# Patient Record
Sex: Male | Born: 1940
Health system: Southern US, Community
[De-identification: ages and names within clinical notes are randomized; demographics above are authoritative.]

## PROBLEM LIST (undated history)

## (undated) DIAGNOSIS — E785 Hyperlipidemia, unspecified: Secondary | ICD-10-CM

## (undated) DIAGNOSIS — D649 Anemia, unspecified: Secondary | ICD-10-CM

## (undated) DIAGNOSIS — R972 Elevated prostate specific antigen [PSA]: Secondary | ICD-10-CM

## (undated) DIAGNOSIS — K5790 Diverticulosis of intestine, part unspecified, without perforation or abscess without bleeding: Secondary | ICD-10-CM

## (undated) HISTORY — DX: Anemia, unspecified: D64.9

## (undated) HISTORY — DX: Gilbert syndrome: E80.4

## (undated) HISTORY — DX: Elevated prostate specific antigen (PSA): R97.20

## (undated) HISTORY — DX: Hyperlipidemia, unspecified: E78.5

## (undated) HISTORY — DX: Diverticulosis of intestine, part unspecified, without perforation or abscess without bleeding: K57.90

---

## 1979-06-25 HISTORY — PX: LUMBAR LAMINECTOMY: SHX95

## 2003-06-25 DIAGNOSIS — K5790 Diverticulosis of intestine, part unspecified, without perforation or abscess without bleeding: Secondary | ICD-10-CM

## 2003-06-25 HISTORY — PX: COLONOSCOPY: SHX174

## 2003-06-25 HISTORY — PX: GANGLION CYST EXCISION: SHX1691

## 2003-06-25 HISTORY — DX: Diverticulosis of intestine, part unspecified, without perforation or abscess without bleeding: K57.90

## 2003-09-27 ENCOUNTER — Encounter (INDEPENDENT_AMBULATORY_CARE_PROVIDER_SITE_OTHER): Payer: Self-pay | Admitting: Specialist

## 2003-09-27 ENCOUNTER — Ambulatory Visit (HOSPITAL_COMMUNITY): Admission: RE | Admit: 2003-09-27 | Discharge: 2003-09-27 | Payer: Self-pay | Admitting: Orthopedic Surgery

## 2003-09-27 ENCOUNTER — Ambulatory Visit (HOSPITAL_BASED_OUTPATIENT_CLINIC_OR_DEPARTMENT_OTHER): Admission: RE | Admit: 2003-09-27 | Discharge: 2003-09-27 | Payer: Self-pay | Admitting: Orthopedic Surgery

## 2004-04-26 ENCOUNTER — Ambulatory Visit: Payer: Self-pay | Admitting: Internal Medicine

## 2005-05-02 ENCOUNTER — Ambulatory Visit: Payer: Self-pay | Admitting: Internal Medicine

## 2005-05-08 ENCOUNTER — Ambulatory Visit: Payer: Self-pay | Admitting: Internal Medicine

## 2005-05-29 ENCOUNTER — Ambulatory Visit: Payer: Self-pay | Admitting: Internal Medicine

## 2005-07-23 ENCOUNTER — Ambulatory Visit: Payer: Self-pay | Admitting: Internal Medicine

## 2005-08-20 ENCOUNTER — Ambulatory Visit: Payer: Self-pay | Admitting: Internal Medicine

## 2006-04-07 ENCOUNTER — Ambulatory Visit: Payer: Self-pay | Admitting: Internal Medicine

## 2006-04-18 ENCOUNTER — Ambulatory Visit: Payer: Self-pay | Admitting: Internal Medicine

## 2006-08-26 ENCOUNTER — Ambulatory Visit: Payer: Self-pay | Admitting: Internal Medicine

## 2006-08-26 LAB — CONVERTED CEMR LAB
Albumin: 3.8 g/dL (ref 3.5–5.2)
Alkaline Phosphatase: 32 units/L — ABNORMAL LOW (ref 39–117)
Total Protein: 6.5 g/dL (ref 6.0–8.3)

## 2007-04-07 ENCOUNTER — Ambulatory Visit: Payer: Self-pay | Admitting: Internal Medicine

## 2007-05-15 ENCOUNTER — Telehealth (INDEPENDENT_AMBULATORY_CARE_PROVIDER_SITE_OTHER): Payer: Self-pay | Admitting: *Deleted

## 2007-06-24 ENCOUNTER — Ambulatory Visit: Payer: Self-pay | Admitting: Internal Medicine

## 2007-06-24 DIAGNOSIS — E1169 Type 2 diabetes mellitus with other specified complication: Secondary | ICD-10-CM | POA: Insufficient documentation

## 2007-06-24 DIAGNOSIS — E782 Mixed hyperlipidemia: Secondary | ICD-10-CM

## 2007-06-24 DIAGNOSIS — R972 Elevated prostate specific antigen [PSA]: Secondary | ICD-10-CM

## 2007-06-24 DIAGNOSIS — K5730 Diverticulosis of large intestine without perforation or abscess without bleeding: Secondary | ICD-10-CM | POA: Insufficient documentation

## 2007-06-24 DIAGNOSIS — R9431 Abnormal electrocardiogram [ECG] [EKG]: Secondary | ICD-10-CM

## 2007-07-15 ENCOUNTER — Encounter (INDEPENDENT_AMBULATORY_CARE_PROVIDER_SITE_OTHER): Payer: Self-pay | Admitting: *Deleted

## 2007-07-15 ENCOUNTER — Ambulatory Visit: Payer: Self-pay | Admitting: Internal Medicine

## 2007-09-22 ENCOUNTER — Ambulatory Visit: Payer: Self-pay | Admitting: Internal Medicine

## 2007-09-22 LAB — CONVERTED CEMR LAB
Bilirubin, Direct: 0.2 mg/dL (ref 0.0–0.3)
Eosinophils Absolute: 0.5 10*3/uL (ref 0.0–0.7)
Eosinophils Relative: 8.1 % — ABNORMAL HIGH (ref 0.0–5.0)
HCT: 39.5 % (ref 39.0–52.0)
HDL: 44.7 mg/dL (ref >39.0)
Hgb A1c MFr Bld: 6 % (ref 4.6–6.0)
MCV: 88.8 fL (ref 78.0–100.0)
Monocytes Absolute: 0.7 10*3/uL (ref 0.1–1.0)
Neutro Abs: 3.3 10*3/uL (ref 1.4–7.7)
Platelets: 208 10*3/uL (ref 150–400)
RDW: 14.9 % — ABNORMAL HIGH (ref 11.5–14.6)
Total Bilirubin: 1 mg/dL (ref 0.3–1.2)
Total Protein: 6.9 g/dL (ref 6.0–8.3)
Triglycerides: 41 mg/dL (ref 0–149)

## 2007-09-29 ENCOUNTER — Ambulatory Visit: Payer: Self-pay | Admitting: Internal Medicine

## 2007-10-02 LAB — CONVERTED CEMR LAB
Folate: >20 ng/mL
Saturation Ratios: 26.7 % (ref 20.0–50.0)
Vitamin B-12: 677 pg/mL (ref 211–911)

## 2007-10-05 ENCOUNTER — Encounter (INDEPENDENT_AMBULATORY_CARE_PROVIDER_SITE_OTHER): Payer: Self-pay | Admitting: *Deleted

## 2007-11-17 ENCOUNTER — Encounter: Payer: Self-pay | Admitting: Internal Medicine

## 2008-03-31 ENCOUNTER — Ambulatory Visit: Payer: Self-pay | Admitting: Internal Medicine

## 2008-04-15 ENCOUNTER — Ambulatory Visit: Payer: Self-pay | Admitting: Internal Medicine

## 2008-04-16 LAB — CONVERTED CEMR LAB
Basophils Absolute: 0 10*3/uL (ref 0.0–0.1)
Eosinophils Absolute: 0.4 10*3/uL (ref 0.0–0.7)
HCT: 40.9 % (ref 39.0–52.0)
MCHC: 35.5 g/dL (ref 30.0–36.0)
Monocytes Absolute: 0.6 10*3/uL (ref 0.1–1.0)
Monocytes Relative: 10.1 % (ref 3.0–12.0)
Neutro Abs: 3.3 10*3/uL (ref 1.4–7.7)
Platelets: 183 10*3/uL (ref 150–400)
RDW: 13 % (ref 11.5–14.6)

## 2008-04-19 ENCOUNTER — Encounter (INDEPENDENT_AMBULATORY_CARE_PROVIDER_SITE_OTHER): Payer: Self-pay | Admitting: *Deleted

## 2008-09-22 ENCOUNTER — Ambulatory Visit: Payer: Self-pay | Admitting: Internal Medicine

## 2008-09-22 DIAGNOSIS — Z862 Personal history of diseases of the blood and blood-forming organs and certain disorders involving the immune mechanism: Secondary | ICD-10-CM | POA: Insufficient documentation

## 2008-09-27 ENCOUNTER — Encounter (INDEPENDENT_AMBULATORY_CARE_PROVIDER_SITE_OTHER): Payer: Self-pay | Admitting: *Deleted

## 2008-09-28 ENCOUNTER — Encounter (INDEPENDENT_AMBULATORY_CARE_PROVIDER_SITE_OTHER): Payer: Self-pay | Admitting: *Deleted

## 2008-10-05 ENCOUNTER — Ambulatory Visit: Payer: Self-pay | Admitting: Internal Medicine

## 2008-10-05 LAB — CONVERTED CEMR LAB
OCCULT 1: NEGATIVE
OCCULT 3: NEGATIVE

## 2008-10-06 ENCOUNTER — Encounter (INDEPENDENT_AMBULATORY_CARE_PROVIDER_SITE_OTHER): Payer: Self-pay | Admitting: *Deleted

## 2008-11-17 ENCOUNTER — Encounter: Payer: Self-pay | Admitting: Internal Medicine

## 2009-03-22 ENCOUNTER — Ambulatory Visit: Payer: Self-pay | Admitting: Internal Medicine

## 2009-10-05 ENCOUNTER — Ambulatory Visit: Payer: Self-pay | Admitting: Internal Medicine

## 2009-10-05 DIAGNOSIS — M720 Palmar fascial fibromatosis [Dupuytren]: Secondary | ICD-10-CM | POA: Insufficient documentation

## 2009-10-24 ENCOUNTER — Ambulatory Visit: Payer: Self-pay | Admitting: Internal Medicine

## 2009-10-24 LAB — CONVERTED CEMR LAB: OCCULT 2: NEGATIVE

## 2009-10-25 ENCOUNTER — Encounter (INDEPENDENT_AMBULATORY_CARE_PROVIDER_SITE_OTHER): Payer: Self-pay | Admitting: *Deleted

## 2009-11-22 ENCOUNTER — Encounter: Payer: Self-pay | Admitting: Internal Medicine

## 2010-01-04 ENCOUNTER — Ambulatory Visit: Payer: Self-pay | Admitting: Internal Medicine

## 2010-01-04 DIAGNOSIS — R42 Dizziness and giddiness: Secondary | ICD-10-CM

## 2010-01-08 LAB — CONVERTED CEMR LAB
Basophils Relative: 0.6 % (ref 0.0–3.0)
Eosinophils Absolute: 0.2 10*3/uL (ref 0.0–0.7)
HCT: 43 % (ref 39.0–52.0)
HDL: 42.6 mg/dL (ref >39.00)
Hemoglobin: 15.3 g/dL (ref 13.0–17.0)
Lymphocytes Relative: 17.3 % (ref 12.0–46.0)
Lymphs Abs: 1.2 10*3/uL (ref 0.7–4.0)
MCHC: 35.5 g/dL (ref 30.0–36.0)
Monocytes Relative: 9.5 % (ref 3.0–12.0)
Neutro Abs: 4.6 10*3/uL (ref 1.4–7.7)
RBC: 4.39 M/uL (ref 4.22–5.81)
Total Bilirubin: 1.4 mg/dL — ABNORMAL HIGH (ref 0.3–1.2)
Total CHOL/HDL Ratio: 3
VLDL: 11 mg/dL (ref 0.0–40.0)

## 2010-01-16 ENCOUNTER — Encounter: Payer: Self-pay | Admitting: Internal Medicine

## 2010-03-27 ENCOUNTER — Ambulatory Visit: Payer: Self-pay | Admitting: Internal Medicine

## 2010-06-13 ENCOUNTER — Telehealth (INDEPENDENT_AMBULATORY_CARE_PROVIDER_SITE_OTHER): Payer: Self-pay | Admitting: *Deleted

## 2010-07-22 LAB — CONVERTED CEMR LAB
ALT: 20 units/L (ref 0–53)
ALT: 22 units/L (ref 0–53)
AST: 32 units/L (ref 0–37)
Albumin: 4 g/dL (ref 3.5–5.2)
Alkaline Phosphatase: 35 units/L — ABNORMAL LOW (ref 39–117)
BUN: 20 mg/dL (ref 6–23)
Basophils Absolute: 0 10*3/uL (ref 0.0–0.1)
Basophils Relative: 0.5 % (ref 0.0–3.0)
Bilirubin, Direct: 0.2 mg/dL (ref 0.0–0.3)
Bilirubin, Direct: 0.3 mg/dL (ref 0.0–0.3)
Blood in Urine, dipstick: NEGATIVE
CO2: 29 meq/L (ref 19–32)
Calcium: 9 mg/dL (ref 8.4–10.5)
Chloride: 106 meq/L (ref 96–112)
Cholesterol: 115 mg/dL (ref 0–200)
Creatinine, Ser: 1.1 mg/dL (ref 0.4–1.5)
Eosinophils Absolute: 0.3 10*3/uL (ref 0.0–0.6)
Eosinophils Absolute: 0.3 10*3/uL (ref 0.0–0.7)
Eosinophils Relative: 3.5 % (ref 0.0–5.0)
Eosinophils Relative: 4.2 % (ref 0.0–5.0)
Eosinophils Relative: 5.9 % — ABNORMAL HIGH (ref 0.0–5.0)
GFR calc Af Amer: 86 mL/min
GFR calc non Af Amer: 63.39 mL/min (ref >60)
GFR calc non Af Amer: 71 mL/min
Glucose, Bld: 102 mg/dL — ABNORMAL HIGH (ref 70–99)
Glucose, Bld: 107 mg/dL — ABNORMAL HIGH (ref 70–99)
Glucose, Urine, Semiquant: NEGATIVE
HCT: 34.7 % — ABNORMAL LOW (ref 39.0–52.0)
HCT: 40.8 % (ref 39.0–52.0)
HCT: 41 % (ref 39.0–52.0)
HDL goal, serum: 40 mg/dL
HDL: 46.2 mg/dL (ref >39.00)
Hemoglobin: 14.1 g/dL (ref 13.0–17.0)
Hgb A1c MFr Bld: 5.7 % (ref 4.6–6.5)
LDL Goal: 160 mg/dL
Lymphocytes Relative: 12.6 % (ref 12.0–46.0)
Lymphocytes Relative: 22.3 % (ref 12.0–46.0)
Lymphs Abs: 1.1 10*3/uL (ref 0.7–4.0)
Lymphs Abs: 1.3 10*3/uL (ref 0.7–4.0)
MCHC: 35.3 g/dL (ref 30.0–36.0)
MCV: 87.6 fL (ref 78.0–100.0)
MCV: 98.1 fL (ref 78.0–100.0)
Monocytes Absolute: 0.7 10*3/uL (ref 0.1–1.0)
Monocytes Relative: 9.2 % (ref 3.0–12.0)
Neutro Abs: 3.2 10*3/uL (ref 1.4–7.7)
Neutro Abs: 6.4 10*3/uL (ref 1.4–7.7)
Neutrophils Relative %: 58.3 % (ref 43.0–77.0)
Neutrophils Relative %: 64.2 % (ref 43.0–77.0)
Nitrite: NEGATIVE
Platelets: 198 10*3/uL (ref 150.0–400.0)
Platelets: 216 10*3/uL (ref 150–400)
Potassium: 4.2 meq/L (ref 3.5–5.1)
Potassium: 4.2 meq/L (ref 3.5–5.1)
Potassium: 4.7 meq/L (ref 3.5–5.1)
Protein, U semiquant: NEGATIVE
RBC: 4.1 M/uL — ABNORMAL LOW (ref 4.22–5.81)
RDW: 13.3 % (ref 11.5–14.6)
Sodium: 143 meq/L (ref 135–145)
Sodium: 143 meq/L (ref 135–145)
TSH: 1.56 microintl units/mL (ref 0.35–5.50)
TSH: 1.79 microintl units/mL (ref 0.35–5.50)
Total Bilirubin: 1.6 mg/dL — ABNORMAL HIGH (ref 0.3–1.2)
Total CHOL/HDL Ratio: 2
Total Protein: 7 g/dL (ref 6.0–8.3)
Triglycerides: 37 mg/dL (ref 0.0–149.0)
Triglycerides: 59 mg/dL (ref 0–149)
VLDL: 11.6 mg/dL (ref 0.0–40.0)
WBC Urine, dipstick: NEGATIVE
WBC: 5.5 10*3/uL (ref 4.5–10.5)
WBC: 8.6 10*3/uL (ref 4.5–10.5)
pH: 5

## 2010-07-26 NOTE — Letter (Signed)
Summary: Results Follow up Letter  [facility]  [address] [place], [place] [address]   Phone: [phone]  Fax: [phone]    10/25/2009 MRN: [id number]  Walter Hess [address] [place], [place]  [address]  Dear Walter Hess,  The following are the results of your recent test(s):  Test         Result    Pap Smear:        Normal _____  Not Normal _____ Comments: ______________________________________________________ Cholesterol: LDL(Bad cholesterol):         Your goal is less than:         HDL (Good cholesterol):       Your goal is more than: Comments:  ______________________________________________________ Mammogram:        Normal _____  Not Normal _____ Comments:  ___________________________________________________________________ Hemoccult:        Normal __X___  Not normal _______ Comments:    _____________________________________________________________________ Other Tests:    We routinely do not discuss normal results over the telephone.  If you desire a copy of the results, or you have any questions about this information we can discuss them at your next office visit.   Sincerely,

## 2010-07-26 NOTE — Progress Notes (Signed)
Summary: need lab orders for 10/11/2010--added   Phone Note Call from Patient   Summary of Call: has Dr Alwyn Ren appt on 10/17/2009,  has lab appt on 10/11/2010---I need lab orders and codes please      thanks  (advised him to check with his ins co to make sure labs are covered before the CPX) Initial call taken by: Jerolyn Shin,  June 13, 2010 2:48 PM  Follow-up for Phone Call        TLB-Lipid Panel (80061-LIPID) TLB-BMP (Basic Metabolic Panel-BMET) (80048-METABOL) TLB-CBC Platelet - w/Differential (85025-CBCD) TLB-Hepatic/Liver Function Pnl (80076-HEPATIC) TLB-TSH (Thyroid Stimulating Hormone) (84443-TSH) UA Dipstick w/o Micro (manual) (16109) Stool Cards  v70.0/272.2/995.20 Follow-up by: Shonna Chock CMA,  June 14, 2010 9:07 AM  Additional Follow-up for Phone Call Additional follow up Details #1::        added info for lab-10/11/2010 Additional Follow-up by: Jerolyn Shin,  June 14, 2010 11:45 AM

## 2010-07-26 NOTE — Assessment & Plan Note (Signed)
Summary: cpx- jr   Vital Signs:  Patient profile:   70 year old male Height:      70.75 inches Weight:      166.6 pounds BMI:     23.49 Temp:     98.4 degrees F oral Pulse rate:   60 / minute Resp:     16 per minute BP sitting:   140 / 78  (left arm) Cuff size:   regular  Vitals Entered By: Shonna Chock (October 05, 2009 9:26 AM)  Comments REVIEWED MED LIST, PATIENT AGREED DOSE AND INSTRUCTION CORRECT    History of Present Illness: Walter Hess is here for a physical ; he is asymptomatic. Preventive healthcare interventions reviewed ; all up to date except Living Will & POA.  Allergies (verified): No Known Drug Allergies  Past History:  Past Medical History: Elevated PSA, Dr Logan Bores, [facility] ; NS ST-T changes aVL & V4-6, improved serially; Gilbert's Syndrome Diverticulosis, colon 2005, Dr Juanda Chance Hyperlipidemia: LDL goal = < 100, ideally < 75 based on NMR Lipoprofile Anemia-NOS,former  blood donor for decades  Past Surgical History: Colonoscopy :Diverticulosis  2005, Dr Juanda Chance, due 2015 Lumbar laminectomy; Ganglion cystectomy 5th L DIP, 2005  Family History: Father: MI @ 20, former smoker Mother: Alsheimer's Siblings: sister: open heart surgery; P aunt: DM; P uncle: MI in 82s ; PGF :CAD; MGF :prostate cancer; M uncle: prostate cancer  Social History: No diet Retired Chief of Staff Married Former Smoker: quit in  1960s Alcohol use-yes: occasionally Regular exercise-yes: daily as walking 20 min  Review of Systems  The patient denies anorexia, fever, weight loss, weight gain, vision loss, decreased hearing, hoarseness, chest pain, syncope, dyspnea on exertion, peripheral edema, prolonged cough, headaches, hemoptysis, abdominal pain, melena, hematochezia, severe indigestion/heartburn, hematuria, incontinence, suspicious skin lesions, depression, unusual weight change, abnormal bleeding, enlarged lymph nodes, and angioedema.         PSA 0.72 in 10/2008  Physical  Exam  General:  well-nourished; alert,appropriate and cooperative throughout examination Head:  Normocephalic and atraumatic without obvious abnormalities. No apparent alopecia  Eyes:  No corneal or conjunctival inflammation noted. Perrla. Funduscopic exam benign, without hemorrhages, exudates or papilledema.  Ears:  External ear exam shows no significant lesions or deformities.  Otoscopic examination reveals clear canals, tympanic membranes are intact bilaterally without bulging, retraction, inflammation or discharge. Hearing is grossly normal bilaterally. Nose:  External nasal examination shows no deformity or inflammation. Nasal mucosa are pink and moist without lesions or exudates. Septum to L Mouth:  Oral mucosa and oropharynx without lesions or exudates.  Teeth in good repair. Neck:  No deformities, masses, or tenderness noted. Lungs:  Normal respiratory effort, chest expands symmetrically. Lungs are clear to auscultation, no crackles or wheezes. Heart:  Normal rate and regular rhythm. S1 and S2 normal without gallop, murmur, click, rub .S4 with slurring Abdomen:  Bowel sounds positive,abdomen soft and non-tender without masses, organomegaly or hernias noted. Rectal:  Dr Logan Bores Genitalia:  Dr Logan Bores Prostate:  Dr Logan Bores Msk:  No deformity or scoliosis noted of thoracic or lumbar spine.   Pulses:  R and L carotid,radial,dorsalis pedis and posterior tibial pulses are full and equal bilaterally Extremities:  No clubbing, cyanosis, edema.Normal full range of motion of all joints.  Crepitus of knees . Dupuytren's contracture R palm Neurologic:  alert & oriented X3 and DTRs symmetrical and normal.   Skin:  Intact without suspicious lesions or rashes. Mild bruising Cervical Nodes:  No lymphadenopathy noted Axillary Nodes:  No palpable lymphadenopathy  Psych:  memory intact for recent and remote, normally interactive, and good eye contact.     Impression & Recommendations:  Problem # 1:   PREVENTIVE HEALTH CARE (ICD-V70.0)  Orders: EKG w/ Interpretation (93000) Venipuncture (04540) TLB-Lipid Panel (80061-LIPID) TLB-BMP (Basic Metabolic Panel-BMET) (80048-METABOL) TLB-CBC Platelet - w/Differential (85025-CBCD) TLB-Hepatic/Liver Function Pnl (80076-HEPATIC) TLB-TSH (Thyroid Stimulating Hormone) (84443-TSH) UA Dipstick w/o Micro (manual) (98119)  Problem # 2:  HYPERLIPIDEMIA (ICD-272.2)  His updated medication list for this problem includes:    Pravastatin Sodium 40 Mg Tabs (Pravastatin sodium) .Marland Kitchen... 1 qhs  Orders: EKG w/ Interpretation (93000) Venipuncture (14782) TLB-Lipid Panel (80061-LIPID) Prescription Created Electronically [phone])  Problem # 3:  NONSPECIFIC ABNORMAL ELECTROCARDIOGRAM (ICD-794.31)  Improved serially  Orders: EKG w/ Interpretation (93000)  Problem # 4:  PROSTATE SPECIFIC ANTIGEN( PSA), ELEVATED (ICD-790.93) as per Dr Logan Bores  Problem # 5:  DIVERTICULOSIS, COLON (ICD-562.10)  Problem # 6:  DUPUYTREN'S CONTRACTURE, RIGHT (ICD-728.6)  Complete Medication List: 1)  Pravastatin Sodium 40 Mg Tabs (Pravastatin sodium) .Marland Kitchen.. 1 qhs 2)  Centrum Silver Tabs (Multiple vitamins-minerals) .Marland Kitchen.. 1 by mouth once daily 3)  Fish Oil 1200 Mg Caps (Omega-3 fatty acids) .Marland Kitchen.. 1 by mouth once daily 4)  L-lysine Hcl 500 Mg Caps (Lysine hcl) .Marland Kitchen.. 1 by mouth once daily 5)  Saw Palmetto 450 Mg Caps (Saw palmetto (serenoa repens)) .Marland Kitchen.. 1 by mouth once daily 6)  Sam-e 200 Mg Tbec (S-adenosylmethionine) .Marland Kitchen.. 1 by mouth once daily 7)  Flax Seed Oil 1000 Mg Caps (Flaxseed (linseed)) .Marland Kitchen.. 1 by mouth once daily 8)  Adult Aspirin Low Strength 81 Mg Tbdp (Aspirin) .Marland Kitchen.. 1 by mouth once daily 9)  D 1000 Plus Tabs (Fa-cyanocobalamin-b6-d-ca) .Marland Kitchen.. 1 by mouth once daily 10)  Turmeric 500mg   .... 1 by mouth once daily 11)  Resveratrol 200mg   .... 1 by mouth once daily  Other Orders: Pneumococcal Vaccine (30865) Admin 1st Vaccine (78469)  Patient Instructions: 1)  Schedule  a colonoscopy as per Dr Juanda Chance. Complete stool cards. 2)  Choose your Health care Power of Attorney and/or prepare a Living Will as discussed. Prescriptions: PRAVASTATIN SODIUM 40 MG  TABS (PRAVASTATIN SODIUM) 1 qhs  #90 Tablet x 3   Entered and Authorized by:   Marga Melnick MD   Signed by:   Marga Melnick MD on 10/05/2009   Method used:   Faxed to ...       [facility]  [phone]* (retail)       [address] [place], [place]  [address]       Ph: [phone] or [phone]       Fax: [phone]   RxID:   [id number]    Immunizations Administered:  Pneumonia Vaccine:    Vaccine Type: Pneumovax (Medicare)    Site: right deltoid    Mfr: Merck    Dose: 0.5 ml    Route: IM    Given by: Shonna Chock    Exp. Date: 06/02/2010    Lot #: [id number]   Laboratory Results   Urine Tests   Date/Time Reported: October 05, 2009 11:23 AM   Routine Urinalysis   Color: yellow Appearance: Clear Glucose: negative   (Normal Range: Negative) Bilirubin: negative   (Normal Range: Negative) Ketone: trace (5)   (Normal Range: Negative) Spec. Gravity: 1.025   (Normal Range: 1.003-1.035) Blood: negative   (Normal Range: Negative) pH: 5.0   (Normal Range: 5.0-8.0) Protein: negative   (Normal Range: Negative) Urobilinogen: negative   (  Normal Range: 0-1) Nitrite: negative   (Normal Range: Negative) Leukocyte Esterace: negative   (Normal Range: Negative)    Comments: Floydene Flock  October 05, 2009 11:23 AM

## 2010-07-26 NOTE — Medication Information (Signed)
Summary: Nonadherence with Pravastatin/[facility]  Nonadherence with Pravastatin/[facility]   Imported By: Lanelle Bal 01/29/2010 12:32:31  _____________________________________________________________________  External Attachment:    Type:   Image     Comment:   External Document

## 2010-07-26 NOTE — Assessment & Plan Note (Signed)
Summary: vertigo/ send to lab lipid:272.4/cbs   Vital Signs:  Patient profile:   70 year old male Weight:      164.6 pounds BMI:     23.20 O2 Sat:      98 % Temp:     98.2 degrees F oral Pulse rate:   71 / minute Resp:     14 per minute BP supine:   130 / 80  (left arm) BP sitting:   132 / 80  (left arm) BP standing:   138 / 80  (left arm) Cuff size:   regular  Vitals Entered By: Shonna Chock CMA (January 04, 2010 8:55 AM) CC: 1.) 2 Episodes of dizziness: A) May 2011  B) Last week in the barbershop: dizzy/nausea  2.) Labs-lipids and any other labs related to today's concern, Syncope Comments REVIEWED MED LIST, PATIENT AGREED DOSE AND INSTRUCTION CORRECT  Pulse supine: 64, Pulse standing: 68   CC:  1.) 2 Episodes of dizziness: A) May 2011  B) Last week in the barbershop: dizzy/nausea  2.) Labs-lipids and any other labs related to today's concern and Syncope.  History of Present Illness:        This is a 70 year old man who presents with  2 episodes of positional lightheadedness followed by n&v.  The patient reports lightheadedness w/o  denies near or frank  loss of consciousness, premonitory symptoms,  palpitations, chest pain, shortness of breath, seizures, or  incontinence.  Associated symptoms include nausea, vomiting, feeling warm, and diaphoresis.  The patient denies the following symptoms: headache, abdominal discomfort, pallor, focal weakness, blurred vision, perioral numbness, bite injury of tongue, witnessed limb jerking, and witnessed confusion .  These  symptoms occurred only  in the setting of supine position.  #1 episode was  11/02/2009 with mild symptoms  while supine & severe dizziness w/o vertigo after standing  @ [employer] for crown under local anesthesia. #2 was 07/06 with shampooing hair hair in supine position @ [employer]; symptoms worse post standing. Second episode less severe.No BPV symptoms.  Allergies (verified): No Known Drug Allergies  Review of Systems Eyes:   Denies blurring, double vision, and vision loss-both eyes. ENT:  Denies decreased hearing and ringing in ears. Neuro:  Denies difficulty with concentration, numbness, sensation of room spinning, tingling, and tremors.  Physical Exam  General:  well-nourished,in no acute distress; alert,appropriate and cooperative throughout examination Eyes:  No corneal or conjunctival inflammation noted. EOMI. Perrla. Field of  Vision grossly normal. Ears:  External ear exam shows no significant lesions or deformities.  Otoscopic examination reveals clear canals, tympanic membranes are intact bilaterally without bulging, retraction, inflammation or discharge. Hearing is grossly normal bilaterally. Mouth:  Oral mucosa and oropharynx without lesions or exudates.  Teeth in good repair. Heart:  Normal rate and regular rhythm.  S2 normal without gallop, murmur, click, rub .S4 vs split S1 Pulses:  R and L carotid,radial  pulses are full and equal bilaterally. No carotid bruits Extremities:  Minor OA changes  Neurologic:  alert & oriented X3, cranial nerves II-XII intact, strength normal in all extremities, sensation intact to light touch, gait normal, DTRs symmetrical and normal, finger-to-nose normal, heel-to-shin normal, and Romberg negative.   Skin:  Intact without suspicious lesions or rashes Cervical Nodes:  No lymphadenopathy noted Axillary Nodes:  No palpable lymphadenopathy Psych:  memory intact for recent and remote, normally interactive, and good eye contact.     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 1 by mouth m/w/f  Orders: Venipuncture (96295) TLB-Lipid Panel  (80061-LIPID) TLB-Hepatic/Liver Function Pnl (80076-HEPATIC)  Complete Medication List: 1)  Pravastatin Sodium 40 Mg Tabs (Pravastatin sodium) .Marland Kitchen.. 1 by mouth m/w/f 2)  Centrum Silver Tabs (Multiple vitamins-minerals) .Marland Kitchen.. 1 by mouth once daily 3)  Fish Oil 1200 Mg Caps (Omega-3 fatty acids) .Marland Kitchen.. 1 by mouth once daily 4)  L-lysine Hcl 500 Mg Caps (Lysine hcl) .Marland Kitchen.. 1 by mouth once daily 5)  Saw Palmetto 450 Mg Caps (Saw palmetto (serenoa repens)) .Marland Kitchen.. 1 by mouth once daily 6)  Sam-e 200 Mg Tbec (S-adenosylmethionine) .Marland Kitchen.. 1 by mouth once daily 7)  Flax Seed Oil 1000 Mg Caps (Flaxseed (linseed)) .Marland Kitchen.. 1 by mouth once daily 8)  Adult Aspirin Low Strength 81 Mg Tbdp (Aspirin) .Marland Kitchen.. 1 by mouth once daily 9)  D 1000 Plus Tabs (Fa-cyanocobalamin-b6-d-ca) .Marland Kitchen.. 1 by mouth once daily 10)  Turmeric 500mg   .... 1 by mouth once daily 11)  Resveratrol 200mg   .... 1 by mouth once daily  Patient Instructions: 1)  Avoid Triggering Position for Vertebrobasilar  Insufficiency; semi recumbancy may be safe.

## 2010-07-26 NOTE — Letter (Signed)
Summary: [facility]  [facility]   Imported By: Lanelle Bal 11/30/2009 13:24:23  _____________________________________________________________________  External Attachment:    Type:   Image     Comment:   External Document

## 2010-07-26 NOTE — Assessment & Plan Note (Signed)
Summary: FLU SHOT///[facility]  Nurse Visit  CC: Flu shot./kb   Allergies: No Known Drug Allergies  Orders Added: 1)  Flu Vaccine 6yrs + MEDICARE PATIENTS [Q2039] 2)  Administration Flu vaccine - MCR [G0008]          Flu Vaccine Consent Questions     Do you have a history of severe allergic reactions to this vaccine? no    Any prior history of allergic reactions to egg and/or gelatin? no    Do you have a sensitivity to the preservative Thimersol? no    Do you have a past history of Guillan-Barre Syndrome? no    Do you currently have an acute febrile illness? no    Have you ever had a severe reaction to latex? no    Vaccine information given and explained to patient? yes    Are you currently pregnant? no    Lot Number:[id number]   Exp Date:12/22/2010   Site Given  Left Deltoid IM1

## 2010-07-26 NOTE — Letter (Signed)
Summary: [facility]  [facility]   Imported By: Lanelle Bal 12/09/2009 11:42:49  _____________________________________________________________________  External Attachment:    Type:   Image     Comment:   External Document

## 2010-08-29 ENCOUNTER — Encounter: Payer: Self-pay | Admitting: Internal Medicine

## 2010-10-04 ENCOUNTER — Other Ambulatory Visit (INDEPENDENT_AMBULATORY_CARE_PROVIDER_SITE_OTHER): Payer: Medicare Other

## 2010-10-04 DIAGNOSIS — E782 Mixed hyperlipidemia: Secondary | ICD-10-CM

## 2010-10-04 DIAGNOSIS — T887XXA Unspecified adverse effect of drug or medicament, initial encounter: Secondary | ICD-10-CM

## 2010-10-04 DIAGNOSIS — Z0000 Encounter for general adult medical examination without abnormal findings: Secondary | ICD-10-CM

## 2010-10-04 LAB — CBC WITH DIFFERENTIAL/PLATELET
Basophils Absolute: 0 10*3/uL (ref 0.0–0.1)
Eosinophils Absolute: 0.3 10*3/uL (ref 0.0–0.7)
HCT: 38.5 % — ABNORMAL LOW (ref 39.0–52.0)
Lymphocytes Relative: 15.1 % (ref 12.0–46.0)
Lymphs Abs: 1.3 10*3/uL (ref 0.7–4.0)
MCHC: 34.8 g/dL (ref 30.0–36.0)
Monocytes Relative: 7.8 % (ref 3.0–12.0)
Platelets: 293 10*3/uL (ref 150.0–400.0)
RDW: 12.9 % (ref 11.5–14.6)

## 2010-10-04 LAB — POCT URINALYSIS DIPSTICK
Bilirubin, UA: NEGATIVE
Blood, UA: NEGATIVE
Glucose, UA: NEGATIVE
Leukocytes, UA: NEGATIVE
Nitrite, UA: NEGATIVE
Urobilinogen, UA: NEGATIVE

## 2010-10-04 LAB — LIPID PANEL: HDL: 36.6 mg/dL — ABNORMAL LOW (ref >39.00)

## 2010-10-04 LAB — HEPATIC FUNCTION PANEL
AST: 22 U/L (ref 0–37)
Total Bilirubin: 0.9 mg/dL (ref 0.3–1.2)

## 2010-10-04 LAB — BASIC METABOLIC PANEL
BUN: 17 mg/dL (ref 6–23)
CO2: 29 mEq/L (ref 19–32)
Calcium: 8.9 mg/dL (ref 8.4–10.5)
GFR: 79.85 mL/min (ref >60.00)
Glucose, Bld: 101 mg/dL — ABNORMAL HIGH (ref 70–99)

## 2010-10-04 LAB — TSH: TSH: 2.38 u[IU]/mL (ref 0.35–5.50)

## 2010-10-08 LAB — HEMOGLOBIN A1C: Hgb A1c MFr Bld: 6 % (ref 4.6–6.5)

## 2010-10-11 ENCOUNTER — Other Ambulatory Visit: Payer: Self-pay

## 2010-10-18 ENCOUNTER — Encounter: Payer: Self-pay | Admitting: Internal Medicine

## 2010-10-30 ENCOUNTER — Encounter: Payer: Self-pay | Admitting: Internal Medicine

## 2010-11-01 ENCOUNTER — Ambulatory Visit (INDEPENDENT_AMBULATORY_CARE_PROVIDER_SITE_OTHER): Payer: Medicare Other | Admitting: Internal Medicine

## 2010-11-01 ENCOUNTER — Encounter: Payer: Self-pay | Admitting: Internal Medicine

## 2010-11-01 VITALS — BP 148/86 | HR 60 | Temp 98.3°F | Resp 12 | Ht 71.0 in | Wt 166.2 lb

## 2010-11-01 DIAGNOSIS — E782 Mixed hyperlipidemia: Secondary | ICD-10-CM

## 2010-11-01 DIAGNOSIS — Z0000 Encounter for general adult medical examination without abnormal findings: Secondary | ICD-10-CM

## 2010-11-01 DIAGNOSIS — D649 Anemia, unspecified: Secondary | ICD-10-CM

## 2010-11-01 DIAGNOSIS — R42 Dizziness and giddiness: Secondary | ICD-10-CM

## 2010-11-01 DIAGNOSIS — Z0100 Encounter for examination of eyes and vision without abnormal findings: Secondary | ICD-10-CM

## 2010-11-01 DIAGNOSIS — K5730 Diverticulosis of large intestine without perforation or abscess without bleeding: Secondary | ICD-10-CM

## 2010-11-01 NOTE — Patient Instructions (Addendum)
Preventive Health Care: Exercise at least 30-45 minutes a day,  3-4 days a week.  Eat a low-fat diet with lots of fruits and vegetables, up to 7-9 servings per day. Avoid obesity; your goal is waist measurement < 40 inches.Consume less than 40 grams of sugar per day from foods & drinks with High Fructose Corn Sugar as #2,3 or # 4 on label. Health Care Power of Attorney & Living Will. Complete if not in place ; these place you in charge of your health care decisions. Please complete stool cards; recheck the CBC and differential in 8-[redacted] weeks along with an A1c(285.9, 790.29) . Please do not donate blood before that recheck.

## 2010-11-01 NOTE — Assessment & Plan Note (Signed)
With neck hyperextension

## 2010-11-01 NOTE — Progress Notes (Signed)
Subjective:    Patient ID: Walter Hess, male    DOB: 1941/01/05, 70 y.o.   MRN: [id number]  HPI Medicare Wellness Visit:  The following psychosocial & medical history were reviewed as required by Medicare.   Social history: caffeine: 1 cup , alcohol:  <1 / day ,  tobacco use : quit 1965  & exercise : walking & calisthentics almost daily.   Home & personal  safety / fall risk: no issues, activities of daily living:  No limitations , seatbelt use : yes , and smoke alarm employment : yes .  Power of Attorney/Living Will status :  Not in place  Vision ( as recorded per Nurse) & Hearing  evaluation :  Decreased to whisper on L. Orientation :oriented x 3 , memory & recall : good,  math testing:  good,and mood & affect : normal . Depression / anxiety: denied Travel history : never , immunization status :up to date , transfusion history:  no, and preventive health surveillance ( colonoscopies, BMD , etc as per protocol/ SOC): yes, Dental care:  yes . Chart reviewed &  Updated. Active issues reviewed & addressed.       Review of Systems he has been treated by Dr. Magnus Ivan  with steroid injections for bilateral shoulder impingement syndrome. High dose nonsteroidals were not effective. Patient reports no  vision/ hearing changes,anorexia, weight change, fever ,adenopathy, persistant / recurrent hoarseness, swallowing issues, chest pain,palpitations, edema,persistant / recurrent cough, hemoptysis, dyspnea(rest, exertional, paroxysmal nocturnal), gastrointestinal  bleeding (melena, rectal bleeding), abdominal pain, excessive heart burn, GU symptoms( dysuria, hematuria, pyuria, voiding/incontinence  issues) syncope, focal weakness, memory loss,numbness & tingling, skin/hair/nail changes, or  abnormal bruising/bleeding.    Objective:   Physical Exam Gen.: Thin but healthy and well-nourished in appearance. Alert, appropriate and cooperative throughout exam. Head: Normocephalic without obvious  abnormalities. Eyes: No corneal or conjunctival inflammation noted. Pupils equal round reactive to light and accommodation. Fundal exam is benign without hemorrhages, exudate, papilledema. Extraocular motion intact. Ears: External  ear exam reveals no significant lesions or deformities. Canals clear .TMs normal.  Nose: External nasal exam reveals no deformity or inflammation. Nasal mucosa are pink and moist. No lesions or exudates noted. Septum   normal  Mouth: Oral mucosa and oropharynx reveal no lesions or exudates. Teeth in good repair. Neck: No deformities, masses, or tenderness noted. Range of motion  normal. Thyroid  normal. Lungs: Normal respiratory effort; chest expands symmetrically. Lungs are clear to auscultation without rales, wheezes, or increased work of breathing.Prominent xiphoid. Heart: Slowl rate and rhythm. Normal S1 and S2. No gallop, click, or rub. S4 with slurring w/o murmur. Abdomen: Bowel sounds normal; abdomen soft and nontender. No masses, organomegaly or hernias noted. Genitalia: Dr Logan Bores . Musculoskeletal/extremities: No deformity or scoliosis noted of  the thoracic or lumbar spine but mild asymmetry of thoracic muscles, R> L. No clubbing, cyanosis, edema, or deformity noted. Range of motion  normal .Tone & strength  Normal.Minor joint changes L 5th finger. Nail health  good. Vascular: Carotid, radial artery, dorsalis pedis and dorsalis posterior tibial pulses are full and equal. No bruits present. Neurologic: Alert and oriented x3. Deep tendon reflexes symmetrical and normal.         Skin: Intact without suspicious lesions or rashes. Lymph: No cervical, axillary, or inguinal lymphadenopathy present. Psych: Mood and affect are normal. Normally interactive  Assessment & Plan:  #1 Medicare wellness visit; criteria met and data entered.  #2 dyslipidemia; HDL is mildly reduced at  37.  #3 borderline anemia; normal iron, B12, and folate levels. He has taken nonsteroidals recently (see above). He has been a blood donor on a quarterly basis in past.  Plan: Stool cards will be completed to assess the borderline anemia.  An A1c would be recommended annually because of the borderline glucose.

## 2010-11-04 ENCOUNTER — Encounter: Payer: Self-pay | Admitting: Internal Medicine

## 2010-11-09 NOTE — Op Note (Signed)
NAME:  Walter Hess, Walter Hess                        ACCOUNT NO.:  [id number]   MEDICAL RECORD NO.:  [id number]                   PATIENT TYPE:  AMB   LOCATION:  DSC                                  FACILITY:  [facility]   PHYSICIAN:  Katy Fitch. Naaman Plummer., M.D.          DATE OF BIRTH:  09-16-1940   DATE OF PROCEDURE:  09/27/2003  DATE OF DISCHARGE:                                 OPERATIVE REPORT   PREOPERATIVE DIAGNOSES:  Mass left small finger nail plate and nail fold  consistent with possible subungual tumor versus mucous cyst deep to the nail  fold.   POSTOPERATIVE DIAGNOSES:  Probable mucous cyst leading to a defect in the  ventral nail fold left small finger and significant nail plate deformity.   OPERATION:  1. Wedge biopsy of the left small finger nail fold ventral.  2. Irrigation and debridement of left small finger DIP joint with removal of     marginal osteophytes on dorsal ulnar aspect of distal and middle     phalanges as well as sterile saline irrigation of the DIP joint followed     by reconstruction of the ventral nail fold with delayed primary closure.   SURGEON:  Katy Fitch. Sypher, M.D.   ASSISTANT:  Jonni Sanger, P.A.   ANESTHESIA:  0.25% Marcaine and 2% lidocaine metacarpal head level block  left small finger.  Supervision anesthesiologist, Quita Skye. Krista Blue, M.D.   INDICATIONS FOR PROCEDURE:  Walter Hess is a 70 year old man referred  by Dr. Karlyn Agee of [facility] for evaluation and  management of a swelling of the left small finger, nail plate and fold.   Clinical examination suggested a bulbous protuberance of the nail plate and  a discoloration beneath the nail fold that was consistent with a possible  tumor or other inflammatory lesions. We recommended prompt nail plate  excision followed by ventral nail fold biopsy and appropriate management.   After informed consent, he was brought to the operating room at this time.   Preoperatively, he was advised that this could represent an inflammatory or  neoplastic process. We could not provide a prognosis until we had a clear  diagnosis.  After informed consent, he is brought to the operating room.   DESCRIPTION OF PROCEDURE:  [name] is brought to the operating  room and placed in supine position on the operating table.   Mr. Griesinger declined sedation.  A 0.25% Marcaine and 2% lidocaine metacarpal  head level block was placed followed by routine Betadine scrub and paint of  the left arm.   A pneumatic tourniquet was applied to the proximal brachium.  When  anesthesia was satisfactory, the left small finger was exsanguinated with a  gauze wrap and a 0.25% inch Penrose drain was placed through the digital  tourniquet over P1.   The procedure commended with the removal of the nail plate. We immediately  recovered fluid consistent  with a ruptured mucous cyst. This was cultured  for aerobic growth.   There was an area of inflamed heaped up ventral nail fold. This was resected  with a wedge biopsy that was sent off in formalin for pathologic evaluation.  It appeared that a mucous cyst was tracking deep to the entire ventral nail  fold and presenting through the middle of the ventral nail fold causing the  expansion of the nail plate.   A curvilinear incision was fashioned over the DIP joint allowing exposure of  the extensor tendon.  An arthrotomy was created on the dorsal ulnar and  dorsal radial aspect with resection of the capsule between the distal  extensor tendon and the radial and ulnar collateral ligaments. A small  curette was used to remove osteophytes at the base of the distal phalanx  followed by irrigation of the joint with sterile saline utilizing an 18  gauge needle and a 20 mL syringe.  The hilar articular cartilage showed  grade 3 and grade 3 chondromalacia.   The nail fold was then repaired by mobilization of the radial and ulnar   aspects of the ventral nail fold followed by closure anatomically repairing  the ventral nail fold with interrupted sutures of 6-0 chromic.   The nail plate was thinned and replaced.  The incision over the DIP joint  was then closed with interrupted sutures of 5-0 nylon. The finger was  dressed with Xeroform, sterile gauze and Coban.   There were no apparent complications.  For postoperative care, Mr. Mattern  will return for followup in one week. We will check his culture and biopsy  results within approximately 72 hours.                                               Katy Fitch Naaman Plummer., M.D.    RVS/MEDQ  D:  09/27/2003  T:  09/27/2003  Job:  [id number]   cc:   [place]. Yetta Barre, M.D.  [address] [place]  [place] [address]  Fax: [phone]

## 2010-11-13 ENCOUNTER — Other Ambulatory Visit (INDEPENDENT_AMBULATORY_CARE_PROVIDER_SITE_OTHER): Payer: Medicare Other

## 2010-11-13 DIAGNOSIS — Z1211 Encounter for screening for malignant neoplasm of colon: Secondary | ICD-10-CM

## 2010-11-13 LAB — HEMOCCULT GUIAC POC 1CARD (OFFICE): Fecal Occult Blood, POC: NEGATIVE

## 2011-01-10 ENCOUNTER — Other Ambulatory Visit (INDEPENDENT_AMBULATORY_CARE_PROVIDER_SITE_OTHER): Payer: Medicare Other

## 2011-01-10 ENCOUNTER — Other Ambulatory Visit: Payer: Self-pay | Admitting: Internal Medicine

## 2011-01-10 DIAGNOSIS — R7309 Other abnormal glucose: Secondary | ICD-10-CM

## 2011-01-10 DIAGNOSIS — D649 Anemia, unspecified: Secondary | ICD-10-CM

## 2011-01-10 LAB — CBC WITH DIFFERENTIAL/PLATELET
Basophils Relative: 0.4 % (ref 0.0–3.0)
Eosinophils Absolute: 0.4 10*3/uL (ref 0.0–0.7)
Eosinophils Relative: 4.7 % (ref 0.0–5.0)
HCT: 37.9 % — ABNORMAL LOW (ref 39.0–52.0)
Lymphs Abs: 1.4 10*3/uL (ref 0.7–4.0)
MCHC: 34 g/dL (ref 30.0–36.0)
MCV: 97.5 fl (ref 78.0–100.0)
Monocytes Absolute: 0.8 10*3/uL (ref 0.1–1.0)
Neutrophils Relative %: 68.3 % (ref 43.0–77.0)
Platelets: 230 10*3/uL (ref 150.0–400.0)
WBC: 8.3 10*3/uL (ref 4.5–10.5)

## 2011-01-10 LAB — HEMOGLOBIN A1C: Hgb A1c MFr Bld: 6.1 % (ref 4.6–6.5)

## 2011-01-17 ENCOUNTER — Other Ambulatory Visit: Payer: Self-pay | Admitting: Internal Medicine

## 2011-01-28 ENCOUNTER — Encounter: Payer: Self-pay | Admitting: Internal Medicine

## 2011-01-28 ENCOUNTER — Ambulatory Visit (INDEPENDENT_AMBULATORY_CARE_PROVIDER_SITE_OTHER): Payer: Medicare Other | Admitting: Internal Medicine

## 2011-01-28 DIAGNOSIS — D649 Anemia, unspecified: Secondary | ICD-10-CM

## 2011-01-28 DIAGNOSIS — M25512 Pain in left shoulder: Secondary | ICD-10-CM

## 2011-01-28 DIAGNOSIS — D539 Nutritional anemia, unspecified: Secondary | ICD-10-CM

## 2011-01-28 DIAGNOSIS — K5730 Diverticulosis of large intestine without perforation or abscess without bleeding: Secondary | ICD-10-CM

## 2011-01-28 DIAGNOSIS — M25519 Pain in unspecified shoulder: Secondary | ICD-10-CM

## 2011-01-28 LAB — CBC WITH DIFFERENTIAL/PLATELET
Basophils Absolute: 0 10*3/uL (ref 0.0–0.1)
HCT: 42.4 % (ref 39.0–52.0)
Lymphs Abs: 1.5 10*3/uL (ref 0.7–4.0)
MCV: 96.8 fl (ref 78.0–100.0)
Monocytes Absolute: 0.9 10*3/uL (ref 0.1–1.0)
Neutrophils Relative %: 71.4 % (ref 43.0–77.0)
Platelets: 236 10*3/uL (ref 150.0–400.0)
RDW: 13.5 % (ref 11.5–14.6)

## 2011-01-28 LAB — FOLATE: Folate: >24.8 ng/mL (ref >5.9)

## 2011-01-28 LAB — VITAMIN B12: Vitamin B-12: 817 pg/mL (ref 211–911)

## 2011-01-28 MED ORDER — TRAMADOL HCL 50 MG PO TABS: 50.0000 mg | ORAL_TABLET | Freq: Four times a day (QID) | ORAL | Status: AC | PRN

## 2011-01-28 NOTE — Progress Notes (Signed)
  Subjective:    Patient ID: Walter Hess, male    DOB: Jun 15, 1941, 70 y.o.   MRN: [id number]  HPI  Mr. Rouch is here to assess anemia. A year ago his hematocrit was 43. In April/2012 it was 38.5. On 7/19 his hematocrit was 37.9. Remotely in March/2009 hematocrit was 39.5. There is no thrombocytopenia or abnormality of the white count differential. He previously was a blood donor but  not  for several years  He has no active constitutional symptoms. He denies epistaxis, hemoptysis, hematemesis, melena, rectal bleeding, hematuria, abnormal bruising or bleeding. His last colonoscopy was in 2005 and revealed diverticulosis. FOB studies were negative.    Review of Systems his orthopedist has been treating him since March of this year with injections and nonsteroidals. He is switched from Motrin 800 mg 3 times a day 2 Aleve 3 a day. He denies gastritis or dysphagia.     Objective:   Physical Exam  He is thin but  well nourished and healthy appearing.  He has no lymphadenopathy about the head, neck, or axilla  Cardiac rhythm is regular with no significant murmurs  He has no organomegaly or masses. He has no abdominal tenderness to palpation.  He has no jaundice, bruising or scleral icterus.        Assessment & Plan:  #1 anemia, mild. Review of systems is negative for any bleeding diathesis.  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 Rule out NSAID-induced gastritis.  Plan: B12, folate, iron studies, repeat CBC

## 2011-01-28 NOTE — Patient Instructions (Addendum)
If the anemia persists or progresses; a GI referral is appropriate to rule out NSAID-induced gastric issues.The triggers for  Gastritis   include stress; the "aspirin family" ; alcohol; peppermint; and caffeine (coffee, tea, cola, and chocolate). The aspirin family would include aspirin and the nonsteroidal agents such as ibuprofen, Aleve &  Naproxen. Tylenol would not cause reflux. If having any GI symptoms  ; food & drink should be avoided for @ least 2 hours before going to bed.  Stop the low-dose coated aspirin as well;it  would have no prophylactic benefit if you're taking nonsteroidal agents.

## 2011-02-17 ENCOUNTER — Encounter: Payer: Self-pay | Admitting: Internal Medicine

## 2011-04-03 ENCOUNTER — Ambulatory Visit (INDEPENDENT_AMBULATORY_CARE_PROVIDER_SITE_OTHER): Payer: Medicare Other

## 2011-04-03 DIAGNOSIS — Z23 Encounter for immunization: Secondary | ICD-10-CM

## 2011-08-07 DIAGNOSIS — L821 Other seborrheic keratosis: Secondary | ICD-10-CM | POA: Diagnosis not present

## 2011-08-07 DIAGNOSIS — L408 Other psoriasis: Secondary | ICD-10-CM | POA: Diagnosis not present

## 2011-08-07 DIAGNOSIS — L570 Actinic keratosis: Secondary | ICD-10-CM | POA: Diagnosis not present

## 2011-11-04 ENCOUNTER — Encounter: Payer: Medicare Other | Admitting: Internal Medicine

## 2011-11-27 DIAGNOSIS — N400 Enlarged prostate without lower urinary tract symptoms: Secondary | ICD-10-CM | POA: Diagnosis not present

## 2011-11-27 DIAGNOSIS — R972 Elevated prostate specific antigen [PSA]: Secondary | ICD-10-CM | POA: Diagnosis not present

## 2011-12-27 ENCOUNTER — Encounter: Payer: Medicare Other | Admitting: Internal Medicine

## 2012-01-13 ENCOUNTER — Ambulatory Visit (INDEPENDENT_AMBULATORY_CARE_PROVIDER_SITE_OTHER): Payer: Medicare Other | Admitting: Internal Medicine

## 2012-01-13 ENCOUNTER — Encounter: Payer: Self-pay | Admitting: Internal Medicine

## 2012-01-13 VITALS — BP 148/80 | HR 75 | Temp 98.2°F | Resp 12 | Ht 70.08 in | Wt 170.2 lb

## 2012-01-13 DIAGNOSIS — R972 Elevated prostate specific antigen [PSA]: Secondary | ICD-10-CM | POA: Diagnosis not present

## 2012-01-13 DIAGNOSIS — K5730 Diverticulosis of large intestine without perforation or abscess without bleeding: Secondary | ICD-10-CM | POA: Diagnosis not present

## 2012-01-13 DIAGNOSIS — D649 Anemia, unspecified: Secondary | ICD-10-CM

## 2012-01-13 DIAGNOSIS — R9431 Abnormal electrocardiogram [ECG] [EKG]: Secondary | ICD-10-CM | POA: Diagnosis not present

## 2012-01-13 DIAGNOSIS — E782 Mixed hyperlipidemia: Secondary | ICD-10-CM | POA: Diagnosis not present

## 2012-01-13 DIAGNOSIS — Z0000 Encounter for general adult medical examination without abnormal findings: Secondary | ICD-10-CM

## 2012-01-13 MED ORDER — PRAVASTATIN SODIUM 40 MG PO TABS: ORAL_TABLET | ORAL | Status: DC

## 2012-01-13 NOTE — Progress Notes (Signed)
Subjective:    Patient ID: Walter Hess, male    DOB: 1941-05-14, 71 y.o.   MRN: [id number]  HPI Medicare Wellness Visit:  The following psychosocial & medical history were reviewed as required by Medicare.   Social history: caffeine: 1 cup/day , alcohol: occasional beer ,  tobacco use : quit 1960  & exercise : see below.   Home & personal  safety / fall risk: no significant issues, activities of daily living: no limitations , seatbelt use : yes , and smoke alarm employment : yes .  Power of Attorney/Living Will status : needs to be signed  Vision ( as recorded per Nurse) & Hearing  evaluation :  Ophth 9/12, seen annually.No hearing evaluation Orientation :oriented X 3 , memory & recall :good,  math testing: good,and mood & affect : normal . Depression / anxiety: denied Travel history : [place] 1957 , immunization status :up to date , transfusion history:  no, and preventive health surveillance ( colonoscopies, BMD , etc as per protocol/ [facility]): colonoscopy 2015, Dental care:  Seen every 6 mos . Chart reviewed &  Updated. Active issues reviewed & addressed.       Review of Systems He exercises on a treadmill for 25 minutes daily at a speed of 4 mph. He also does calisthenics and weights. He specifically denies chest pain, palpitations, dyspnea, or claudication. EKG has demonstrated minor nonspecific changes in the T of aVL.     Objective:   Physical Exam Gen.: Healthy and well-nourished in appearance. Alert, appropriate and cooperative throughout exam. Head: Normocephalic without obvious abnormalities  Eyes: No corneal or conjunctival inflammation noted. Extraocular motion intact. Vision grossly normal with lenses. Ears: External  ear exam reveals no significant lesions or deformities. Canals clear .TMs normal. Hearing is grossly decreased on L. Nose: External nasal exam reveals no deformity or inflammation. Nasal mucosa are pink and moist. No lesions or exudates noted. Septum deviated to L   Mouth: Oral mucosa and oropharynx reveal no lesions or exudates. Teeth in good repair. Neck: No deformities, masses, or tenderness noted. Range of motion &  Thyroid normal Lungs: Normal respiratory effort; chest expands symmetrically. Lungs are clear to auscultation without rales, wheezes, or increased work of breathing. Heart: Normal rate and rhythm. Normal S1 and S2. No gallop, click, or rub. S4 w/o murmur. Abdomen: Bowel sounds normal; abdomen soft and nontender. No masses, organomegaly or hernias noted. Genitalia: Dr Logan Bores                                                                              Musculoskeletal/extremities: No deformity or scoliosis noted of  the thoracic or lumbar spine. No clubbing, cyanosis, edema, or deformity noted. Range of motion  normal .Tone & strength  normal.Joints normal. Nail health  good. Minor Dupuytren's contractures, right palm > L Vascular: Carotid, radial artery, dorsalis pedis and  posterior tibial pulses are full and equal. No bruits present. Neurologic: Alert and oriented x3. Deep tendon reflexes symmetrical and normal.         Skin: Intact without suspicious lesions or rashes. Lymph: No cervical, axillary lymphadenopathy present. Psych: Mood and affect are normal. Normally interactive  Assessment & Plan:  #1 Medicare Wellness Exam; criteria met ; data entered #2 Problem List reviewed ; Assessment/ Recommendations made Plan: see Orders

## 2012-01-13 NOTE — Patient Instructions (Addendum)
Preventive Health Care: Exercise at least 30-45 minutes a day,  3-4 days a week.  Eat a low-fat diet with lots of fruits and vegetables, up to 7-9 servings per day.  Consume less than 40 grams of sugar per day from foods & drinks with High Fructose Corn Sugar as #2,3 or # 4 on label. Health Care Power of Attorney & Living Will. Complete if not in place ; these place you in charge of your health care decisions. Take the EKG to any emergency room or preop visits. There are nonspecific T changes in leads 1 & aVL; as long as you are asymptomatic & there is no new change, these are not clinically significant Please  schedule fasting Labs : BMET,Lipids, hepatic panel, CBC & dif, TSH. PLEASE BRING THESE INSTRUCTIONS TO FOLLOW UP  LAB APPOINTMENT.This will guarantee correct labs are drawn, eliminating need for repeat blood sampling ( needle sticks ! ). Diagnoses /Codes: 272.4,285.9,995.20 Please try to go on My Chart within the next 24 hours to allow me to release the results directly to you.

## 2012-01-14 ENCOUNTER — Other Ambulatory Visit (INDEPENDENT_AMBULATORY_CARE_PROVIDER_SITE_OTHER): Payer: Medicare Other

## 2012-01-14 DIAGNOSIS — T887XXA Unspecified adverse effect of drug or medicament, initial encounter: Secondary | ICD-10-CM | POA: Diagnosis not present

## 2012-01-14 DIAGNOSIS — D649 Anemia, unspecified: Secondary | ICD-10-CM

## 2012-01-14 DIAGNOSIS — E785 Hyperlipidemia, unspecified: Secondary | ICD-10-CM | POA: Diagnosis not present

## 2012-01-14 LAB — LIPID PANEL
Cholesterol: 113 mg/dL (ref 0–200)
HDL: 46.7 mg/dL (ref >39.00)

## 2012-01-14 LAB — TSH: TSH: 3.68 u[IU]/mL (ref 0.35–5.50)

## 2012-01-14 LAB — CBC WITH DIFFERENTIAL/PLATELET
Basophils Absolute: 0.1 10*3/uL (ref 0.0–0.1)
Eosinophils Relative: 5.9 % — ABNORMAL HIGH (ref 0.0–5.0)
HCT: 43.4 % (ref 39.0–52.0)
Hemoglobin: 14.7 g/dL (ref 13.0–17.0)
Lymphocytes Relative: 21.6 % (ref 12.0–46.0)
Lymphs Abs: 1.5 10*3/uL (ref 0.7–4.0)
Monocytes Relative: 8.7 % (ref 3.0–12.0)
Platelets: 205 10*3/uL (ref 150.0–400.0)
RDW: 13.1 % (ref 11.5–14.6)
WBC: 7.1 10*3/uL (ref 4.5–10.5)

## 2012-01-14 LAB — BASIC METABOLIC PANEL
Calcium: 9.2 mg/dL (ref 8.4–10.5)
GFR: 66.82 mL/min (ref >60.00)
Glucose, Bld: 118 mg/dL — ABNORMAL HIGH (ref 70–99)
Sodium: 141 mEq/L (ref 135–145)

## 2012-01-14 LAB — HEPATIC FUNCTION PANEL
AST: 25 U/L (ref 0–37)
Albumin: 4.1 g/dL (ref 3.5–5.2)
Total Protein: 7.2 g/dL (ref 6.0–8.3)

## 2012-01-14 NOTE — Progress Notes (Signed)
Labs only

## 2012-02-12 DIAGNOSIS — L821 Other seborrheic keratosis: Secondary | ICD-10-CM | POA: Diagnosis not present

## 2012-02-12 DIAGNOSIS — L408 Other psoriasis: Secondary | ICD-10-CM | POA: Diagnosis not present

## 2012-04-08 ENCOUNTER — Ambulatory Visit (INDEPENDENT_AMBULATORY_CARE_PROVIDER_SITE_OTHER): Payer: Medicare Other | Admitting: *Deleted

## 2012-04-08 DIAGNOSIS — Z23 Encounter for immunization: Secondary | ICD-10-CM | POA: Diagnosis not present

## 2012-04-29 DIAGNOSIS — H47239 Glaucomatous optic atrophy, unspecified eye: Secondary | ICD-10-CM | POA: Diagnosis not present

## 2012-04-29 DIAGNOSIS — H5210 Myopia, unspecified eye: Secondary | ICD-10-CM | POA: Diagnosis not present

## 2012-04-29 DIAGNOSIS — H2589 Other age-related cataract: Secondary | ICD-10-CM | POA: Diagnosis not present

## 2012-04-29 DIAGNOSIS — H52229 Regular astigmatism, unspecified eye: Secondary | ICD-10-CM | POA: Diagnosis not present

## 2012-08-12 DIAGNOSIS — L570 Actinic keratosis: Secondary | ICD-10-CM | POA: Diagnosis not present

## 2012-10-21 ENCOUNTER — Other Ambulatory Visit: Payer: Self-pay | Admitting: General Practice

## 2012-10-21 DIAGNOSIS — E782 Mixed hyperlipidemia: Secondary | ICD-10-CM

## 2012-10-21 MED ORDER — PRAVASTATIN SODIUM 40 MG PO TABS: ORAL_TABLET | ORAL | Status: DC

## 2012-12-27 DIAGNOSIS — R972 Elevated prostate specific antigen [PSA]: Secondary | ICD-10-CM | POA: Diagnosis not present

## 2013-01-06 DIAGNOSIS — N400 Enlarged prostate without lower urinary tract symptoms: Secondary | ICD-10-CM | POA: Diagnosis not present

## 2013-01-06 DIAGNOSIS — R972 Elevated prostate specific antigen [PSA]: Secondary | ICD-10-CM | POA: Diagnosis not present

## 2013-01-13 ENCOUNTER — Encounter: Payer: Self-pay | Admitting: Internal Medicine

## 2013-01-13 ENCOUNTER — Ambulatory Visit (INDEPENDENT_AMBULATORY_CARE_PROVIDER_SITE_OTHER): Payer: Medicare Other | Admitting: Internal Medicine

## 2013-01-13 VITALS — BP 144/82 | HR 56 | Temp 98.0°F | Resp 12 | Ht 70.5 in | Wt 173.0 lb

## 2013-01-13 DIAGNOSIS — D649 Anemia, unspecified: Secondary | ICD-10-CM | POA: Diagnosis not present

## 2013-01-13 DIAGNOSIS — E785 Hyperlipidemia, unspecified: Secondary | ICD-10-CM

## 2013-01-13 DIAGNOSIS — Z0000 Encounter for general adult medical examination without abnormal findings: Secondary | ICD-10-CM

## 2013-01-13 DIAGNOSIS — E782 Mixed hyperlipidemia: Secondary | ICD-10-CM | POA: Diagnosis not present

## 2013-01-13 DIAGNOSIS — J310 Chronic rhinitis: Secondary | ICD-10-CM | POA: Diagnosis not present

## 2013-01-13 LAB — HEPATIC FUNCTION PANEL
ALT: 19 U/L (ref 0–53)
AST: 28 U/L (ref 0–37)
Albumin: 4 g/dL (ref 3.5–5.2)
Total Bilirubin: 1.2 mg/dL (ref 0.3–1.2)
Total Protein: 7.1 g/dL (ref 6.0–8.3)

## 2013-01-13 LAB — CBC WITH DIFFERENTIAL/PLATELET
Basophils Relative: 0.4 % (ref 0.0–3.0)
Eosinophils Relative: 4.1 % (ref 0.0–5.0)
HCT: 42.3 % (ref 39.0–52.0)
Hemoglobin: 14.6 g/dL (ref 13.0–17.0)
Lymphs Abs: 1.3 10*3/uL (ref 0.7–4.0)
MCV: 98.8 fl (ref 78.0–100.0)
Monocytes Absolute: 0.7 10*3/uL (ref 0.1–1.0)
Monocytes Relative: 9.5 % (ref 3.0–12.0)
Neutro Abs: 5 10*3/uL (ref 1.4–7.7)
RBC: 4.28 Mil/uL (ref 4.22–5.81)
WBC: 7.3 10*3/uL (ref 4.5–10.5)

## 2013-01-13 LAB — LIPID PANEL
Cholesterol: 137 mg/dL (ref 0–200)
LDL Cholesterol: 78 mg/dL (ref 0–99)
Triglycerides: 84 mg/dL (ref 0.0–149.0)

## 2013-01-13 LAB — BASIC METABOLIC PANEL
BUN: 17 mg/dL (ref 6–23)
Chloride: 105 mEq/L (ref 96–112)
GFR: 71.7 mL/min (ref >60.00)
Potassium: 4 mEq/L (ref 3.5–5.1)
Sodium: 139 mEq/L (ref 135–145)

## 2013-01-13 MED ORDER — PRAVASTATIN SODIUM 40 MG PO TABS: ORAL_TABLET | ORAL | Status: DC

## 2013-01-13 NOTE — Patient Instructions (Addendum)
Take the EKG to any emergency room or preop visits. There are nonspecific changes; as long as there is no new change these are not clinically significant . If the old EKG is not available for comparison; it may result in unnecessary hospitalization for observation with significant unnecessary expense. To prevent  premature beats, avoid stimulants such as decongestants, diet pills, nicotine, or caffeine (coffee, tea, cola, or chocolate) to excess. Plain Mucinex (NOT D) for thick secretions ;force NON dairy fluids .   Nasal cleansing in the shower as discussed with lather of mild shampoo.After 10 seconds wash off lather while  exhaling through nostrils. Make sure that all residual soap is removed to prevent irritation.  Nasonex 1 spray in each nostril twice a day as needed. Use the "crossover" technique into opposite nostril spraying toward opposite ear @ 45 degree angle, not straight up into nostril.  Use a Neti pot daily only  as needed for significant sinus congestion; going from open side to congested side . Plain Allegra (NOT D )  160 daily , Loratidine 10 mg , OR Zyrtec 10 mg @ bedtime  as needed for itchy eyes & sneezing.

## 2013-01-13 NOTE — Progress Notes (Signed)
Subjective:    Patient ID: Walter Hess, male    DOB: [birth year], 72 y.o.   MRN: [id number]  HPI Medicare Wellness Visit:  Psychosocial & medical history were reviewed as required by Medicare (abuse,antisocial behavioral risks,firearm risk).  Social history: caffeine:  1 cup /day , alcohol: occasional beer  ,  tobacco use:   Quit 1960 Exercise :  See below Home & personal  safety / fall risk: no Limitations of activities of daily living:no Seatbelt  and smoke alarm use:yes Power of Attorney/Living Will status : in place Ophthalmology exam status :current Hearing evaluation status:not current Orientation :oriented X 3 Memory & recall :good Spelling or math testing:good Active depression / anxiety:denied Foreign travel history : 20 [place] Immunization status for Shingles /Flu/ PNA/ tetanus : current Transfusion history:  no Preventive health surveillance status of colonoscopy as per protocol/ SOC: current Dental care:  Every 6 mos Chart reviewed &  Updated. Active issues reviewed & addressed.      Review of Systems He is on a heart healthy diet; he exercises 25 minutes on treadmill @ 5% & 7 times per week without symptoms. Specifically he denies chest pain, palpitations, dyspnea, or claudication. Family history is ? positive for premature coronary disease in his sister. Advanced cholesterol testing reveals his LDL goal is less than 100.  He has had some chronic nasal congestion w/o fever or purulence.     Objective:   Physical Exam Gen.: Thin but healthy and well-nourished in appearance. Alert, appropriate and cooperative throughout exam.Appears younger than stated age  Head: Normocephalic without obvious abnormalities;  no alopecia  Eyes: No corneal or conjunctival inflammation noted. Pupils equal round reactive to light and accommodation. Fundal exam is benign without hemorrhages, exudate, papilledema. Extraocular motion intact. Vision grossly normal with lenses Ears:  External  ear exam reveals no significant lesions or deformities. Canals clear .TMs normal. Hearing is grossly decreased on L. Nose: External nasal exam reveals no deformity or inflammation. Nasal mucosa are pink and moist. No lesions or exudates noted.   Mouth: Oral mucosa and oropharynx reveal no lesions or exudates. Teeth in good repair. Neck: No deformities, masses, or tenderness noted. Range of motion &Thyroid normal. Lungs: Normal respiratory effort; chest expands symmetrically. Lungs are clear to auscultation without rales, wheezes, or increased work of breathing. Heart: Normal rate and rhythm. Normal S1 and S2. No gallop, click, or rub. S4 w/o murmur. Abdomen: Bowel sounds normal; abdomen soft and nontender. No masses, organomegaly or hernias noted. Genitalia: As per Dr Logan Bores                                Musculoskeletal/extremities: No deformity or scoliosis noted of  the thoracic or lumbar spine.   No clubbing, cyanosis, edema, or significant extremity  deformity noted. Range of motion normal .Tone & strength  Normal. Joints  reveal minor isolated  DJD DIP changes. Minor knee crepitus.Nail health good. Able to lie down & sit up w/o help. Negative SLR bilaterally Vascular: Carotid, radial artery, dorsalis pedis and  posterior tibial pulses are full and equal. No bruits present. Neurologic: Alert and oriented x3. Deep tendon reflexes symmetrical and normal.       Skin: Intact without suspicious lesions or rashes. Lymph: No cervical, axillary  lymphadenopathy present. Psych: Mood and affect are normal. Normally interactive  Assessment & Plan:  #1 Medicare Wellness Exam; criteria met ; data entered #2 Problem List/Diagnoses reviewed #3 PACs , asymptomatic Plan:  Assessments made/ Orders entered

## 2013-01-15 ENCOUNTER — Ambulatory Visit: Payer: Medicare Other

## 2013-01-15 DIAGNOSIS — R7309 Other abnormal glucose: Secondary | ICD-10-CM

## 2013-01-15 LAB — HEMOGLOBIN A1C: Hgb A1c MFr Bld: 5.9 % (ref 4.6–6.5)

## 2013-01-18 ENCOUNTER — Telehealth: Payer: Self-pay | Admitting: Internal Medicine

## 2013-01-18 NOTE — Telephone Encounter (Signed)
A1c was added to blood already drawn, report was sent to patient via mychart. Spoke with patient, patient verbalized understanding of this process

## 2013-01-18 NOTE — Telephone Encounter (Signed)
Patient states that because of the results of his lab work he needs to come back in for an A1c. Did not see any notes or future labs pertaining to this request. Please advise.

## 2013-02-10 DIAGNOSIS — L821 Other seborrheic keratosis: Secondary | ICD-10-CM | POA: Diagnosis not present

## 2013-02-10 DIAGNOSIS — L408 Other psoriasis: Secondary | ICD-10-CM | POA: Diagnosis not present

## 2013-02-10 DIAGNOSIS — L820 Inflamed seborrheic keratosis: Secondary | ICD-10-CM | POA: Diagnosis not present

## 2013-02-10 DIAGNOSIS — L570 Actinic keratosis: Secondary | ICD-10-CM | POA: Diagnosis not present

## 2013-02-10 DIAGNOSIS — D485 Neoplasm of uncertain behavior of skin: Secondary | ICD-10-CM | POA: Diagnosis not present

## 2013-03-23 DIAGNOSIS — Z23 Encounter for immunization: Secondary | ICD-10-CM | POA: Diagnosis not present

## 2013-04-29 ENCOUNTER — Other Ambulatory Visit: Payer: Self-pay

## 2013-05-05 DIAGNOSIS — H43399 Other vitreous opacities, unspecified eye: Secondary | ICD-10-CM | POA: Diagnosis not present

## 2013-05-05 DIAGNOSIS — H2510 Age-related nuclear cataract, unspecified eye: Secondary | ICD-10-CM | POA: Diagnosis not present

## 2013-07-11 ENCOUNTER — Encounter: Payer: Self-pay | Admitting: Internal Medicine

## 2013-07-15 DIAGNOSIS — M2749 Other cysts of jaw: Secondary | ICD-10-CM | POA: Diagnosis not present

## 2013-08-16 DIAGNOSIS — L570 Actinic keratosis: Secondary | ICD-10-CM | POA: Diagnosis not present

## 2013-08-16 DIAGNOSIS — L219 Seborrheic dermatitis, unspecified: Secondary | ICD-10-CM | POA: Diagnosis not present

## 2013-08-16 DIAGNOSIS — D1801 Hemangioma of skin and subcutaneous tissue: Secondary | ICD-10-CM | POA: Diagnosis not present

## 2013-08-16 DIAGNOSIS — L821 Other seborrheic keratosis: Secondary | ICD-10-CM | POA: Diagnosis not present

## 2013-08-16 DIAGNOSIS — L408 Other psoriasis: Secondary | ICD-10-CM | POA: Diagnosis not present

## 2014-01-12 DIAGNOSIS — R972 Elevated prostate specific antigen [PSA]: Secondary | ICD-10-CM | POA: Diagnosis not present

## 2014-01-12 DIAGNOSIS — N400 Enlarged prostate without lower urinary tract symptoms: Secondary | ICD-10-CM | POA: Diagnosis not present

## 2014-01-19 ENCOUNTER — Encounter: Payer: Self-pay | Admitting: Internal Medicine

## 2014-01-19 ENCOUNTER — Ambulatory Visit (INDEPENDENT_AMBULATORY_CARE_PROVIDER_SITE_OTHER): Payer: Medicare Other | Admitting: Internal Medicine

## 2014-01-19 ENCOUNTER — Other Ambulatory Visit (INDEPENDENT_AMBULATORY_CARE_PROVIDER_SITE_OTHER): Payer: Medicare Other

## 2014-01-19 VITALS — BP 130/86 | HR 61 | Temp 98.0°F | Ht 71.0 in | Wt 171.2 lb

## 2014-01-19 DIAGNOSIS — K5730 Diverticulosis of large intestine without perforation or abscess without bleeding: Secondary | ICD-10-CM

## 2014-01-19 DIAGNOSIS — E782 Mixed hyperlipidemia: Secondary | ICD-10-CM

## 2014-01-19 DIAGNOSIS — D649 Anemia, unspecified: Secondary | ICD-10-CM

## 2014-01-19 LAB — HEPATIC FUNCTION PANEL
ALBUMIN: 4.1 g/dL (ref 3.5–5.2)
ALK PHOS: 35 U/L — AB (ref 39–117)
ALT: 22 U/L (ref 0–53)
AST: 27 U/L (ref 0–37)
Bilirubin, Direct: 0.2 mg/dL (ref 0.0–0.3)
Total Bilirubin: 1.5 mg/dL — ABNORMAL HIGH (ref 0.2–1.2)
Total Protein: 7 g/dL (ref 6.0–8.3)

## 2014-01-19 LAB — CBC WITH DIFFERENTIAL/PLATELET
BASOS PCT: 0.4 % (ref 0.0–3.0)
Basophils Absolute: 0 10*3/uL (ref 0.0–0.1)
Eosinophils Absolute: 0.3 10*3/uL (ref 0.0–0.7)
Eosinophils Relative: 4.2 % (ref 0.0–5.0)
HEMATOCRIT: 43.5 % (ref 39.0–52.0)
HEMOGLOBIN: 14.9 g/dL (ref 13.0–17.0)
LYMPHS PCT: 19.1 % (ref 12.0–46.0)
Lymphs Abs: 1.5 10*3/uL (ref 0.7–4.0)
MCHC: 34.1 g/dL (ref 30.0–36.0)
MCV: 99.7 fl (ref 78.0–100.0)
MONOS PCT: 9 % (ref 3.0–12.0)
Monocytes Absolute: 0.7 10*3/uL (ref 0.1–1.0)
NEUTROS ABS: 5.2 10*3/uL (ref 1.4–7.7)
Neutrophils Relative %: 67.3 % (ref 43.0–77.0)
Platelets: 209 10*3/uL (ref 150.0–400.0)
RBC: 4.37 Mil/uL (ref 4.22–5.81)
RDW: 13.1 % (ref 11.5–15.5)
WBC: 7.8 10*3/uL (ref 4.0–10.5)

## 2014-01-19 LAB — LIPID PANEL
CHOLESTEROL: 127 mg/dL (ref 0–200)
HDL: 45.1 mg/dL (ref >39.00)
LDL Cholesterol: 68 mg/dL (ref 0–99)
NonHDL: 81.9
TRIGLYCERIDES: 70 mg/dL (ref 0.0–149.0)
Total CHOL/HDL Ratio: 3
VLDL: 14 mg/dL (ref 0.0–40.0)

## 2014-01-19 LAB — BASIC METABOLIC PANEL
BUN: 18 mg/dL (ref 6–23)
CHLORIDE: 106 meq/L (ref 96–112)
CO2: 26 meq/L (ref 19–32)
CREATININE: 1.1 mg/dL (ref 0.4–1.5)
Calcium: 9.2 mg/dL (ref 8.4–10.5)
GFR: 66.46 mL/min (ref >60.00)
Glucose, Bld: 112 mg/dL — ABNORMAL HIGH (ref 70–99)
Potassium: 4.1 mEq/L (ref 3.5–5.1)
Sodium: 139 mEq/L (ref 135–145)

## 2014-01-19 LAB — TSH: TSH: 3.24 u[IU]/mL (ref 0.35–4.50)

## 2014-01-19 LAB — CK: CK TOTAL: 172 U/L (ref 7–232)

## 2014-01-19 NOTE — Progress Notes (Signed)
Pre visit review using our clinic review tool, if applicable. No additional management support is needed unless otherwise documented below in the visit note. 

## 2014-01-19 NOTE — Assessment & Plan Note (Signed)
CBC & dif 

## 2014-01-19 NOTE — Patient Instructions (Signed)
Your next office appointment will be determined based upon review of your pending labs . Those instructions will be transmitted to you through My Chart  OR  by mail;whichever process is your choice to receive results & recommendations . 

## 2014-01-19 NOTE — Progress Notes (Signed)
   Subjective:    Patient ID: Walter Hess, male    DOB: 01-05-1941, 73 y.o.   MRN: [id number]  HPI He is here to assess active health issues & conditions. PMH, FH, & Social history verified & updated    A heart healthy diet is followed; exercise encompasses calisthentics , weights & treadmill;  40-45 minutes every day. He has no symptoms with this high level of exercise  His father had myocardial infarction @59  as did the paternal uncle. There is no family history of stroke.  Advanced cholesterol testing reveals his LDL goal is less than 100, ideally less than 70. There is medication compliance with the statin.  Low dose ASA taken  Review of Systems  Specifically denied are  chest pain, palpitations, dyspnea, or claudication.  Significant abdominal symptoms, memory deficit, or myalgias not present.      Objective:   Physical Exam  Significant or distinguishing  findings on physical exam include:  He is thin but appears healthy and well-nourished. He has decreased hearing bilaterally Heart rate is slow but regular with no murmurs or gallops. Dupuytren's contractures present in the right palm.   Alert, appropriate and cooperative throughout exam. Appears younger than stated age  Head: Normocephalic without obvious abnormalities;  some crown alopecia  Eyes: No corneal or conjunctival inflammation noted. Pupils equal round reactive to light and accommodation. Extraocular motion intact.  Ears: External  ear exam reveals no significant lesions or deformities. Canals clear .TMs normal.  Nose: External nasal exam reveals no deformity or inflammation. Nasal mucosa are pink and moist. No lesions or exudates noted.   Mouth: Oral mucosa and oropharynx reveal no lesions or exudates. Teeth in good repair. Neck: No deformities, masses, or tenderness noted. Range of motion & Thyroid normal Lungs: Normal respiratory effort; chest expands symmetrically. Lungs are clear to auscultation  without rales, wheezes, or increased work of breathing. Abdomen: Bowel sounds normal; abdomen soft and nontender. No masses, organomegaly or hernias noted. Genitalia: as per Urology                               Musculoskeletal/extremities: No deformity or scoliosis noted of  the thoracic or lumbar spine.  No clubbing, cyanosis, edema, or significant extremity  deformity noted. Range of motion normal .Tone & strength normal. Hand joints normal Fingernail  health good. Able to lie down & sit up w/o help. Negative SLR bilaterally Vascular: Carotid, radial artery, dorsalis pedis and  posterior tibial pulses are full and equal. No bruits present. Neurologic: Alert and oriented x3. Deep tendon reflexes symmetrical and normal.  Gait normal.       Skin: Intact without suspicious lesions or rashes.[place] over drunk. Lymph: No cervical, axillary lymphadenopathy present. Psych: Mood and affect are normal. Normally interactive                                                                                        Assessment & Plan:  See Current Assessment & Plan in Problem List under specific Diagnosis

## 2014-01-19 NOTE — Assessment & Plan Note (Signed)
BMET,Lipids, LFTs, TSH ,CK

## 2014-01-24 ENCOUNTER — Ambulatory Visit: Payer: Medicare Other

## 2014-01-24 ENCOUNTER — Telehealth: Payer: Self-pay

## 2014-01-24 DIAGNOSIS — R7309 Other abnormal glucose: Secondary | ICD-10-CM

## 2014-01-24 LAB — HEMOGLOBIN A1C: Hgb A1c MFr Bld: 5.9 % (ref 4.6–6.5)

## 2014-01-24 NOTE — Telephone Encounter (Signed)
Message copied by Shelly Coss on Mon Jan 24, 2014  9:47 AM ------      Message from: Hendricks Limes      Created: Sun Jan 23, 2014  8:23 AM       Please add A1c (790.29)       ------

## 2014-01-24 NOTE — Telephone Encounter (Signed)
Add on request has been faxed to lab 

## 2014-02-04 ENCOUNTER — Encounter: Payer: Self-pay | Admitting: *Deleted

## 2014-02-15 ENCOUNTER — Encounter: Payer: Self-pay | Admitting: Internal Medicine

## 2014-02-15 DIAGNOSIS — L570 Actinic keratosis: Secondary | ICD-10-CM | POA: Diagnosis not present

## 2014-02-15 DIAGNOSIS — D1801 Hemangioma of skin and subcutaneous tissue: Secondary | ICD-10-CM | POA: Diagnosis not present

## 2014-02-15 DIAGNOSIS — L408 Other psoriasis: Secondary | ICD-10-CM | POA: Diagnosis not present

## 2014-02-15 DIAGNOSIS — L821 Other seborrheic keratosis: Secondary | ICD-10-CM | POA: Diagnosis not present

## 2014-02-16 ENCOUNTER — Encounter: Payer: Self-pay | Admitting: Internal Medicine

## 2014-03-23 DIAGNOSIS — Z23 Encounter for immunization: Secondary | ICD-10-CM | POA: Diagnosis not present

## 2014-03-29 ENCOUNTER — Other Ambulatory Visit: Payer: Self-pay

## 2014-03-29 DIAGNOSIS — E782 Mixed hyperlipidemia: Secondary | ICD-10-CM

## 2014-03-29 MED ORDER — PRAVASTATIN SODIUM 40 MG PO TABS: ORAL_TABLET | ORAL | Status: DC

## 2014-04-14 ENCOUNTER — Ambulatory Visit (AMBULATORY_SURGERY_CENTER): Payer: Self-pay | Admitting: *Deleted

## 2014-04-14 VITALS — Ht 71.0 in | Wt 174.2 lb

## 2014-04-14 DIAGNOSIS — Z1211 Encounter for screening for malignant neoplasm of colon: Secondary | ICD-10-CM

## 2014-04-14 MED ORDER — MOVIPREP 100 G PO SOLR: ORAL | Status: DC

## 2014-04-14 NOTE — Progress Notes (Signed)
No allergies to eggs or soy. No problems with anesthesia.  Pt given Emmi instructions for colonoscopy  No oxygen use  No diet drug use  

## 2014-04-26 ENCOUNTER — Encounter: Payer: Self-pay | Admitting: Internal Medicine

## 2014-04-26 ENCOUNTER — Ambulatory Visit (AMBULATORY_SURGERY_CENTER): Payer: Medicare Other | Admitting: Internal Medicine

## 2014-04-26 VITALS — BP 126/58 | HR 53 | Temp 98.4°F | Resp 52 | Ht 71.0 in | Wt 174.0 lb

## 2014-04-26 DIAGNOSIS — Z1211 Encounter for screening for malignant neoplasm of colon: Secondary | ICD-10-CM

## 2014-04-26 DIAGNOSIS — D649 Anemia, unspecified: Secondary | ICD-10-CM | POA: Diagnosis not present

## 2014-04-26 DIAGNOSIS — R9431 Abnormal electrocardiogram [ECG] [EKG]: Secondary | ICD-10-CM | POA: Diagnosis not present

## 2014-04-26 DIAGNOSIS — F959 Tic disorder, unspecified: Secondary | ICD-10-CM | POA: Diagnosis not present

## 2014-04-26 DIAGNOSIS — K5730 Diverticulosis of large intestine without perforation or abscess without bleeding: Secondary | ICD-10-CM | POA: Diagnosis not present

## 2014-04-26 MED ORDER — SODIUM CHLORIDE 0.9 % IV SOLN: 500.0000 mL | INTRAVENOUS | Status: DC

## 2014-04-26 NOTE — Op Note (Signed)
[facility]  [address]. [place], [id number]   COLONOSCOPY PROCEDURE REPORT  PATIENT: Bart, Walter Hess  MR#: [id number] BIRTHDATE: Apr 27, 1941 , 81  yrs. old GENDER: male ENDOSCOPIST: Lafayette Dragon, MD REFERRED KG:URKYHCW Linna Darner, M.D. PROCEDURE DATE:  04/26/2014 PROCEDURE:   Colonoscopy, screening First Screening Colonoscopy - Avg.  risk and is 50 yrs.  old or older - No.  Prior Negative Screening - Now for repeat screening. 10 or more years since last screening  History of Adenoma - Now for follow-up colonoscopy & has been > or = to 3 yrs.  N/A  Polyps Removed Today? No. ASA CLASS:   Class II INDICATIONS:average risk for colon cancer and last colonoscopy in October 2005 was normal. MEDICATIONS: Monitored anesthesia care and Propofol 150 mg IV  DESCRIPTION OF PROCEDURE:   After the risks benefits and alternatives of the procedure were thoroughly explained, informed consent was obtained.  The digital rectal exam revealed no abnormalities of the rectum.   The LB [id number]  endoscope was introduced through the anus and advanced to the cecum, which was identified by both the appendix and ileocecal valve. No adverse events experienced.   The quality of the prep was good, using MoviPrep  The instrument was then slowly withdrawn as the colon was fully examined.      COLON FINDINGS: There was mild diverticulosis noted in the sigmoid colon.  Retroflexed views revealed no abnormalities. The time to cecum=4 minutes 41 seconds.  Withdrawal time=6 minutes 50 seconds. The scope was withdrawn and the procedure completed. COMPLICATIONS: There were no immediate complications.  ENDOSCOPIC IMPRESSION: Mild diverticulosis was noted in the sigmoid colon  RECOMMENDATIONS: High fiber diet no recall due to age  eSigned:  Lafayette Dragon, MD 04/26/2014 10:08 AM   cc:

## 2014-04-26 NOTE — Progress Notes (Signed)
Patient is aware of his heart rate irregularity.  Dr. Olevia Perches is aware as well.  Patient denies any shortness of breath, chest pain or headache.  Reviewed symptoms of a heart attack with patient.  He does have EKG strips to bring to his Primary Doc and Cardiology.

## 2014-04-26 NOTE — Patient Instructions (Signed)
YOU HAD AN ENDOSCOPIC PROCEDURE TODAY AT [facility]: Refer to the procedure report that was given to you for any specific questions about what was found during the examination.  If the procedure report does not answer your questions, please call your gastroenterologist to clarify.  If you requested that your care partner not be given the details of your procedure findings, then the procedure report has been included in a sealed envelope for you to review at your convenience later.  YOU SHOULD EXPECT: Some feelings of bloating in the abdomen. Passage of more gas than usual.  Walking can help get rid of the air that was put into your GI tract during the procedure and reduce the bloating. If you had a lower endoscopy (such as a colonoscopy or flexible sigmoidoscopy) you may notice spotting of blood in your stool or on the toilet paper. If you underwent a bowel prep for your procedure, then you may not have a normal bowel movement for a few days.  DIET: Your first meal following the procedure should be a light meal and then it is ok to progress to your normal diet.  A half-sandwich or bowl of soup is an example of a good first meal.  Heavy or fried foods are harder to digest and may make you feel nauseous or bloated.  Likewise meals heavy in dairy and vegetables can cause extra gas to form and this can also increase the bloating.  Drink plenty of fluids but you should avoid alcoholic beverages for 24 hours.Try to eat a high fiber diet.  ACTIVITY: Your care partner should take you home directly after the procedure.  You should plan to take it easy, moving slowly for the rest of the day.  You can resume normal activity the day after the procedure however you should NOT DRIVE or use heavy machinery for 24 hours (because of the sedation medicines used during the test).      SYMPTOMS TO REPORT IMMEDIATELY: A gastroenterologist can be reached at any hour.  During normal business hours, 8:30 AM to  5:00 PM Monday through Friday, call [phone].  After hours and on weekends, please call the GI answering service at [phone] who will take a message and have the physician on call contact you.   Following lower endoscopy (colonoscopy or flexible sigmoidoscopy):  Excessive amounts of blood in the stool  Significant tenderness or worsening of abdominal pains  Swelling of the abdomen that is new, acute  Fever of 100F or higher  FOLLOW UP: If any biopsies were taken you will be contacted by phone or by letter within the next 1-3 weeks.  Call your gastroenterologist if you have not heard about the biopsies in 3 weeks.  Our staff will call the home number listed on your records the next business day following your procedure to check on you and address any questions or concerns that you may have at that time regarding the information given to you following your procedure. This is a courtesy call and so if there is no answer at the home number and we have not heard from you through the emergency physician on call, we will assume that you have returned to your regular daily activities without incident.  SIGNATURES/CONFIDENTIALITY: You and/or your care partner have signed paperwork which will be entered into your electronic medical record.  These signatures attest to the fact that that the information above on your After Visit Summary has been reviewed and is understood.  Full responsibility of the confidentiality of this discharge information lies with you and/or your care-partner.  Please, see a Cardiologist due to your irregular heart beats.  I have enclosed a copy of your heart monitor strip during your recovery period. Read all of the handouts given to you by your recovery room nurse.

## 2014-04-26 NOTE — Progress Notes (Signed)
Stable to RR 

## 2014-04-27 ENCOUNTER — Telehealth: Payer: Self-pay

## 2014-04-27 NOTE — Telephone Encounter (Signed)
Left message on answering machine. 

## 2014-04-28 ENCOUNTER — Encounter: Payer: Self-pay | Admitting: Internal Medicine

## 2014-04-28 ENCOUNTER — Ambulatory Visit (INDEPENDENT_AMBULATORY_CARE_PROVIDER_SITE_OTHER): Payer: Medicare Other | Admitting: Internal Medicine

## 2014-04-28 ENCOUNTER — Other Ambulatory Visit (INDEPENDENT_AMBULATORY_CARE_PROVIDER_SITE_OTHER): Payer: Medicare Other

## 2014-04-28 VITALS — BP 136/80 | HR 72 | Temp 98.2°F | Resp 12 | Wt 171.4 lb

## 2014-04-28 DIAGNOSIS — E782 Mixed hyperlipidemia: Secondary | ICD-10-CM | POA: Diagnosis not present

## 2014-04-28 DIAGNOSIS — I491 Atrial premature depolarization: Secondary | ICD-10-CM

## 2014-04-28 LAB — BASIC METABOLIC PANEL
BUN: 20 mg/dL (ref 6–23)
CO2: 26 mEq/L (ref 19–32)
CREATININE: 1.2 mg/dL (ref 0.4–1.5)
Calcium: 9.3 mg/dL (ref 8.4–10.5)
Chloride: 106 mEq/L (ref 96–112)
GFR: 64.45 mL/min (ref >60.00)
Glucose, Bld: 117 mg/dL — ABNORMAL HIGH (ref 70–99)
Potassium: 3.9 mEq/L (ref 3.5–5.1)
Sodium: 141 mEq/L (ref 135–145)

## 2014-04-28 LAB — MAGNESIUM: MAGNESIUM: 1.9 mg/dL (ref 1.5–2.5)

## 2014-04-28 LAB — TSH: TSH: 4.58 u[IU]/mL — AB (ref 0.35–4.50)

## 2014-04-28 NOTE — Progress Notes (Signed)
   Subjective:    Patient ID: Walter Hess, male    DOB: 29-Nov-1940, 73 y.o.   MRN: [id number]  HPI   He underwent colonoscopy 04/26/14; the procedure was uncomplicated. It did show mild diverticulosis.  Rhythm strips initially showed a single PVC. Subsequent rhythm strips revealed PACs every third beat with a pause following the PAC.  Review of EKGs 11/01/10 and 01/13/12 revealed no PACs. The 01/13/13 EKG did show the phenomenon of a PAC every third beat. The underlying rate was slow.  He exercises on the treadmill 20 minutes 5 days a week  and does 90 repetitions with weights 3 days a week. He has no cardiovascular symptoms with this high level of exercise.  He denies intake of excessive stimulants.  01/19/14 BMET  & TSH normal.    Review of Systems   Chest pain, palpitations, tachycardia, exertional dyspnea, paroxysmal nocturnal dyspnea, claudication or edema are absent.       Objective:   Physical Exam  Appears healthy and well-nourished & in no acute distress  No carotid bruits are present.No neck vein distention present at 10 - 15 degrees. Thyroid normal to palpation  Heart rhythm and rate are normal with no gallop or murmur. No premature beats.  Chest is clear with no increased work of breathing  There is no evidence of aortic aneurysm or renal artery bruits  Abdomen soft with no organomegaly or masses. No HJR  No clubbing, cyanosis or edema present.  Pedal pulses are intact   No ischemic skin changes are present . Fingernails healthy   Alert and oriented. Strength, tone, DTRs reflexes normal         Assessment & Plan:  #1 PACs See orders

## 2014-04-28 NOTE — Patient Instructions (Addendum)
The  Stress Test will be scheduled and you'll be notified of the time.Please call the Referral Co-Ordinator @ [phone] if you have not been notified of appointment time within 7-10 days.  To prevent palpitations or premature beats, avoid stimulants such as decongestants, diet pills, nicotine, or caffeine (coffee, tea, cola, or chocolate) to excess.   Your next office appointment will be determined based upon review of your pending labs & stress test. Those instructions will be transmitted to you through My Chart  Followup as needed for your acute issue. Please report any significant change in your symptoms.

## 2014-04-28 NOTE — Progress Notes (Signed)
Pre visit review using our clinic review tool, if applicable. No additional management support is needed unless otherwise documented below in the visit note. 

## 2014-05-02 ENCOUNTER — Other Ambulatory Visit: Payer: Self-pay | Admitting: Internal Medicine

## 2014-05-02 DIAGNOSIS — R946 Abnormal results of thyroid function studies: Secondary | ICD-10-CM

## 2014-05-05 DIAGNOSIS — H25813 Combined forms of age-related cataract, bilateral: Secondary | ICD-10-CM | POA: Diagnosis not present

## 2014-06-03 ENCOUNTER — Telehealth (HOSPITAL_COMMUNITY): Payer: Self-pay

## 2014-06-03 NOTE — Telephone Encounter (Signed)
Encounter complete. 

## 2014-06-08 ENCOUNTER — Ambulatory Visit (HOSPITAL_COMMUNITY)
Admission: RE | Admit: 2014-06-08 | Discharge: 2014-06-08 | Disposition: A | Discharge status: Home / Self Care | Payer: Medicare Other | Source: Ambulatory Visit | Attending: Internal Medicine | Admitting: Internal Medicine

## 2014-06-08 DIAGNOSIS — I491 Atrial premature depolarization: Secondary | ICD-10-CM

## 2014-06-08 NOTE — Procedures (Signed)
Exercise Treadmill Test    Test  Exercise Tolerance Test Ordering MD: Unice Cobble, MD  Interpreting MD:   Unique Test No: 1  Treadmill:  1  Indication for ETT: PACs, PVCs  Contraindication to ETT: No   Stress Modality: exercise - treadmill  Cardiac Imaging Performed: non   Protocol: standard Bruce - maximal  Max BP:  201/83  Max MPHR (bpm): 145 85% MPR (bpm):  123  MPHR obtained (bpm):  173 % MPHR obtained:  119  Reached 85% MPHR (min:sec): 5:50 Total Exercise Time (min-sec):  10:29  Workload in METS:  12.50 Borg Scale:   Reason ETT Terminated:  fatigue    ST Segment Analysis At Rest: normal ST segments - no evidence of significant ST depression With Exercise: borderline ST changes  Other Information Arrhythmia:  No Angina during ETT:  absent (0) Quality of ETT:  diagnostic  ETT Interpretation:  borderline (indeterminate) with non-specific ST changes  Comments: <1 mm horizontal ST depression inferiorly at peak exercise - persisted into recovery Hypertensive response to exercise with SBP >200 Despite this, excellent exercise tolerance (12.5 METS) No chest pain was reported  Pixie Casino, MD, [facility] Attending Cardiologist [place]

## 2014-06-19 ENCOUNTER — Encounter: Payer: Self-pay | Admitting: Internal Medicine

## 2014-06-20 ENCOUNTER — Other Ambulatory Visit: Payer: Self-pay | Admitting: Internal Medicine

## 2014-06-20 MED ORDER — METOPROLOL TARTRATE 25 MG PO TABS: ORAL_TABLET | ORAL | Status: DC

## 2014-06-23 ENCOUNTER — Encounter: Payer: Self-pay | Admitting: Internal Medicine

## 2014-07-01 ENCOUNTER — Ambulatory Visit (INDEPENDENT_AMBULATORY_CARE_PROVIDER_SITE_OTHER): Payer: Medicare Other | Admitting: *Deleted

## 2014-07-01 DIAGNOSIS — Z23 Encounter for immunization: Secondary | ICD-10-CM

## 2014-07-19 ENCOUNTER — Encounter: Payer: Self-pay | Admitting: Internal Medicine

## 2014-07-20 ENCOUNTER — Other Ambulatory Visit (INDEPENDENT_AMBULATORY_CARE_PROVIDER_SITE_OTHER): Payer: Medicare Other

## 2014-07-20 DIAGNOSIS — R946 Abnormal results of thyroid function studies: Secondary | ICD-10-CM | POA: Diagnosis not present

## 2014-07-20 LAB — TSH: TSH: 3.4 u[IU]/mL (ref 0.35–4.50)

## 2014-07-20 LAB — T3, FREE: T3, Free: 3.7 pg/mL (ref 2.3–4.2)

## 2014-07-20 LAB — T4, FREE: Free T4: 0.66 ng/dL (ref 0.60–1.60)

## 2014-08-18 DIAGNOSIS — L400 Psoriasis vulgaris: Secondary | ICD-10-CM | POA: Diagnosis not present

## 2014-08-18 DIAGNOSIS — L821 Other seborrheic keratosis: Secondary | ICD-10-CM | POA: Diagnosis not present

## 2014-11-10 DIAGNOSIS — H2589 Other age-related cataract: Secondary | ICD-10-CM | POA: Diagnosis not present

## 2014-11-10 DIAGNOSIS — H25813 Combined forms of age-related cataract, bilateral: Secondary | ICD-10-CM | POA: Diagnosis not present

## 2014-11-10 DIAGNOSIS — H25819 Combined forms of age-related cataract, unspecified eye: Secondary | ICD-10-CM | POA: Diagnosis not present

## 2014-12-19 ENCOUNTER — Other Ambulatory Visit: Payer: Self-pay

## 2014-12-29 ENCOUNTER — Other Ambulatory Visit: Payer: Self-pay | Admitting: Emergency Medicine

## 2014-12-29 DIAGNOSIS — E782 Mixed hyperlipidemia: Secondary | ICD-10-CM

## 2014-12-29 MED ORDER — PRAVASTATIN SODIUM 40 MG PO TABS: ORAL_TABLET | ORAL | Status: DC

## 2015-01-12 DIAGNOSIS — N401 Enlarged prostate with lower urinary tract symptoms: Secondary | ICD-10-CM | POA: Diagnosis not present

## 2015-01-12 DIAGNOSIS — R972 Elevated prostate specific antigen [PSA]: Secondary | ICD-10-CM | POA: Diagnosis not present

## 2015-01-23 ENCOUNTER — Encounter: Payer: Self-pay | Admitting: Internal Medicine

## 2015-01-23 ENCOUNTER — Ambulatory Visit (INDEPENDENT_AMBULATORY_CARE_PROVIDER_SITE_OTHER)
Admission: RE | Admit: 2015-01-23 | Discharge: 2015-01-23 | Disposition: A | Discharge status: Home / Self Care | Payer: Medicare Other | Source: Ambulatory Visit | Attending: Internal Medicine | Admitting: Internal Medicine

## 2015-01-23 ENCOUNTER — Other Ambulatory Visit: Payer: Self-pay | Admitting: Internal Medicine

## 2015-01-23 ENCOUNTER — Other Ambulatory Visit (INDEPENDENT_AMBULATORY_CARE_PROVIDER_SITE_OTHER): Payer: Medicare Other

## 2015-01-23 ENCOUNTER — Ambulatory Visit (INDEPENDENT_AMBULATORY_CARE_PROVIDER_SITE_OTHER): Payer: Medicare Other | Admitting: Internal Medicine

## 2015-01-23 VITALS — BP 120/72 | HR 67 | Temp 97.9°F | Resp 16 | Ht 71.0 in | Wt 170.0 lb

## 2015-01-23 DIAGNOSIS — E782 Mixed hyperlipidemia: Secondary | ICD-10-CM

## 2015-01-23 DIAGNOSIS — Z862 Personal history of diseases of the blood and blood-forming organs and certain disorders involving the immune mechanism: Secondary | ICD-10-CM

## 2015-01-23 DIAGNOSIS — R911 Solitary pulmonary nodule: Secondary | ICD-10-CM

## 2015-01-23 DIAGNOSIS — R0989 Other specified symptoms and signs involving the circulatory and respiratory systems: Secondary | ICD-10-CM

## 2015-01-23 DIAGNOSIS — R9431 Abnormal electrocardiogram [ECG] [EKG]: Secondary | ICD-10-CM

## 2015-01-23 DIAGNOSIS — R918 Other nonspecific abnormal finding of lung field: Secondary | ICD-10-CM | POA: Diagnosis not present

## 2015-01-23 DIAGNOSIS — Z23 Encounter for immunization: Secondary | ICD-10-CM

## 2015-01-23 DIAGNOSIS — R0689 Other abnormalities of breathing: Secondary | ICD-10-CM

## 2015-01-23 LAB — CBC WITH DIFFERENTIAL/PLATELET
Basophils Absolute: 0 10*3/uL (ref 0.0–0.1)
Basophils Relative: 0.5 % (ref 0.0–3.0)
Eosinophils Absolute: 0.5 10*3/uL (ref 0.0–0.7)
Eosinophils Relative: 5.4 % — ABNORMAL HIGH (ref 0.0–5.0)
HEMATOCRIT: 43.8 % (ref 39.0–52.0)
Hemoglobin: 15.3 g/dL (ref 13.0–17.0)
LYMPHS ABS: 1.5 10*3/uL (ref 0.7–4.0)
LYMPHS PCT: 16.9 % (ref 12.0–46.0)
MCHC: 35 g/dL (ref 30.0–36.0)
MCV: 97.2 fl (ref 78.0–100.0)
MONOS PCT: 9 % (ref 3.0–12.0)
Monocytes Absolute: 0.8 10*3/uL (ref 0.1–1.0)
NEUTROS ABS: 5.9 10*3/uL (ref 1.4–7.7)
Neutrophils Relative %: 68.2 % (ref 43.0–77.0)
Platelets: 214 10*3/uL (ref 150.0–400.0)
RBC: 4.51 Mil/uL (ref 4.22–5.81)
RDW: 13.4 % (ref 11.5–15.5)
WBC: 8.7 10*3/uL (ref 4.0–10.5)

## 2015-01-23 LAB — LIPID PANEL
Cholesterol: 131 mg/dL (ref 0–200)
HDL: 47.3 mg/dL (ref >39.00)
LDL Cholesterol: 67 mg/dL (ref 0–99)
NONHDL: 83.89
Total CHOL/HDL Ratio: 3
Triglycerides: 84 mg/dL (ref 0.0–149.0)
VLDL: 16.8 mg/dL (ref 0.0–40.0)

## 2015-01-23 LAB — BASIC METABOLIC PANEL
BUN: 20 mg/dL (ref 6–23)
CO2: 27 mEq/L (ref 19–32)
CREATININE: 1.21 mg/dL (ref 0.40–1.50)
Calcium: 9.5 mg/dL (ref 8.4–10.5)
Chloride: 104 mEq/L (ref 96–112)
GFR: 61.87 mL/min (ref >60.00)
Glucose, Bld: 116 mg/dL — ABNORMAL HIGH (ref 70–99)
Potassium: 4.3 mEq/L (ref 3.5–5.1)
Sodium: 142 mEq/L (ref 135–145)

## 2015-01-23 LAB — TSH: TSH: 3.96 u[IU]/mL (ref 0.35–4.50)

## 2015-01-23 LAB — HEPATIC FUNCTION PANEL
ALT: 17 U/L (ref 0–53)
AST: 21 U/L (ref 0–37)
Albumin: 4.2 g/dL (ref 3.5–5.2)
Alkaline Phosphatase: 36 U/L — ABNORMAL LOW (ref 39–117)
Bilirubin, Direct: 0.3 mg/dL (ref 0.0–0.3)
Total Bilirubin: 1.1 mg/dL (ref 0.2–1.2)
Total Protein: 7.3 g/dL (ref 6.0–8.3)

## 2015-01-23 NOTE — Progress Notes (Signed)
   Subjective:    Patient ID: Walter Hess, male    DOB: August 12, 1940, 74 y.o.   MRN: [id number]  HPI The patient is here to assess status of active health conditions.  PMH, FH, & Social History reviewed & updated.No change in [place] as recorded.  He is on a heart healthy diet; he does eat some fried foods. He exercises for 25 minutes on a treadmill 7 days a week without associated symptoms.  He has been compliant with his medicines without adverse effects.  He's been followed by Dr. Amalia Hailey, urologist for elevated PSA. It was recently checked and the value was 1.37. He has no active genitourinary symptoms  He has aged out of colonoscopies. He has no active GI symptoms.  He smoked 1957-1964, up to 1 pack per day. He rarely drinks alcohol.    Review of Systems  Chest pain, palpitations, tachycardia, exertional dyspnea, paroxysmal nocturnal dyspnea, claudication or edema are absent. No unexplained weight loss, abdominal pain, significant dyspepsia, dysphagia, melena, rectal bleeding, or persistently small caliber stools. Dysuria, pyuria, hematuria, frequency, nocturia or polyuria are denied. Change in hair, skin, nails denied. No bowel changes of constipation or diarrhea. No intolerance to heat or cold.    Objective:   Physical Exam  Pertinent or positive findings include: He has pattern alopecia. Decreased hearing is noted on the left. First heart sound is accentuated. He has occasional premature beats. Persistent low grade rales @ bases. There are isolated DIP changes in the hands. Dupuytran's contractures are present bilaterally, greater in the right palm than in the left. Genitourinary exam deferred to Dr. Amalia Hailey.  General appearance :adequately nourished; in no distress.  Eyes: No conjunctival inflammation or scleral icterus is present.  Oral exam:  Lips and gums are healthy appearing.There is no oropharyngeal erythema or exudate noted. Dental hygiene is good.  Heart:  Normal rate  and regular rhythm.  S2 normal without gallop, murmur, click, or rub.    Lungs:no wheezes, rhonchi,or rubs present.No increased work of breathing.   Abdomen: bowel sounds normal, soft and non-tender without masses, organomegaly or hernias noted.  No guarding or rebound.   Vascular : all pulses equal ; no bruits present.  Skin:Warm & dry.  Intact without suspicious lesions or rashes ; no tenting or jaundice   Lymphatic: No lymphadenopathy is noted about the head, neck, axilla   Neuro: Strength, tone & DTRs normal.        Assessment & Plan:  See Current Assessment & Plan in Problem List under specific Diagnosis

## 2015-01-23 NOTE — Assessment & Plan Note (Addendum)
Lipids, LFTs, TSH  

## 2015-01-23 NOTE — Patient Instructions (Addendum)
  Your next office appointment will be determined based upon review of your pending labs  and  xrays  Those written interpretation of the lab results and instructions will be transmitted to you by My Chart   Critical results will be called.   Followup as needed for any active or acute issue. Please report any significant change in your symptoms.  To prevent palpitations or premature beats, avoid stimulants such as decongestants, diet pills, nicotine, or caffeine (coffee, tea, cola, or chocolate) to excess.

## 2015-01-23 NOTE — Assessment & Plan Note (Signed)
CBC and differential 

## 2015-01-23 NOTE — Progress Notes (Signed)
Pre visit review using our clinic review tool, if applicable. No additional management support is needed unless otherwise documented below in the visit note. 

## 2015-01-23 NOTE — Assessment & Plan Note (Signed)
BMET, TSH 

## 2015-01-25 ENCOUNTER — Other Ambulatory Visit (INDEPENDENT_AMBULATORY_CARE_PROVIDER_SITE_OTHER): Payer: Medicare Other

## 2015-01-25 DIAGNOSIS — R739 Hyperglycemia, unspecified: Secondary | ICD-10-CM

## 2015-01-25 LAB — HEMOGLOBIN A1C: HEMOGLOBIN A1C: 6 % (ref 4.6–6.5)

## 2015-01-26 ENCOUNTER — Other Ambulatory Visit: Payer: Self-pay | Admitting: Emergency Medicine

## 2015-01-26 DIAGNOSIS — E782 Mixed hyperlipidemia: Secondary | ICD-10-CM

## 2015-01-26 MED ORDER — PRAVASTATIN SODIUM 40 MG PO TABS: ORAL_TABLET | ORAL | Status: DC

## 2015-02-21 DIAGNOSIS — L820 Inflamed seborrheic keratosis: Secondary | ICD-10-CM | POA: Diagnosis not present

## 2015-02-21 DIAGNOSIS — D485 Neoplasm of uncertain behavior of skin: Secondary | ICD-10-CM | POA: Diagnosis not present

## 2015-02-21 DIAGNOSIS — D1801 Hemangioma of skin and subcutaneous tissue: Secondary | ICD-10-CM | POA: Diagnosis not present

## 2015-02-21 DIAGNOSIS — L400 Psoriasis vulgaris: Secondary | ICD-10-CM | POA: Diagnosis not present

## 2015-03-14 DIAGNOSIS — Z23 Encounter for immunization: Secondary | ICD-10-CM | POA: Diagnosis not present

## 2015-05-11 DIAGNOSIS — H40013 Open angle with borderline findings, low risk, bilateral: Secondary | ICD-10-CM | POA: Diagnosis not present

## 2015-05-11 DIAGNOSIS — H52223 Regular astigmatism, bilateral: Secondary | ICD-10-CM | POA: Diagnosis not present

## 2015-05-11 DIAGNOSIS — H5213 Myopia, bilateral: Secondary | ICD-10-CM | POA: Diagnosis not present

## 2015-05-11 DIAGNOSIS — H25813 Combined forms of age-related cataract, bilateral: Secondary | ICD-10-CM | POA: Diagnosis not present

## 2015-05-11 DIAGNOSIS — H40053 Ocular hypertension, bilateral: Secondary | ICD-10-CM | POA: Diagnosis not present

## 2015-06-22 ENCOUNTER — Encounter: Payer: Self-pay | Admitting: *Deleted

## 2015-06-25 HISTORY — PX: CATARACT EXTRACTION, BILATERAL: SHX1313

## 2015-07-07 ENCOUNTER — Other Ambulatory Visit (INDEPENDENT_AMBULATORY_CARE_PROVIDER_SITE_OTHER): Payer: Medicare Other

## 2015-07-07 ENCOUNTER — Telehealth: Payer: Self-pay | Admitting: Internal Medicine

## 2015-07-07 ENCOUNTER — Other Ambulatory Visit: Payer: Self-pay | Admitting: Internal Medicine

## 2015-07-07 DIAGNOSIS — E785 Hyperlipidemia, unspecified: Secondary | ICD-10-CM

## 2015-07-07 DIAGNOSIS — H18412 Arcus senilis, left eye: Secondary | ICD-10-CM | POA: Diagnosis not present

## 2015-07-07 DIAGNOSIS — H02839 Dermatochalasis of unspecified eye, unspecified eyelid: Secondary | ICD-10-CM | POA: Diagnosis not present

## 2015-07-07 DIAGNOSIS — H18411 Arcus senilis, right eye: Secondary | ICD-10-CM | POA: Diagnosis not present

## 2015-07-07 DIAGNOSIS — H2511 Age-related nuclear cataract, right eye: Secondary | ICD-10-CM | POA: Diagnosis not present

## 2015-07-07 DIAGNOSIS — H2512 Age-related nuclear cataract, left eye: Secondary | ICD-10-CM | POA: Diagnosis not present

## 2015-07-07 LAB — COMPREHENSIVE METABOLIC PANEL
ALT: 17 U/L (ref 0–53)
AST: 21 U/L (ref 0–37)
Albumin: 4 g/dL (ref 3.5–5.2)
Alkaline Phosphatase: 37 U/L — ABNORMAL LOW (ref 39–117)
BILIRUBIN TOTAL: 0.6 mg/dL (ref 0.2–1.2)
BUN: 14 mg/dL (ref 6–23)
CALCIUM: 9.4 mg/dL (ref 8.4–10.5)
CO2: 30 meq/L (ref 19–32)
CREATININE: 1.12 mg/dL (ref 0.40–1.50)
Chloride: 104 mEq/L (ref 96–112)
GFR: 67.57 mL/min (ref >60.00)
GLUCOSE: 124 mg/dL — AB (ref 70–99)
Potassium: 4.3 mEq/L (ref 3.5–5.1)
Sodium: 139 mEq/L (ref 135–145)
Total Protein: 7 g/dL (ref 6.0–8.3)

## 2015-07-07 NOTE — Telephone Encounter (Signed)
Blood work back and reviewed - normal.  Please fax

## 2015-07-07 NOTE — Telephone Encounter (Signed)
1.13.17 Pt requests that the results of the blood work taken today be faxed to Dr. Talbert Forest at [facility] and [address]. Please advise MS

## 2015-07-07 NOTE — Telephone Encounter (Signed)
Blood work faxed to [facility]. Pt has been notified.

## 2015-07-17 ENCOUNTER — Other Ambulatory Visit: Payer: Self-pay | Admitting: Ophthalmology

## 2015-07-17 DIAGNOSIS — H518 Other specified disorders of binocular movement: Secondary | ICD-10-CM

## 2015-07-21 ENCOUNTER — Ambulatory Visit
Admission: RE | Admit: 2015-07-21 | Discharge: 2015-07-21 | Disposition: A | Discharge status: Home / Self Care | Payer: Medicare Other | Source: Ambulatory Visit | Attending: Ophthalmology | Admitting: Ophthalmology

## 2015-07-21 DIAGNOSIS — H532 Diplopia: Secondary | ICD-10-CM | POA: Diagnosis not present

## 2015-07-21 DIAGNOSIS — H518 Other specified disorders of binocular movement: Secondary | ICD-10-CM

## 2015-07-21 MED ORDER — GADOBENATE DIMEGLUMINE 529 MG/ML IV SOLN
16.0000 mL | Freq: Once | INTRAVENOUS | Status: AC | PRN
  Administered 2015-07-21: 16 mL via INTRAVENOUS

## 2015-08-07 DIAGNOSIS — H25012 Cortical age-related cataract, left eye: Secondary | ICD-10-CM | POA: Diagnosis not present

## 2015-08-07 DIAGNOSIS — H2512 Age-related nuclear cataract, left eye: Secondary | ICD-10-CM | POA: Diagnosis not present

## 2015-08-07 DIAGNOSIS — H25812 Combined forms of age-related cataract, left eye: Secondary | ICD-10-CM | POA: Diagnosis not present

## 2015-08-08 DIAGNOSIS — H25011 Cortical age-related cataract, right eye: Secondary | ICD-10-CM | POA: Diagnosis not present

## 2015-08-25 DIAGNOSIS — H25811 Combined forms of age-related cataract, right eye: Secondary | ICD-10-CM | POA: Diagnosis not present

## 2015-08-25 DIAGNOSIS — H2511 Age-related nuclear cataract, right eye: Secondary | ICD-10-CM | POA: Diagnosis not present

## 2015-08-30 DIAGNOSIS — D1801 Hemangioma of skin and subcutaneous tissue: Secondary | ICD-10-CM | POA: Diagnosis not present

## 2015-08-30 DIAGNOSIS — L400 Psoriasis vulgaris: Secondary | ICD-10-CM | POA: Diagnosis not present

## 2015-08-30 DIAGNOSIS — L820 Inflamed seborrheic keratosis: Secondary | ICD-10-CM | POA: Diagnosis not present

## 2015-08-30 DIAGNOSIS — L821 Other seborrheic keratosis: Secondary | ICD-10-CM | POA: Diagnosis not present

## 2015-08-30 DIAGNOSIS — L570 Actinic keratosis: Secondary | ICD-10-CM | POA: Diagnosis not present

## 2016-01-18 DIAGNOSIS — R972 Elevated prostate specific antigen [PSA]: Secondary | ICD-10-CM | POA: Diagnosis not present

## 2016-01-18 DIAGNOSIS — R351 Nocturia: Secondary | ICD-10-CM | POA: Diagnosis not present

## 2016-01-18 DIAGNOSIS — N401 Enlarged prostate with lower urinary tract symptoms: Secondary | ICD-10-CM | POA: Diagnosis not present

## 2016-01-25 ENCOUNTER — Encounter: Payer: Self-pay | Admitting: Internal Medicine

## 2016-01-25 ENCOUNTER — Ambulatory Visit (INDEPENDENT_AMBULATORY_CARE_PROVIDER_SITE_OTHER): Payer: Medicare Other | Admitting: Internal Medicine

## 2016-01-25 ENCOUNTER — Other Ambulatory Visit (INDEPENDENT_AMBULATORY_CARE_PROVIDER_SITE_OTHER): Payer: Medicare Other

## 2016-01-25 ENCOUNTER — Ambulatory Visit (INDEPENDENT_AMBULATORY_CARE_PROVIDER_SITE_OTHER)
Admission: RE | Admit: 2016-01-25 | Discharge: 2016-01-25 | Disposition: A | Discharge status: Home / Self Care | Payer: Medicare Other | Source: Ambulatory Visit | Attending: Internal Medicine | Admitting: Internal Medicine

## 2016-01-25 VITALS — BP 132/62 | HR 62 | Temp 97.8°F | Ht 71.0 in | Wt 160.5 lb

## 2016-01-25 DIAGNOSIS — E782 Mixed hyperlipidemia: Secondary | ICD-10-CM

## 2016-01-25 DIAGNOSIS — R9389 Abnormal findings on diagnostic imaging of other specified body structures: Secondary | ICD-10-CM

## 2016-01-25 DIAGNOSIS — Z862 Personal history of diseases of the blood and blood-forming organs and certain disorders involving the immune mechanism: Secondary | ICD-10-CM

## 2016-01-25 DIAGNOSIS — R938 Abnormal findings on diagnostic imaging of other specified body structures: Secondary | ICD-10-CM

## 2016-01-25 DIAGNOSIS — R9431 Abnormal electrocardiogram [ECG] [EKG]: Secondary | ICD-10-CM | POA: Diagnosis not present

## 2016-01-25 DIAGNOSIS — R7303 Prediabetes: Secondary | ICD-10-CM

## 2016-01-25 DIAGNOSIS — E119 Type 2 diabetes mellitus without complications: Secondary | ICD-10-CM | POA: Insufficient documentation

## 2016-01-25 DIAGNOSIS — R972 Elevated prostate specific antigen [PSA]: Secondary | ICD-10-CM

## 2016-01-25 DIAGNOSIS — G450 Vertebro-basilar artery syndrome: Secondary | ICD-10-CM | POA: Diagnosis not present

## 2016-01-25 DIAGNOSIS — Z0000 Encounter for general adult medical examination without abnormal findings: Secondary | ICD-10-CM | POA: Diagnosis not present

## 2016-01-25 DIAGNOSIS — J449 Chronic obstructive pulmonary disease, unspecified: Secondary | ICD-10-CM | POA: Diagnosis not present

## 2016-01-25 DIAGNOSIS — N1831 Chronic kidney disease, stage 3a: Secondary | ICD-10-CM | POA: Insufficient documentation

## 2016-01-25 LAB — CBC WITH DIFFERENTIAL/PLATELET
BASOS PCT: 0.3 % (ref 0.0–3.0)
Basophils Absolute: 0 10*3/uL (ref 0.0–0.1)
EOS ABS: 0.4 10*3/uL (ref 0.0–0.7)
EOS PCT: 4.1 % (ref 0.0–5.0)
HEMATOCRIT: 42.3 % (ref 39.0–52.0)
HEMOGLOBIN: 14.8 g/dL (ref 13.0–17.0)
LYMPHS PCT: 14.4 % (ref 12.0–46.0)
Lymphs Abs: 1.3 10*3/uL (ref 0.7–4.0)
MCHC: 34.9 g/dL (ref 30.0–36.0)
MCV: 95.4 fl (ref 78.0–100.0)
Monocytes Absolute: 0.7 10*3/uL (ref 0.1–1.0)
Monocytes Relative: 7.4 % (ref 3.0–12.0)
Neutro Abs: 6.7 10*3/uL (ref 1.4–7.7)
Neutrophils Relative %: 73.8 % (ref 43.0–77.0)
Platelets: 231 10*3/uL (ref 150.0–400.0)
RBC: 4.43 Mil/uL (ref 4.22–5.81)
RDW: 13.3 % (ref 11.5–15.5)
WBC: 9 10*3/uL (ref 4.0–10.5)

## 2016-01-25 LAB — COMPREHENSIVE METABOLIC PANEL
ALBUMIN: 4.1 g/dL (ref 3.5–5.2)
ALK PHOS: 37 U/L — AB (ref 39–117)
ALT: 15 U/L (ref 0–53)
AST: 20 U/L (ref 0–37)
BILIRUBIN TOTAL: 1 mg/dL (ref 0.2–1.2)
BUN: 18 mg/dL (ref 6–23)
CALCIUM: 9.5 mg/dL (ref 8.4–10.5)
CHLORIDE: 107 meq/L (ref 96–112)
CO2: 28 mEq/L (ref 19–32)
CREATININE: 1.25 mg/dL (ref 0.40–1.50)
GFR: 59.44 mL/min — ABNORMAL LOW (ref >60.00)
Glucose, Bld: 127 mg/dL — ABNORMAL HIGH (ref 70–99)
Potassium: 5.2 mEq/L — ABNORMAL HIGH (ref 3.5–5.1)
Sodium: 141 mEq/L (ref 135–145)
Total Protein: 7.3 g/dL (ref 6.0–8.3)

## 2016-01-25 LAB — LIPID PANEL
CHOLESTEROL: 105 mg/dL (ref 0–200)
HDL: 42.8 mg/dL (ref >39.00)
LDL Cholesterol: 49 mg/dL (ref 0–99)
NonHDL: 62.29
Total CHOL/HDL Ratio: 2
Triglycerides: 65 mg/dL (ref 0.0–149.0)
VLDL: 13 mg/dL (ref 0.0–40.0)

## 2016-01-25 LAB — HEMOGLOBIN A1C: HEMOGLOBIN A1C: 6 % (ref 4.6–6.5)

## 2016-01-25 LAB — TSH: TSH: 2.7 u[IU]/mL (ref 0.35–4.50)

## 2016-01-25 MED ORDER — PRAVASTATIN SODIUM 40 MG PO TABS: ORAL_TABLET | ORAL | 1 refills | Status: DC

## 2016-01-25 NOTE — Assessment & Plan Note (Signed)
Will recheck EKG today

## 2016-01-25 NOTE — Assessment & Plan Note (Addendum)
Check a1c Continue regular exercise 

## 2016-01-25 NOTE — Assessment & Plan Note (Signed)
Check cbc 

## 2016-01-25 NOTE — Progress Notes (Signed)
Pre visit review using our clinic review tool, if applicable. No additional management support is needed unless otherwise documented below in the visit note. 

## 2016-01-25 NOTE — Patient Instructions (Addendum)
  [name] , Thank you for taking time to come for your Medicare Wellness Visit. I appreciate your ongoing commitment to your health goals. Please review the following plan we discussed and let me know if I can assist you in the future.   These are the goals we discussed: Goals    None      This is a list of the screening recommended for you and due dates:  Health Maintenance  Topic Date Due  . Flu Shot  01/23/2016  . Tetanus Vaccine  07/01/2024  . Shingles Vaccine  Completed  . Pneumonia vaccines  Completed     Test(s) ordered today. Your results will be released to [place] (or called to you) after review, usually within 72hours after test completion. If any changes need to be made, you will be notified at that same time.  A chest xray was ordered today  An EKG was done today.   All other Health Maintenance issues reviewed.   All recommended immunizations and age-appropriate screenings are up-to-date or discussed.  No immunizations administered today.   Medications reviewed and updated.  No changes recommended at this time.  Your prescription(s) have been submitted to your pharmacy. Please take as directed and contact our office if you believe you are having problem(s) with the medication(s).  Please followup in one year for a wellness visit

## 2016-01-25 NOTE — Progress Notes (Signed)
Subjective:    Patient ID: Walter Hess, male    DOB: 01-21-1941, 75 y.o.   MRN: [id number]  HPI Here for medicare wellness exam.   There have been no changes in his health since he was here last.  He is concerned about his possible abnormal cxr from last year and wonders if that should be repeated.  He also was concerned about his EKG from a few years ago.  He denies palpitations.    He takes his cholesterol medications every other day.  He is exercising regularly.   I have personally reviewed and have noted 1.The patient's medical and social history 2.Their use of alcohol, tobacco or illicit drugs 3.Their current medications and supplements 4.The patient's functional ability including ADL's, fall risks, home safety risks and                 hearing or visual impairment. 5.Diet and physical activities 6.Evidence for depression or mood disorders 7.Care team reviewed and updated - urology - Dr Amalia Hailey, Derm  - Dr Ronnald Ramp   Are there smokers in your home (other than you)? No  Risk Factors Exercise: regular, walks on treadmill 25 minutes 6 days a week, sit ups Dietary issues discussed:   Cardiac risk factors: advanced age,  hyperlipidemia  Depression Screen  Have you felt down, depressed or hopeless? No  Have you felt little interest or pleasure in doing things?  No  Activities of Daily Living In your present state of health, do you have any difficulty performing the following activities?:  Driving? No Managing money?  No Feeding yourself? No Getting from bed to chair? No Climbing a flight of stairs? No Preparing food and eating?: No Bathing or showering? No Getting dressed: No Getting to/using the toilet? No Moving around from place to place: No In the past year have you fallen or had a near fall?: No   Are you sexually active?  Yes   Do you have more than one partner?  N/A  Hearing Difficulties: yes  in left ear Do you often ask people to speak up or repeat themselves? yes Do you experience ringing or noises in your ears? No Do you have difficulty understanding soft or whispered voices? yes Vision:              Any change in vision:  no             Up to date with eye exam:  Up to date   Memory:  Do you feel that you have a problem with memory? No, just names  Do you often misplace items? No  Do you feel safe at home?  Yes  Cognitive Testing  Alert, Orientated? Yes  Normal Appearance? Yes  Recall of three objects?  Yes  Can perform simple calculations? Yes  Displays appropriate judgment? Yes  Can read the correct time from a watch face? Yes   Advanced Directives have been discussed with the patient? Yes - in place   Medications and allergies reviewed with patient and updated if appropriate.  Patient Active Problem List   Diagnosis Date Noted  . Prediabetes 01/25/2016  . DUPUYTREN'S CONTRACTURE, RIGHT 10/05/2009  . History of anemia 09/22/2008  . HYPERLIPIDEMIA 06/24/2007  . DIVERTICULOSIS, COLON 06/24/2007  . PROSTATE SPECIFIC ANTIGEN( PSA), ELEVATED 06/24/2007  . NONSPECIFIC ABNORMAL ELECTROCARDIOGRAM 06/24/2007    Current Outpatient Prescriptions on File Prior to Visit  Medication Sig Dispense Refill  . Arginine 500 MG CAPS Take by mouth  daily.    . aspirin 81 MG tablet Take 81 mg by mouth daily.      . Cholecalciferol (VITAMIN D HIGH POTENCY PO) Take 1,000 mg by mouth daily.      . Coenzyme Q10 (COQ10) 100 MG CAPS Take by mouth daily.    . fish oil-omega-3 fatty acids 1000 MG capsule Take 1,200 mg by mouth daily.      . Multiple Vitamin (MULTIVITAMIN) tablet Take 1 tablet by mouth daily.      . pravastatin (PRAVACHOL) 40 MG tablet At bedtime daily as directed 90 tablet 1  . S-Adenosylmethionine (SAM-E) 200 MG TABS Take by mouth daily.      . Saw Palmetto 450 MG CAPS Take by mouth daily.      . Turmeric 500 MG CAPS Take by mouth daily.       No current  facility-administered medications on file prior to visit.     Past Medical History:  Diagnosis Date  . Anemia    former blood donor for decades; stopped 2006  . Diverticulosis 2005   Dr. Olevia Perches  . Elevated prostate specific antigen (PSA)    Dr. Amalia Hailey [facility]  . Gilbert's syndrome   . Hyperlipidemia     Past Surgical History:  Procedure Laterality Date  . COLONOSCOPY  2005   Diverticulosis, Dr.Brodie.  Marland Kitchen GANGLION CYST EXCISION  2005   5th L digit  . LUMBAR LAMINECTOMY  1981    Social History   Social History  . Marital status: Married    Spouse name: N/A  . Number of children: N/A  . Years of education: N/A   Social History Main Topics  . Smoking status: Former Smoker    Quit date: 06/24/1958  . Smokeless tobacco: Never Used     Comment: smoked 1957-64 ,up to 1 ppd  . Alcohol use 0.0 oz/week     Comment: beer occasionally  . Drug use: No  . Sexual activity: Not Asked   Other Topics Concern  . None   Social History Narrative  . None    Family History  Problem Relation Age of Onset  . Heart attack Father 51    former smoker  . Alzheimer's disease Mother   . Coronary artery disease Sister     open heart surgery  . Diabetes Paternal Aunt   . Heart attack Paternal Uncle 58  . Coronary artery disease Paternal Grandfather   . Prostate cancer Paternal Grandfather   . Prostate cancer Maternal Uncle   . Stroke Neg Hx   . Colon cancer Neg Hx     Review of Systems  Constitutional: Negative for appetite change, chills, fatigue, fever and unexpected weight change.  HENT: Positive for hearing loss. Negative for tinnitus.   Eyes: Negative for visual disturbance.  Respiratory: Negative for cough, shortness of breath and wheezing.   Cardiovascular: Negative for chest pain, palpitations and leg swelling.  Gastrointestinal: Negative for abdominal pain, blood in stool, constipation, diarrhea and nausea.       No gerd  Genitourinary: Negative for difficulty urinating and  hematuria.  Musculoskeletal: Negative for arthralgias and back pain.  Neurological: Negative for dizziness, light-headedness and headaches.  Psychiatric/Behavioral: Negative for dysphoric mood. The patient is not nervous/anxious.        Objective:   Vitals:   01/25/16 0852  BP: 132/62  Pulse: 62  Temp: 97.8 F (36.6 C)   Filed Weights   01/25/16 0852  Weight: 160 lb 8 oz (72.8 kg)  Body mass index is 22.39 kg/m.   Physical Exam Constitutional: He appears well-developed and well-nourished. No distress.  HENT:  Head: Normocephalic and atraumatic.  Right Ear: External ear normal.  Left Ear: External ear normal.  Mouth/Throat: Oropharynx is clear and moist.  Normal ear canals and TM b/l  Eyes: Conjunctivae and EOM are normal.  Neck: Neck supple. No tracheal deviation present. No thyromegaly present.  No carotid bruit  Cardiovascular: Normal rate, regular rhythm, normal heart sounds and intact distal pulses.   No murmur heard. Pulmonary/Chest: Effort normal and breath sounds normal. No respiratory distress. He has no wheezes. He has no rales.  Abdominal: Soft. Bowel sounds are normal. He exhibits no distension. There is no tenderness.  Genitourinary: deferred to urology Musculoskeletal: He exhibits no edema.  Lymphadenopathy:   He has no cervical adenopathy.  Skin: Skin is warm and dry. He is not diaphoretic.  Psychiatric: He has a normal mood and affect. His behavior is normal.       Assessment & Plan:   Wellness Exam: Immunizations  Up to date  Colonoscopy  No longer needed at his age Eye exam  Up to date  Hearing loss  - mild, will consider hearing aids in the next year or so Memory concerns/difficulties - none, beyond just recall of names, words - normal for age 35 of ADLs - fully Stressed the importance of regular exercise, which he is doing   Patient received copy of preventative screening tests/immunizations recommended for the next 5-10  years.   See Problem List for Assessment and Plan of chronic medical problems.   Binnie Rail, MD

## 2016-01-25 NOTE — Assessment & Plan Note (Signed)
No recurrences, avoid that position

## 2016-01-25 NOTE — Assessment & Plan Note (Signed)
Sees urology annually °

## 2016-01-25 NOTE — Assessment & Plan Note (Addendum)
Abnormal cxr one year ago - ? Nodule vs nipple shadow asymptomatic Will recheck today

## 2016-01-25 NOTE — Assessment & Plan Note (Signed)
Check lipid panel, tsh Continue pravastatin every other day

## 2016-01-28 ENCOUNTER — Other Ambulatory Visit: Payer: Self-pay | Admitting: Internal Medicine

## 2016-01-28 DIAGNOSIS — R911 Solitary pulmonary nodule: Secondary | ICD-10-CM

## 2016-01-29 ENCOUNTER — Encounter: Payer: Self-pay | Admitting: Internal Medicine

## 2016-01-30 ENCOUNTER — Encounter: Payer: Self-pay | Admitting: Internal Medicine

## 2016-01-30 DIAGNOSIS — N189 Chronic kidney disease, unspecified: Secondary | ICD-10-CM | POA: Insufficient documentation

## 2016-01-30 DIAGNOSIS — N1831 Chronic kidney disease, stage 3a: Secondary | ICD-10-CM | POA: Insufficient documentation

## 2016-02-05 ENCOUNTER — Ambulatory Visit (INDEPENDENT_AMBULATORY_CARE_PROVIDER_SITE_OTHER)
Admission: RE | Admit: 2016-02-05 | Discharge: 2016-02-05 | Disposition: A | Discharge status: Home / Self Care | Payer: Medicare Other | Source: Ambulatory Visit | Attending: Internal Medicine | Admitting: Internal Medicine

## 2016-02-05 DIAGNOSIS — R911 Solitary pulmonary nodule: Secondary | ICD-10-CM | POA: Diagnosis not present

## 2016-02-08 ENCOUNTER — Encounter: Payer: Self-pay | Admitting: Internal Medicine

## 2016-02-08 DIAGNOSIS — R9389 Abnormal findings on diagnostic imaging of other specified body structures: Secondary | ICD-10-CM

## 2016-02-08 DIAGNOSIS — Z23 Encounter for immunization: Secondary | ICD-10-CM | POA: Diagnosis not present

## 2016-02-15 ENCOUNTER — Encounter: Payer: Self-pay | Admitting: Internal Medicine

## 2016-02-29 ENCOUNTER — Encounter: Payer: Self-pay | Admitting: Internal Medicine

## 2016-02-29 ENCOUNTER — Ambulatory Visit (INDEPENDENT_AMBULATORY_CARE_PROVIDER_SITE_OTHER): Payer: Medicare Other | Admitting: Internal Medicine

## 2016-02-29 VITALS — BP 150/88 | HR 90 | Ht 71.0 in | Wt 169.8 lb

## 2016-02-29 DIAGNOSIS — J328 Other chronic sinusitis: Secondary | ICD-10-CM | POA: Diagnosis not present

## 2016-02-29 DIAGNOSIS — J8410 Pulmonary fibrosis, unspecified: Secondary | ICD-10-CM | POA: Insufficient documentation

## 2016-02-29 DIAGNOSIS — R911 Solitary pulmonary nodule: Secondary | ICD-10-CM

## 2016-02-29 DIAGNOSIS — J329 Chronic sinusitis, unspecified: Secondary | ICD-10-CM | POA: Insufficient documentation

## 2016-02-29 NOTE — Assessment & Plan Note (Addendum)
MRI [facility] 07/21/2015 . Obstructive pattern left maxillary and ethmoid sinusitis, may be chronic.  Clearly abnormal but minimal symptoms > ent f/u prn

## 2016-02-29 NOTE — Progress Notes (Signed)
Subjective:     Patient ID: Walter Hess, male   DOB: 01/15/1941,     MRN: [id number]  HPI   75 yowm never regular smoker stopped completely  in his 20's and no lower resp problems just sinus symptoms starting in 40s with recurrent infections  but no need for  specialty eval or need for rx then routine cxr suggested lung nodule in 01/2015 repeat in 01/2016 suggested PF so referred to pulmonary clinic 02/29/2016 by Dr  Walter Hess    02/29/2016 1st [facility] office visit/ Walter Hess   Chief Complaint  Patient presents with  . Pulmonary Consult    Referred by Dr. Billey Hess for abnormal cxr. Pt denies any respiratory co's today.    6 days a week does 5 degrees of elevation at 3-4 mph x 25 min s sob x 40 year pattern s change  No obvious day to day or daytime variability or assoc chronic cough or cp or chest tightness, subjective wheeze or overt sinus or hb symptoms. No h/o arthritis/ chemo/ amiodarone/ unusual exp hx or h/o childhood pna/ asthma or knowledge of premature birth.  Sleeping ok without nocturnal  or early am exacerbation  of respiratory  c/o's or need for noct saba. Also denies any obvious fluctuation of symptoms with weather or environmental changes or other aggravating or alleviating factors except as outlined above   Current Medications, Allergies, Complete Past Medical History, Past Surgical History, Family History, and Social History were reviewed in [employer] record.  ROS  The following are not active complaints unless bolded sore throat, dysphagia, dental problems, itching, sneezing,  nasal congestion or excess/ purulent secretions, ear ache,   fever, chills, sweats, unintended wt loss, classically pleuritic or exertional cp, hemoptysis,  orthopnea pnd or leg swelling, presyncope, palpitations, abdominal pain, anorexia, nausea, vomiting, diarrhea  or change in bowel or bladder habits, change in stools or urine, dysuria,hematuria,  rash, arthralgias, visual  complaints, headache, numbness, weakness or ataxia or problems with walking or coordination,  change in mood/affect or memory.            Review of Systems     Objective:   Physical Exam    amb wm nad   Wt Readings from Last 3 Encounters:  02/29/16 169 lb 12.8 oz (77 kg)  01/25/16 160 lb 8 oz (72.8 kg)  01/23/15 170 lb (77.1 kg)    Vital signs reviewed   HEENT: nl dentition, turbinates, and oropharynx. Nl external ear canals without cough reflex   NECK :  without JVD/Nodes/TM/ nl carotid upstrokes bilaterally   LUNGS: no acc muscle use,  Nl contour chest with minimal insp crackles both bases   CV:  RRR  no s3 or murmur or increase in P2, no edema   ABD:  soft and nontender with nl inspiratory excursion in the supine position. No bruits or organomegaly, bowel sounds nl  MS:  Nl gait/ ext warm without deformities, calf tenderness, cyanosis or clubbing No obvious joint restrictions   SKIN: warm and dry without lesions    NEURO:  alert, approp, nl sensorium with  no motor deficits     I personally reviewed images and agree with radiology impression as follows:  CT 02/05/16  Chest      No suspicious pulmonary nodules. Changes of pulmonary fibrosis, most pronounced in the mid and lower lung zones.  Borderline sized mediastinal lymph nodes, likely reactive.    Assessment:

## 2016-02-29 NOTE — Assessment & Plan Note (Signed)
Ct scan 01/2016:  No suspicious pulmonary nodules. Changes of pulmonary fibrosis, most pronounced in the mid and lower lung zones.  Borderline sized mediastinal lymph nodes, likely reactive.

## 2016-02-29 NOTE — Patient Instructions (Addendum)
Stay as active as you can and let me know if you are  losing ground with exercise or your oxygen saturation is dropping below your baseline   - above 90% is normal - pulse oximeter is the name of the instrument   Please schedule a follow up visit in 3 months but call sooner if needed with pfts on return

## 2016-02-29 NOTE — Assessment & Plan Note (Signed)
Ct scan 01/2016:  No suspicious pulmonary nodules. Changes of pulmonary fibrosis, most pronounced in the mid and lower lung zones.  Borderline sized mediastinal lymph nodes, likely reactive.   DDx for pulmonary fibrosis  includes idiopathic pulmonary fibrosis, pulmonary fibrosis associated with rheumatologic diseases (which have a relatively benign course in most cases) , adverse effect from  drugs such as chemotherapy or amiodarone exposure, nonspecific interstitial pneumonia which is typically steroid responsive, and chronic hypersensitivity pneumonitis.     In active  smokers Langerhan's Cell  Histiocyctosis (eosinophilic granuomatosis),  DIP,  and Respiratory Bronchiolitis ILD also need to be considered,  But no longer apply here s/p very remote smoking cessation  He most likely has very early UIP but still enjoys excellent ex tol and no cough or sob at all.  An argument could be made to go ahead with OFEV now but I would like to wait 3 months of observation and encouraged him to start monitoring 02 sats with observation to see if any trend can be established and return with full pfts at that point  Discussed in detail all the  indications, usual  risks and alternatives  relative to the benefits with patient who agrees to proceed with conservative f/u as outlined   Total time devoted to counseling  = 35/25m review case with pt/wife discussion of options/alternatives/ personally creating written instructions  in presence of pt  then going over those specific  Instructions directly with the pt including how to use all of the meds but in particular covering each new medication in detail and the difference between the maintenance/automatic meds and the prns using an action plan format for the latter.  t

## 2016-03-14 DIAGNOSIS — L821 Other seborrheic keratosis: Secondary | ICD-10-CM | POA: Diagnosis not present

## 2016-03-14 DIAGNOSIS — D1801 Hemangioma of skin and subcutaneous tissue: Secondary | ICD-10-CM | POA: Diagnosis not present

## 2016-03-14 DIAGNOSIS — L308 Other specified dermatitis: Secondary | ICD-10-CM | POA: Diagnosis not present

## 2016-05-30 ENCOUNTER — Ambulatory Visit (INDEPENDENT_AMBULATORY_CARE_PROVIDER_SITE_OTHER): Payer: Medicare Other | Admitting: Internal Medicine

## 2016-05-30 ENCOUNTER — Encounter: Payer: Self-pay | Admitting: Internal Medicine

## 2016-05-30 VITALS — BP 126/78 | HR 95 | Ht 70.5 in | Wt 168.0 lb

## 2016-05-30 DIAGNOSIS — J8410 Pulmonary fibrosis, unspecified: Secondary | ICD-10-CM | POA: Diagnosis not present

## 2016-05-30 LAB — PULMONARY FUNCTION TEST
DL/VA % PRED: 76 %
DL/VA: 3.56 ml/min/mmHg/L
DLCO COR: 19.72 ml/min/mmHg
DLCO UNC % PRED: 54 %
DLCO cor % pred: 59 %
DLCO unc: 17.96 ml/min/mmHg
FEF 25-75 PRE: 2.51 L/s
FEF 25-75 Post: 2.86 L/sec
FEF2575-%CHANGE-POST: 13 %
FEF2575-%PRED-PRE: 117 %
FEF2575-%Pred-Post: 134 %
FEV1-%Change-Post: 2 %
FEV1-%PRED-PRE: 99 %
FEV1-%Pred-Post: 101 %
FEV1-POST: 3.07 L
FEV1-PRE: 3 L
FEV1FVC-%Change-Post: 2 %
FEV1FVC-%Pred-Pre: 108 %
FEV6-%CHANGE-POST: 0 %
FEV6-%PRED-POST: 96 %
FEV6-%PRED-PRE: 96 %
FEV6-POST: 3.8 L
FEV6-PRE: 3.79 L
FEV6FVC-%Change-Post: 0 %
FEV6FVC-%PRED-POST: 106 %
FEV6FVC-%PRED-PRE: 105 %
FVC-%Change-Post: 0 %
FVC-%PRED-POST: 90 %
FVC-%Pred-Pre: 91 %
FVC-POST: 3.8 L
FVC-Pre: 3.82 L
POST FEV6/FVC RATIO: 100 %
PRE FEV1/FVC RATIO: 78 %
Post FEV1/FVC ratio: 81 %
Pre FEV6/FVC Ratio: 99 %
RV % PRED: 82 %
RV: 2.18 L
TLC % PRED: 85 %
TLC: 6.1 L

## 2016-05-30 MED ORDER — FAMOTIDINE 20 MG PO TABS: ORAL_TABLET | ORAL | 2 refills | Status: DC

## 2016-05-30 MED ORDER — PANTOPRAZOLE SODIUM 40 MG PO TBEC: 40.0000 mg | DELAYED_RELEASE_TABLET | Freq: Every day | ORAL | 2 refills | Status: DC

## 2016-05-30 NOTE — Patient Instructions (Addendum)
Pantoprazole (protonix) 40 mg   Take  30-60 min before first meal of the day and Pepcid (famotidine)  20 mg one @  bedtime until return to office - this is the best way to tell whether stomach acid is contributing to your problem.   Stop fish oil and eat more fish   GERD (REFLUX)  is an extremely common cause of respiratory symptoms just like yours , many times with no obvious heartburn at all.    It can be treated with medication, but also with lifestyle changes including elevation of the head of your bed (ideally with 6 inch  bed blocks),  Smoking cessation, avoidance of late meals, excessive alcohol, and avoid fatty foods, chocolate, peppermint, colas, red wine, and acidic juices such as orange juice.  NO MINT OR MENTHOL PRODUCTS SO NO COUGH DROPS   USE SUGARLESS CANDY INSTEAD (Jolley ranchers or Stover's or Life Savers) or even ice chips will also do - the key is to swallow to prevent all throat clearing. NO OIL BASED VITAMINS - use powdered substitutes.    Please schedule a follow up office visit in 6 months , call sooner if needed

## 2016-05-30 NOTE — Assessment & Plan Note (Signed)
Ct scan 01/2016:  No suspicious pulmonary nodules. Changes of pulmonary fibrosis, most pronounced in the mid and lower lung zones. Borderline sized mediastinal lymph nodes, likely reactive. - PFT's  05/30/2016  FEV1 3.07 (101 % ) ratio 81  p 2 % improvement from saba p nothing  prior to study with DLCO  54/59 % corrects to 76 % for alv volume    I had an extended discussion with the patient reviewing all relevant studies completed to date and  lasting 15 to 20 minutes of a 25 minute visit on the following ongoing concerns:    We really don't have  An idea at this point whether he has active ILD or prev ALI/ post inflammatory changes as his original cxr in our records 01/23/15  shows ILD and he remains to this day asymptomatic x for urge to clear throat which may be related to gerd  Use of PPI is associated with improved survival time and with decreased radiologic fibrosis per King's study published in [facility] vol 184 p1390.  Dec 2011 and also may have other beneficial effects as per the latest review in [place] vol 193 [id number] Jun 20016.  This may not always be cause and effect, but given how universally unimpressive and expensive  all the other  Drugs developed to day  have been for pf,   rec start  rx ppi / diet/ lifestyle modification and f/u with serial walking sats and lung volumes for now to put more points on the curve / establish firm baseline before considering additional measures.   F/u at 55 m for pfts/ consider hrct if ex tol or sats decline in meantime   Each maintenance medication was reviewed in detail including most importantly the difference between maintenance and as needed and under what circumstances the prns are to be used.  Please see AVS for  customized  Instructions which were reviewed in detail in writing with the patient and a copy provided.

## 2016-05-30 NOTE — Progress Notes (Signed)
Subjective:     Patient ID: Walter Hess, male   DOB: 10-03-1940     MRN: [id number]     Brief patient profile: 9 yowm never regular smoker stopped completely  in his 20's and no lower resp problems just sinus symptoms starting in 40s with recurrent infections  but no need for  specialty eval or need for rx then routine cxr suggested lung nodule in 01/2015 repeat in 01/2016 suggested PF so referred to pulmonary clinic 02/29/2016 by Dr  Quay Burow     History of Present Illness  02/29/2016 1st [facility] office visit/ Walter Hess   Chief Complaint  Patient presents with  . Pulmonary Consult    Referred by Dr. Billey Gosling for abnormal cxr. Pt denies any respiratory co's today.    6 days a week does 5 degrees of elevation at 3-4 mph x 25 min s sob x 40 year pattern s change rec Stay as active as you can and let me know if you are  losing ground with exercise or your oxygen saturation is dropping below your baseline   - above 90% is normal - pulse oximeter is the name of the instrument    05/30/2016  f/u ov/Aleira Deiter re: pf/ no change ex tol  Chief Complaint  Patient presents with  . Follow-up    Breathing is unchanged. No new co's today. PFT's done.   with peak exercise on his home treamill no limiting sob and  never less than 94%   No obvious day to day or daytime variability or assoc chronic cough or cp or chest tightness, subjective wheeze or overt sinus or hb symptoms. No h/o arthritis/ chemo/ amiodarone/ unusual exp hx or h/o childhood pna/ asthma or knowledge of premature birth.  Sleeping ok without nocturnal  or early am exacerbation  of respiratory  c/o's or need for noct saba. Also denies any obvious fluctuation of symptoms with weather or environmental changes or other aggravating or alleviating factors except as outlined above   Current Medications, Allergies, Complete Past Medical History, Past Surgical History, Family History, and Social History were reviewed in [employer] record.  ROS  The following are not active complaints unless bolded sore throat, dysphagia, dental problems, itching, sneezing,  nasal congestion or excess/ purulent secretions, ear ache,   fever, chills, sweats, unintended wt loss, classically pleuritic or exertional cp, hemoptysis,  orthopnea pnd or leg swelling, presyncope, palpitations, abdominal pain, anorexia, nausea, vomiting, diarrhea  or change in bowel or bladder habits, change in stools or urine, dysuria,hematuria,  rash, arthralgias, visual complaints, headache, numbness, weakness or ataxia or problems with walking or coordination,  change in mood/affect or memory.                Objective:   Physical Exam    amb wm nad occ throat clearing   05/30/2016        168   02/29/16 169 lb 12.8 oz (77 kg)  01/25/16 160 lb 8 oz (72.8 kg)  01/23/15 170 lb (77.1 kg)    Vital signs reviewed  - Note on arrival 02 sats  98% on RA     HEENT: nl dentition, turbinates, and oropharynx. Nl external ear canals without cough reflex   NECK :  without JVD/Nodes/TM/ nl carotid upstrokes bilaterally   LUNGS: no acc muscle use,  Nl contour chest with minimal insp crackles post 1/3 up no cough on insp   CV:  RRR  no s3 or murmur  or increase in P2, no edema   ABD:  soft and nontender with nl inspiratory excursion in the supine position. No bruits or organomegaly, bowel sounds nl  MS:  Nl gait/ ext warm without deformities, calf tenderness, cyanosis or clubbing No obvious joint restrictions   SKIN: warm and dry without lesions    NEURO:  alert, approp, nl sensorium with  no motor deficits     I personally reviewed images and agree with radiology impression as follows:  CT 02/05/16  Chest      No suspicious pulmonary nodules. Changes of pulmonary fibrosis, most pronounced in the mid and lower lung zones.  Borderline sized mediastinal lymph nodes, likely reactive.    Assessment:

## 2016-05-31 ENCOUNTER — Other Ambulatory Visit: Payer: Self-pay

## 2016-06-27 ENCOUNTER — Encounter: Payer: Self-pay | Admitting: Internal Medicine

## 2016-06-27 ENCOUNTER — Ambulatory Visit (INDEPENDENT_AMBULATORY_CARE_PROVIDER_SITE_OTHER): Payer: Medicare Other | Admitting: Internal Medicine

## 2016-06-27 ENCOUNTER — Ambulatory Visit (INDEPENDENT_AMBULATORY_CARE_PROVIDER_SITE_OTHER)
Admission: RE | Admit: 2016-06-27 | Discharge: 2016-06-27 | Disposition: A | Discharge status: Home / Self Care | Payer: Medicare Other | Source: Ambulatory Visit | Attending: Internal Medicine | Admitting: Internal Medicine

## 2016-06-27 VITALS — BP 120/68 | HR 68 | Ht 70.5 in | Wt 163.8 lb

## 2016-06-27 DIAGNOSIS — J328 Other chronic sinusitis: Secondary | ICD-10-CM

## 2016-06-27 DIAGNOSIS — J8410 Pulmonary fibrosis, unspecified: Secondary | ICD-10-CM | POA: Diagnosis not present

## 2016-06-27 DIAGNOSIS — R05 Cough: Secondary | ICD-10-CM | POA: Diagnosis not present

## 2016-06-27 DIAGNOSIS — R059 Cough, unspecified: Secondary | ICD-10-CM

## 2016-06-27 MED ORDER — AMOXICILLIN-POT CLAVULANATE 875-125 MG PO TABS: 1.0000 | ORAL_TABLET | Freq: Two times a day (BID) | ORAL | 0 refills | Status: AC

## 2016-06-27 NOTE — Progress Notes (Signed)
Subjective:     Patient ID: Walter Hess Hess, male   DOB: 07-01-1940     MRN: [id number]     Brief patient profile: 24 yowm never regular smoker stopped completely  in his 20's and no lower resp problems just sinus symptoms starting in 40s with recurrent infections  but no need for  specialty eval or need for rx then routine cxr suggested lung nodule in 01/2015 repeat in 01/2016 suggested PF so referred to pulmonary clinic 02/29/2016 by Dr  Walter Hess Hess     History of Present Illness  02/29/2016 [facility]st [facility]/ Walter Hess Hess   Chief Complaint  Patient presents with  . Pulmonary Consult    Referred by Dr. Billey Hess for abnormal cxr. Pt denies any respiratory co's today.    6 days a week does 5 degrees of elevation at 3-4 mph x 25 min s sob x 40 year pattern s change rec Stay as active as you can and let me know if you are  losing ground with exercise or your oxygen saturation is dropping below your baseline   - above 90% is normal - pulse oximeter is the name of the instrument    05/30/2016  f/u ov/Walter Hess Hess re: pf/ no change ex tol  Chief Complaint  Patient presents with  . Follow-up    Breathing is unchanged. No new co's today. PFT's done.   with peak exercise on his home treamill no limiting sob and  never less than 94% sats rec Pantoprazole (protonix) 40 mg   Take  30-60 min before first meal of the day and Pepcid (famotidine)  20 mg one @  bedtime until return to office - this is the best way to tell whether stomach acid is contributing to your problem.  Stop fish oil and eat more fish  GERD diet    06/27/2016 acute extended ov/Walter Hess Hess re: acute cough in pt with underlying PF maint rx ppi/h2hs  Chief Complaint  Patient presents with  . Acute Visit    Pt c/o cough and chest congestion- onset 5 days ago.  He has had some dark colored nasal d/c.    cc chronic nasal congestion for which he uses nettipot nightly x years but then much worse  Abruptly x one week prior to OV   then started  with cough 2 days p flare of nasal symptoms with nasal secrtions and sputum turning dark grey/green but no fever or sob     No obvious day to day or daytime variability or assoc mucus plugs/ hemoptysis cp or chest tightness, subjective wheeze or overt   hb symptoms. No h/o arthritis/ chemo/ amiodarone/ unusual exp hx or h/o childhood pna/ asthma or knowledge of premature birth.  Sleeping ok without nocturnal  or early am exacerbation  of respiratory  c/o's or need for noct saba. Also denies any obvious fluctuation of symptoms with weather or environmental changes or other aggravating or alleviating factors except as outlined above   Current Medications, Allergies, Complete Past Medical History, Past Surgical History, Family History, and Social History were reviewed in [employer] record.  ROS  The following are not active complaints unless bolded sore throat, dysphagia, dental problems, itching, sneezing,  nasal congestion or excess/ purulent secretions, ear ache,   fever, chills, sweats, unintended wt loss, classically pleuritic or exertional cp, hemoptysis,  orthopnea pnd or leg swelling, presyncope, palpitations, abdominal pain, anorexia, nausea, vomiting, diarrhea  or change in bowel or bladder habits, change in stools  or urine, dysuria,hematuria,  rash, arthralgias, visual complaints, headache, numbness, weakness or ataxia or problems with walking or coordination,  change in mood/affect or memory.            Objective:   Physical Exam    amb wm nad occ throat clearing and def  nasal tone to voice   06/27/2016          163 05/30/2016        168   02/29/16 169 lb 12.8 oz (77 kg)  01/25/16 160 lb 8 oz (72.8 kg)  01/23/15 170 lb (77.1 kg)    Vital signs reviewed  - Note on arrival 02 sats  97% on RA     HEENT: nl dentition, turbinates, and oropharynx. Nl external ear canals without cough reflex   NECK :  without JVD/Nodes/TM/ nl carotid upstrokes  bilaterally   LUNGS: no acc muscle use,  Nl contour chest with very minimal insp crackles post 1/3 up no cough on insp   CV:  RRR  no s3 or murmur or increase in P2, no edema   ABD:  soft and nontender with nl inspiratory excursion in the supine position. No bruits or organomegaly, bowel sounds nl  MS:  Nl gait/ ext warm without deformities, calf tenderness, cyanosis or clubbing No obvious joint restrictions   SKIN: warm and dry without lesions    NEURO:  alert, approp, nl sensorium with  no motor deficits      CXR PA and Lateral:   06/27/2016 :    I personally reviewed images and agree with radiology impression as follows:    Stable chronic changes of pulmonary fibrosis. No definite active process.      Assessment:

## 2016-06-27 NOTE — Assessment & Plan Note (Signed)
Ct scan 01/2016:  No suspicious pulmonary nodules. Changes of pulmonary fibrosis, most pronounced in the mid and lower lung zones. Borderline sized mediastinal lymph nodes, likely reactive. - PFT's  05/30/2016  FEV1 3.07 (101 % ) ratio 81  p 2 % improvement from saba p nothing  prior to study with DLCO  54/59 % corrects to 76 % for alv volume    No evidence of PF flare/ no need for steroid rx even in short term in this setting > f/u with pfts planned

## 2016-06-27 NOTE — Progress Notes (Signed)
Spoke with pt and notified of results per Dr. Wert. Pt verbalized understanding and denied any questions. 

## 2016-06-27 NOTE — Patient Instructions (Signed)
For cough > mucinex dm up to 1200 mg every 12 hours as needed   Augmentin 875 mg take one pill twice daily  X 10 days - take at breakfast and supper with large glass of water.  It would help reduce the usual side effects (diarrhea and yeast infections) if you ate cultured yogurt at lunch.   Please remember to go to the  x-ray department downstairs for your tests - we will call you with the results when they are available.     Call in 2 weeks if not better, otherwise we'll see you in June 2018 as planned

## 2016-06-27 NOTE — Assessment & Plan Note (Signed)
Acute onset in setting of uri/ ? Flare of chronic rhinitis / sinusitis but no evidence of PF flare   rx symptomatically with mucinex dm  Of the three most common causes of   cough, only one (GERD)  can actually cause the other two (asthma and post nasal drip syndrome)  and perpetuate the cylce of cough inducing airway trauma, inflammation, heightened sensitivity to reflux which is prompted by the cough itself via a cyclical mechanism.    This may partially respond to steroids and look like asthma and post nasal drainage but never erradicated completely unless the cough and the secondary reflux are eliminated, preferably both at the same time.  While not intuitively obvious, many patients with chronic low grade reflux do not cough until there is a secondary insult that disturbs the protective epithelial barrier and exposes sensitive nerve endings.  This can be viral as may have originally been the case here  or direct physical injury such as with an endotracheal tube.   The point is that once this occurs, it is difficult to eliminate using anything but a maximally effective acid suppression regimen at least in the short run, accompanied by an appropriate diet to address non acid GERD.   Reviewed diet/ gerd rx    I had an extended discussion with the patient reviewing all relevant studies completed to date and  lasting 15 to 20 minutes of a 25 minute acute office  visit    Each maintenance medication was reviewed in detail including most importantly the difference between maintenance and prns and under what circumstances the prns are to be triggered using an action plan format that is not reflected in the computer generated alphabetically organized AVS.    Please see AVS for specific instructions unique to this visit that I personally wrote and verbalized to the the pt in detail and then reviewed with pt  by my nurse highlighting any  changes in therapy recommended at today's visit to their plan of  care.

## 2016-06-27 NOTE — Assessment & Plan Note (Signed)
MRI [facility] 07/21/2015 . Obstructive pattern left maxillary and ethmoid sinusitis, may be chronic. - 06/27/2016  rx augmentin bid x 10days then sinus ct if not better   Also rec continue nettipot

## 2016-07-19 ENCOUNTER — Other Ambulatory Visit: Payer: Self-pay | Admitting: Internal Medicine

## 2016-07-19 DIAGNOSIS — E782 Mixed hyperlipidemia: Secondary | ICD-10-CM

## 2016-09-17 DIAGNOSIS — D1801 Hemangioma of skin and subcutaneous tissue: Secondary | ICD-10-CM | POA: Diagnosis not present

## 2016-09-17 DIAGNOSIS — L570 Actinic keratosis: Secondary | ICD-10-CM | POA: Diagnosis not present

## 2016-09-17 DIAGNOSIS — L814 Other melanin hyperpigmentation: Secondary | ICD-10-CM | POA: Diagnosis not present

## 2016-09-17 DIAGNOSIS — L400 Psoriasis vulgaris: Secondary | ICD-10-CM | POA: Diagnosis not present

## 2016-09-17 DIAGNOSIS — L821 Other seborrheic keratosis: Secondary | ICD-10-CM | POA: Diagnosis not present

## 2016-09-17 DIAGNOSIS — D485 Neoplasm of uncertain behavior of skin: Secondary | ICD-10-CM | POA: Diagnosis not present

## 2016-11-13 DIAGNOSIS — H26491 Other secondary cataract, right eye: Secondary | ICD-10-CM | POA: Diagnosis not present

## 2016-11-13 DIAGNOSIS — Z961 Presence of intraocular lens: Secondary | ICD-10-CM | POA: Diagnosis not present

## 2016-11-13 DIAGNOSIS — H52223 Regular astigmatism, bilateral: Secondary | ICD-10-CM | POA: Diagnosis not present

## 2016-11-13 DIAGNOSIS — H04129 Dry eye syndrome of unspecified lacrimal gland: Secondary | ICD-10-CM | POA: Diagnosis not present

## 2016-11-13 DIAGNOSIS — H43393 Other vitreous opacities, bilateral: Secondary | ICD-10-CM | POA: Diagnosis not present

## 2016-11-13 DIAGNOSIS — H5203 Hypermetropia, bilateral: Secondary | ICD-10-CM | POA: Diagnosis not present

## 2016-11-25 ENCOUNTER — Encounter: Payer: Self-pay | Admitting: Internal Medicine

## 2016-11-25 ENCOUNTER — Ambulatory Visit (INDEPENDENT_AMBULATORY_CARE_PROVIDER_SITE_OTHER): Payer: Medicare Other | Admitting: Internal Medicine

## 2016-11-25 VITALS — BP 130/76 | HR 107 | Ht 71.0 in | Wt 167.0 lb

## 2016-11-25 DIAGNOSIS — J8410 Pulmonary fibrosis, unspecified: Secondary | ICD-10-CM | POA: Diagnosis not present

## 2016-11-25 MED ORDER — FAMOTIDINE 20 MG PO TABS: ORAL_TABLET | ORAL | 2 refills | Status: DC

## 2016-11-25 MED ORDER — PANTOPRAZOLE SODIUM 40 MG PO TBEC: 40.0000 mg | DELAYED_RELEASE_TABLET | Freq: Every day | ORAL | 2 refills | Status: DC

## 2016-11-25 NOTE — Progress Notes (Signed)
Subjective:     Patient ID: Walter Hess, male   DOB: 04/13/1941     MRN: [id number]     Brief patient profile: 35 yowm never regular smoker stopped completely  in his 20's and no lower resp problems just sinus symptoms starting in 40s with recurrent infections  but no need for  specialty eval or need for rx then routine cxr suggested lung nodule in 01/2015 repeat in 01/2016 suggested PF so referred to pulmonary clinic 02/29/2016 by Dr  Quay Burow     History of Present Illness  02/29/2016 1st [facility] office visit/ [name]   Chief Complaint  Patient presents with  . Pulmonary Consult    Referred by Dr. Billey Gosling for abnormal cxr. Pt denies any respiratory co's today.    6 days a week does 5 degrees of elevation at 3-4 mph x 25 min s sob x 40 year pattern s change rec Stay as active as you can and let me know if you are  losing ground with exercise or your oxygen saturation is dropping below your baseline   - above 90% is normal - pulse oximeter is the name of the instrument    05/30/2016  f/u ov/Jeven Topper re: pf/ no change ex tol  Chief Complaint  Patient presents with  . Follow-up    Breathing is unchanged. No new co's today. PFT's done.   with peak exercise on his home treamill no limiting sob and  never less than 94% sats rec Pantoprazole (protonix) 40 mg   Take  30-60 min before first meal of the day and Pepcid (famotidine)  20 mg one @  bedtime until return to office - this is the best way to tell whether stomach acid is contributing to your problem.  Stop fish oil and eat more fish  GERD diet    06/27/2016 acute extended ov/[name] re: acute cough in pt with underlying PF maint rx ppi/h2hs  Chief Complaint  Patient presents with  . Acute Visit    Pt c/o cough and chest congestion- onset 5 days ago.  He has had some dark colored nasal d/c.    cc chronic nasal congestion for which he uses nettipot nightly x years but then much worse  Abruptly x one week prior to OV   then started  with cough 2 days p flare of nasal symptoms with nasal secrtions and sputum turning dark grey/green but no fever or sob   rec For cough > mucinex dm up to 1200 mg every 12 hours as needed  Augmentin 875 mg take one pill twice daily  X 10 days - take at breakfast and supper with large glass of water.  It would help reduce the usual side effects (diarrhea and yeast infections) if you ate cultured yogurt at lunch.  Please remember to go to the  x-ray department downstairs for your tests - we will call you with the results when they are available.   11/25/2016  f/u ov/[name] re:  PF / cough resolved on gerd rx  Chief Complaint  Patient presents with  . Follow-up    Cough has resolved. No new co's.    6 days per week, treadmill at 5 degrees x 4.5 mph x 25 min with sats 92% and no change ex tol or sats  No cough at all   No obvious day to day or daytime variability or assoc excess/ purulent sputum or mucus plugs or hemoptysis or cp or chest tightness, subjective wheeze  or overt sinus or hb symptoms. No unusual exp hx or h/o childhood pna/ asthma or knowledge of premature birth.  Sleeping ok without nocturnal  or early am exacerbation  of respiratory  c/o's or need for noct saba. Also denies any obvious fluctuation of symptoms with weather or environmental changes or other aggravating or alleviating factors except as outlined above   Current Medications, Allergies, Complete Past Medical History, Past Surgical History, Family History, and Social History were reviewed in [employer] record.  ROS  The following are not active complaints unless bolded sore throat, dysphagia, dental problems, itching, sneezing,  nasal congestion or excess/ purulent secretions, ear ache,   fever, chills, sweats, unintended wt loss, classically pleuritic or exertional cp,  orthopnea pnd or leg swelling, presyncope, palpitations, abdominal pain, anorexia, nausea, vomiting, diarrhea  or change in bowel or  bladder habits, change in stools or urine, dysuria,hematuria,  rash, arthralgias, visual complaints, headache, numbness, weakness or ataxia or problems with walking or coordination,  change in mood/affect or memory.               Objective:   Physical Exam    amb wm nad    11/25/2016          167  06/27/2016          163 05/30/2016        168   02/29/16 169 lb 12.8 oz (77 kg)  01/25/16 160 lb 8 oz (72.8 kg)  01/23/15 170 lb (77.1 kg)    Vital signs reviewed  - Note on arrival 02 sats  100% on RA     HEENT: nl dentition, turbinates, and oropharynx. Nl external ear canals without cough reflex   NECK :  without JVD/Nodes/TM/ nl carotid upstrokes bilaterally   LUNGS: no acc muscle use,  Nl contour chest with  Minimal insp crackles both bases s cough   CV:  RRR  no s3 or murmur or increase in P2, no edema   ABD:  soft and nontender with nl inspiratory excursion in the supine position. No bruits or organomegaly, bowel sounds nl  MS:  Nl gait/ ext warm without deformities, calf tenderness, cyanosis and no clubbing at all  No obvious joint restrictions   SKIN: warm and dry without lesions    NEURO:  alert, approp, nl sensorium with  no motor deficits            Assessment:

## 2016-11-25 NOTE — Assessment & Plan Note (Signed)
Ct scan 01/2016:  No suspicious pulmonary nodules. Changes of pulmonary fibrosis, most pronounced in the mid and lower lung zones. Borderline sized mediastinal lymph nodes, likely reactive. - PFT's  05/30/2016  FEV1 3.07 (101 % ) ratio 81  p 2 % improvement from saba p nothing  prior to study with DLCO  54/59 % corrects to 76 % for alv volume   - HRCT 02/04/17 >>>   No evidence at all of dz progression with well preserved ex tol though the sats at the end of ex are a bit low, they are no change from baseline and the cough is gone on gerd rx  Discussed in detail all the  indications, usual  risks and alternatives  relative to the benefits with patient who agrees to proceed with conservative f/u on max gerd rx with HRCT Chest at the 1 year anniversary of last study and repeat pfts 05/2017 unless losing ground in meantime with reg ex   I had an extended discussion with the patient reviewing all relevant studies completed to date and  lasting 15 to 20 minutes of a 25 minute visit    Each maintenance medication was reviewed in detail including most importantly the difference between maintenance and prns and under what circumstances the prns are to be triggered using an action plan format that is not reflected in the computer generated alphabetically organized AVS.    Please see AVS for specific instructions unique to this visit that I personally wrote and verbalized to the the pt in detail and then reviewed with pt  by my nurse highlighting any  changes in therapy recommended at today's visit to their plan of care.

## 2016-11-25 NOTE — Patient Instructions (Addendum)
No change in acid suppression   Please see patient coordinator before you leave today  to schedule HRCT in 2 months   Please schedule a follow up visit in 6 months but call sooner if needed - sooner if losing ground or 02 sats dropping more than now

## 2017-01-16 DIAGNOSIS — R3911 Hesitancy of micturition: Secondary | ICD-10-CM | POA: Diagnosis not present

## 2017-01-16 DIAGNOSIS — N401 Enlarged prostate with lower urinary tract symptoms: Secondary | ICD-10-CM | POA: Diagnosis not present

## 2017-01-24 ENCOUNTER — Ambulatory Visit (INDEPENDENT_AMBULATORY_CARE_PROVIDER_SITE_OTHER)
Admission: RE | Admit: 2017-01-24 | Discharge: 2017-01-24 | Disposition: A | Discharge status: Home / Self Care | Payer: Medicare Other | Source: Ambulatory Visit | Attending: Internal Medicine | Admitting: Internal Medicine

## 2017-01-24 DIAGNOSIS — J849 Interstitial pulmonary disease, unspecified: Secondary | ICD-10-CM | POA: Diagnosis not present

## 2017-01-24 DIAGNOSIS — J8410 Pulmonary fibrosis, unspecified: Secondary | ICD-10-CM

## 2017-01-26 DIAGNOSIS — K219 Gastro-esophageal reflux disease without esophagitis: Secondary | ICD-10-CM | POA: Insufficient documentation

## 2017-01-26 NOTE — Assessment & Plan Note (Signed)
Check a1c Low sugar / carb diet Stressed regular exercise   

## 2017-01-26 NOTE — Assessment & Plan Note (Signed)
Check lipid panel,cmp ,tsh Continue daily statin Regular exercise and healthy diet encouraged  

## 2017-01-26 NOTE — Assessment & Plan Note (Addendum)
minimal Cmp, cbc

## 2017-01-26 NOTE — Progress Notes (Signed)
Subjective:    Patient ID: Walter Hess, male    DOB: [birth year], 76 y.o.   MRN: [id number]  HPI  Here for medicare wellness exam.   Change to crestor  I have personally reviewed and have noted 1.The patient's medical and social history 2.Their use of alcohol, tobacco or illicit drugs 3.Their current medications and supplements 4.The patient's functional ability including ADL's, fall risks, home safety risks and hearing or visual impairment. 5.Diet and physical activities 6.Evidence for depression or mood disorders 7.Care team reviewed  - urology - Dr Amalia Hailey, Derm - Dr Wilhemina Bonito, pulmonary - Dr Melvyn Novas   Hyperlipidemia: He is taking his medication daily. He had a CT scan of his chest recently that showed aortic atherosclerosis and three vessel coronary artery disease.  He is compliant with a low fat/cholesterol diet. He is exercising regularly. He denies myalgias.   Prediabetes:  He is compliant with a low sugar/carbohydrate diet.  He is exercising regularly.  GERD:  He is taking his medication daily as prescribed.  He was started on the medication by pulmonary - he never felt GERD, but there was concern about silent GERD.  He denies any GERD symptoms.    Are there smokers in your home (other than you)? No  Risk Factors Exercise:  Walks on treadmill 6/week for 25 minutes, sit ups, stretching, weights three times a week Dietary issues discussed: eats minimal meat, probably too many sweets, good amount of fruits and veges  Cardiac risk factors: advanced age, hyperlipidemia  Depression Screen  Have you felt down, depressed or hopeless? No  Have you felt little interest or pleasure in doing things?  No  Activities of Daily Living In your present state of health, do you have any difficulty performing the following activities?:  Driving? No Managing money?  No Feeding yourself? No Getting from bed to chair?  No Climbing a flight of stairs? No Preparing food and eating?: No Bathing or showering? No Getting dressed: No Getting to/using the toilet? No Moving around from place to place: No In the past year have you fallen or had a near fall?: No   Are you sexually active?  yes  Do you have more than one partner?  no  Hearing Difficulties: yes, in left ear, ? right - has gotten worse Do you often ask people to speak up or repeat themselves? yes Do you experience ringing or noises in your ears? No Do you have difficulty understanding soft or whispered voices? Yes  Vision:              Any change in vision:  no             Up to date with eye exam:   yes Memory:  Do you feel that you have a problem with memory? No, except name recall  Do you often misplace items? No  Do you feel safe at home?  Yes  Cognitive Testing  Alert, Orientated? Yes  Normal Appearance? Yes  Recall of three objects?  Yes  Can perform simple calculations? Yes  Displays appropriate judgment? Yes  Can read the correct time from a watch face? Yes   Advanced Directives have been discussed with the patient? Yes -  In place   Medications and allergies reviewed with patient and updated if appropriate.  Patient Active Problem List   Diagnosis Date Noted  . Decreased hearing 01/27/2017  . GERD (gastroesophageal reflux disease) 01/26/2017  . Cough 06/27/2016  . Sinusitis,  chronic 02/29/2016  . Postinflammatory pulmonary fibrosis ([place]) 02/29/2016  . Chronic kidney disease 01/30/2016  . Lung nodule 01/28/2016  . Prediabetes 01/25/2016  . Vertebral basilar insufficiency 01/25/2016  . Abnormal chest x-ray 01/25/2016  . DUPUYTREN'S CONTRACTURE, RIGHT 10/05/2009  . History of anemia 09/22/2008  . HYPERLIPIDEMIA 06/24/2007  . DIVERTICULOSIS, COLON 06/24/2007  . PROSTATE SPECIFIC ANTIGEN( PSA), ELEVATED 06/24/2007  . NONSPECIFIC ABNORMAL ELECTROCARDIOGRAM 06/24/2007    Current Outpatient Prescriptions on File Prior  to Visit  Medication Sig Dispense Refill  . aspirin 81 MG tablet Take 81 mg by mouth daily.      . Coenzyme Q10 (COQ10) 100 MG CAPS Take by mouth daily.    . famotidine (PEPCID) 20 MG tablet One at bedtime 90 tablet 2  . Multiple Vitamin (MULTIVITAMIN) tablet Take 1 tablet by mouth daily.      . pantoprazole (PROTONIX) 40 MG tablet Take 1 tablet (40 mg total) by mouth daily. Take 30-60 min before first meal of the day 90 tablet 2  . pravastatin (PRAVACHOL) 40 MG tablet TAKE 1 TABLET AT BEDTIME AS DIRECTED 90 tablet 1  . S-Adenosylmethionine (SAM-E) 200 MG TABS Take by mouth daily.      . Saw Palmetto 450 MG CAPS Take by mouth daily.      . Turmeric 500 MG CAPS Take by mouth daily.       No current facility-administered medications on file prior to visit.     Past Medical History:  Diagnosis Date  . Anemia    former blood donor for decades; stopped 2006  . Diverticulosis 2005   Dr. Olevia Perches  . Elevated prostate specific antigen (PSA)    Dr. Amalia Hailey [facility]  . Gilbert's syndrome   . Hyperlipidemia     Past Surgical History:  Procedure Laterality Date  . CATARACT EXTRACTION, BILATERAL  2017  . COLONOSCOPY  2005   Diverticulosis, Dr.Brodie.  Marland Kitchen GANGLION CYST EXCISION  2005   5th L digit  . LUMBAR LAMINECTOMY  1981    Social History   Social History  . Marital status: Married    Spouse name: N/A  . Number of children: N/A  . Years of education: N/A   Social History Main Topics  . Smoking status: Former Smoker    Packs/day: 0.50    Years: 5.00    Quit date: 06/25/1963  . Smokeless tobacco: Never Used  . Alcohol use 0.0 oz/week     Comment: beer occasionally  . Drug use: No  . Sexual activity: Not Asked   Other Topics Concern  . None   Social History Narrative  . None    Family History  Problem Relation Age of Onset  . Heart attack Father 32       former smoker  . Alzheimer's disease Mother   . Coronary artery disease Sister        open heart surgery  . Diabetes  Paternal Aunt   . Heart attack Paternal Uncle 68  . Coronary artery disease Paternal Grandfather   . Prostate cancer Paternal Grandfather   . Prostate cancer Maternal Uncle   . Stroke Neg Hx   . Colon cancer Neg Hx     Review of Systems  Constitutional: Negative for appetite change, chills, fatigue and fever.  HENT: Positive for hearing loss and postnasal drip. Negative for tinnitus.   Eyes: Negative for visual disturbance.  Respiratory: Positive for cough (mild, dry). Negative for shortness of breath and wheezing.   Cardiovascular: Negative for  chest pain, palpitations and leg swelling.  Gastrointestinal: Negative for abdominal pain, blood in stool, constipation, diarrhea and nausea.       No gerd  Genitourinary: Negative for dysuria and hematuria.  Musculoskeletal: Negative for arthralgias, back pain and myalgias.  Skin: Negative for color change and rash.  Neurological: Negative for light-headedness and headaches.  Psychiatric/Behavioral: Negative for dysphoric mood. The patient is not nervous/anxious.        Objective:   Vitals:   01/27/17 0805  BP: 138/82  Pulse: 68  Resp: 16  Temp: 98.8 F (37.1 C)   Filed Weights   01/27/17 0805  Weight: 168 lb (76.2 kg)   Body mass index is 23.43 kg/m.  Wt Readings from Last 3 Encounters:  01/27/17 168 lb (76.2 kg)  11/25/16 167 lb (75.8 kg)  06/27/16 163 lb 12.8 oz (74.3 kg)     Physical Exam Constitutional: He appears well-developed and well-nourished. No distress.  HENT:  Head: Normocephalic and atraumatic.  Right Ear: External ear normal.  Left Ear: External ear normal.  Mouth/Throat: Oropharynx is clear and moist.  Normal ear canals and TM b/l  Eyes: Conjunctivae and EOM are normal.  Neck: Neck supple. No tracheal deviation present. No thyromegaly present.  No carotid bruit  Cardiovascular: Normal rate, regular rhythm, normal heart sounds and intact distal pulses.   No murmur heard. Pulmonary/Chest: Effort  normal and breath sounds normal. No respiratory distress. He has no wheezes. He has no rales.  Abdominal: Soft. Bowel sounds are normal. He exhibits no distension. There is no tenderness.  Genitourinary: deferred to urology Musculoskeletal: He exhibits no edema.  Lymphadenopathy:   He has no cervical adenopathy.  Skin: Skin is warm and dry. He is not diaphoretic.  Psychiatric: He has a normal mood and affect. His behavior is normal.         Assessment & Plan:   Wellness Exam: Immunizations  Up to date, discussed shingrix ( zostavax 2013) Colonoscopy - no longer needed due to age Eye exam  Up to date Hearing loss    Yes, getting worse - interested in evaluation - will refer to ENT Memory concerns/difficulties   none Independent of ADLs    fully Stressed the importance of regular exercise   Patient received copy of preventative screening tests/immunizations recommended for the next 5-10 years.   See Problem List for Assessment and Plan of chronic medical problems.  FU annually

## 2017-01-26 NOTE — Patient Instructions (Addendum)
  [name] , Thank you for taking time to come for your Medicare Wellness Visit. I appreciate your ongoing commitment to your health goals. Please review the following plan we discussed and let me know if I can assist you in the future.   These are the goals we discussed: Goals    None      This is a list of the screening recommended for you and due dates:  Health Maintenance  Topic Date Due  . Flu Shot  01/22/2017  . Tetanus Vaccine  07/01/2024  . Pneumonia vaccines  Completed    Get the new shingles vaccine.    Test(s) ordered today. Your results will be released to [facility] (or called to you) after review, usually within 72hours after test completion. If any changes need to be made, you will be notified at that same time.  All other Health Maintenance issues reviewed.   All recommended immunizations and age-appropriate screenings are up-to-date or discussed.  No immunizations administered today.   Medications reviewed and updated.  Changes include stopping the pravastatin and starting crestor.   Your prescription(s) have been submitted to your pharmacy. Please take as directed and contact our office if you believe you are having problem(s) with the medication(s).  A referral was ordered for ENT  Please followup in 12 months

## 2017-01-27 ENCOUNTER — Other Ambulatory Visit (INDEPENDENT_AMBULATORY_CARE_PROVIDER_SITE_OTHER): Payer: Medicare Other

## 2017-01-27 ENCOUNTER — Encounter: Payer: Self-pay | Admitting: Internal Medicine

## 2017-01-27 ENCOUNTER — Ambulatory Visit (INDEPENDENT_AMBULATORY_CARE_PROVIDER_SITE_OTHER): Payer: Medicare Other | Admitting: Internal Medicine

## 2017-01-27 VITALS — BP 138/82 | HR 68 | Temp 98.8°F | Resp 16 | Ht 71.0 in | Wt 168.0 lb

## 2017-01-27 DIAGNOSIS — Z0000 Encounter for general adult medical examination without abnormal findings: Secondary | ICD-10-CM | POA: Diagnosis not present

## 2017-01-27 DIAGNOSIS — N189 Chronic kidney disease, unspecified: Secondary | ICD-10-CM

## 2017-01-27 DIAGNOSIS — J328 Other chronic sinusitis: Secondary | ICD-10-CM | POA: Diagnosis not present

## 2017-01-27 DIAGNOSIS — R972 Elevated prostate specific antigen [PSA]: Secondary | ICD-10-CM

## 2017-01-27 DIAGNOSIS — K219 Gastro-esophageal reflux disease without esophagitis: Secondary | ICD-10-CM

## 2017-01-27 DIAGNOSIS — H9190 Unspecified hearing loss, unspecified ear: Secondary | ICD-10-CM

## 2017-01-27 DIAGNOSIS — R7303 Prediabetes: Secondary | ICD-10-CM

## 2017-01-27 DIAGNOSIS — E782 Mixed hyperlipidemia: Secondary | ICD-10-CM

## 2017-01-27 LAB — COMPREHENSIVE METABOLIC PANEL
ALT: 16 U/L (ref 0–53)
AST: 21 U/L (ref 0–37)
Albumin: 4.2 g/dL (ref 3.5–5.2)
Alkaline Phosphatase: 35 U/L — ABNORMAL LOW (ref 39–117)
BUN: 16 mg/dL (ref 6–23)
CALCIUM: 9.2 mg/dL (ref 8.4–10.5)
CO2: 26 meq/L (ref 19–32)
Chloride: 107 mEq/L (ref 96–112)
Creatinine, Ser: 1.25 mg/dL (ref 0.40–1.50)
GFR: 59.28 mL/min — AB (ref >60.00)
GLUCOSE: 131 mg/dL — AB (ref 70–99)
POTASSIUM: 4.2 meq/L (ref 3.5–5.1)
Sodium: 141 mEq/L (ref 135–145)
Total Bilirubin: 0.9 mg/dL (ref 0.2–1.2)
Total Protein: 7.1 g/dL (ref 6.0–8.3)

## 2017-01-27 LAB — CBC WITH DIFFERENTIAL/PLATELET
BASOS ABS: 0.1 10*3/uL (ref 0.0–0.1)
Basophils Relative: 1 % (ref 0.0–3.0)
EOS PCT: 4.6 % (ref 0.0–5.0)
Eosinophils Absolute: 0.3 10*3/uL (ref 0.0–0.7)
HCT: 43.9 % (ref 39.0–52.0)
Hemoglobin: 15.1 g/dL (ref 13.0–17.0)
LYMPHS PCT: 15.8 % (ref 12.0–46.0)
Lymphs Abs: 1.2 10*3/uL (ref 0.7–4.0)
MCHC: 34.4 g/dL (ref 30.0–36.0)
MCV: 97.9 fl (ref 78.0–100.0)
MONOS PCT: 9.8 % (ref 3.0–12.0)
Monocytes Absolute: 0.8 10*3/uL (ref 0.1–1.0)
NEUTROS ABS: 5.3 10*3/uL (ref 1.4–7.7)
NEUTROS PCT: 68.8 % (ref 43.0–77.0)
PLATELETS: 213 10*3/uL (ref 150.0–400.0)
RBC: 4.48 Mil/uL (ref 4.22–5.81)
RDW: 13.1 % (ref 11.5–15.5)
WBC: 7.7 10*3/uL (ref 4.0–10.5)

## 2017-01-27 LAB — HEMOGLOBIN A1C: HEMOGLOBIN A1C: 6.1 % (ref 4.6–6.5)

## 2017-01-27 LAB — LIPID PANEL
CHOL/HDL RATIO: 2
Cholesterol: 110 mg/dL (ref 0–200)
HDL: 49.4 mg/dL (ref >39.00)
LDL CALC: 50 mg/dL (ref 0–99)
NONHDL: 60.69
Triglycerides: 51 mg/dL (ref 0.0–149.0)
VLDL: 10.2 mg/dL (ref 0.0–40.0)

## 2017-01-27 LAB — TSH: TSH: 4.2 u[IU]/mL (ref 0.35–4.50)

## 2017-01-27 MED ORDER — ZOSTER VAC RECOMB ADJUVANTED 50 MCG/0.5ML IM SUSR
0.5000 mL | Freq: Once | INTRAMUSCULAR | 1 refills | Status: AC
Start: 2017-01-27 — End: 2017-01-27

## 2017-01-27 MED ORDER — ROSUVASTATIN CALCIUM 10 MG PO TABS: 10.0000 mg | ORAL_TABLET | Freq: Every day | ORAL | 3 refills | Status: DC

## 2017-01-27 NOTE — Assessment & Plan Note (Signed)
Chronic  Has mild chronic symptoms Will refer to ENT

## 2017-01-27 NOTE — Progress Notes (Signed)
Spoke with pt and notified of results per Dr. Wert. Pt verbalized understanding and denied any questions. 

## 2017-01-27 NOTE — Assessment & Plan Note (Signed)
Refer to ENT since he has chronic sinusitis as well

## 2017-01-27 NOTE — Assessment & Plan Note (Signed)
Following with urology

## 2017-01-27 NOTE — Assessment & Plan Note (Signed)
Silent GERD Started on PPI, pepcid by Pulm - will continue

## 2017-01-28 ENCOUNTER — Encounter: Payer: Self-pay | Admitting: Internal Medicine

## 2017-03-05 DIAGNOSIS — J342 Deviated nasal septum: Secondary | ICD-10-CM | POA: Diagnosis not present

## 2017-03-05 DIAGNOSIS — H903 Sensorineural hearing loss, bilateral: Secondary | ICD-10-CM | POA: Insufficient documentation

## 2017-03-05 DIAGNOSIS — J324 Chronic pansinusitis: Secondary | ICD-10-CM | POA: Diagnosis not present

## 2017-03-05 DIAGNOSIS — J3089 Other allergic rhinitis: Secondary | ICD-10-CM | POA: Insufficient documentation

## 2017-03-05 DIAGNOSIS — R05 Cough: Secondary | ICD-10-CM | POA: Diagnosis not present

## 2017-03-14 DIAGNOSIS — Z23 Encounter for immunization: Secondary | ICD-10-CM | POA: Diagnosis not present

## 2017-03-19 DIAGNOSIS — J324 Chronic pansinusitis: Secondary | ICD-10-CM | POA: Diagnosis not present

## 2017-03-20 DIAGNOSIS — L570 Actinic keratosis: Secondary | ICD-10-CM | POA: Diagnosis not present

## 2017-03-20 DIAGNOSIS — L821 Other seborrheic keratosis: Secondary | ICD-10-CM | POA: Diagnosis not present

## 2017-03-20 DIAGNOSIS — D1801 Hemangioma of skin and subcutaneous tissue: Secondary | ICD-10-CM | POA: Diagnosis not present

## 2017-03-20 DIAGNOSIS — L400 Psoriasis vulgaris: Secondary | ICD-10-CM | POA: Diagnosis not present

## 2017-03-20 DIAGNOSIS — D692 Other nonthrombocytopenic purpura: Secondary | ICD-10-CM | POA: Diagnosis not present

## 2017-03-28 DIAGNOSIS — J329 Chronic sinusitis, unspecified: Secondary | ICD-10-CM | POA: Diagnosis not present

## 2017-03-28 DIAGNOSIS — J320 Chronic maxillary sinusitis: Secondary | ICD-10-CM | POA: Diagnosis not present

## 2017-04-01 ENCOUNTER — Other Ambulatory Visit (INDEPENDENT_AMBULATORY_CARE_PROVIDER_SITE_OTHER): Payer: Medicare Other

## 2017-04-01 DIAGNOSIS — E782 Mixed hyperlipidemia: Secondary | ICD-10-CM

## 2017-04-01 LAB — LIPID PANEL
CHOLESTEROL: 83 mg/dL (ref 0–200)
HDL: 42.4 mg/dL (ref >39.00)
LDL Cholesterol: 25 mg/dL (ref 0–99)
NONHDL: 40.31
Total CHOL/HDL Ratio: 2
Triglycerides: 76 mg/dL (ref 0.0–149.0)
VLDL: 15.2 mg/dL (ref 0.0–40.0)

## 2017-04-01 LAB — HEPATIC FUNCTION PANEL
ALBUMIN: 4 g/dL (ref 3.5–5.2)
ALK PHOS: 33 U/L — AB (ref 39–117)
ALT: 17 U/L (ref 0–53)
AST: 23 U/L (ref 0–37)
Bilirubin, Direct: 0.2 mg/dL (ref 0.0–0.3)
TOTAL PROTEIN: 6.6 g/dL (ref 6.0–8.3)
Total Bilirubin: 1.1 mg/dL (ref 0.2–1.2)

## 2017-04-02 ENCOUNTER — Encounter: Payer: Self-pay | Admitting: Internal Medicine

## 2017-04-03 DIAGNOSIS — J324 Chronic pansinusitis: Secondary | ICD-10-CM | POA: Diagnosis not present

## 2017-04-29 DIAGNOSIS — H903 Sensorineural hearing loss, bilateral: Secondary | ICD-10-CM | POA: Diagnosis not present

## 2017-05-27 ENCOUNTER — Ambulatory Visit (INDEPENDENT_AMBULATORY_CARE_PROVIDER_SITE_OTHER): Payer: Medicare Other | Admitting: Internal Medicine

## 2017-05-27 ENCOUNTER — Encounter: Payer: Self-pay | Admitting: Internal Medicine

## 2017-05-27 VITALS — BP 128/70 | HR 60 | Ht 71.0 in | Wt 166.0 lb

## 2017-05-27 DIAGNOSIS — J8410 Pulmonary fibrosis, unspecified: Secondary | ICD-10-CM

## 2017-05-27 NOTE — Patient Instructions (Addendum)
Please schedule a follow up visit in  6 months but call sooner if needed with pfts on return 

## 2017-05-27 NOTE — Progress Notes (Signed)
Subjective:     Patient ID: Walter Hess, male   DOB: 09/19/1940     MRN: [id number]    Brief patient profile:  21 yowm never regular smoker stopped completely  in his 20's and no lower resp problems just sinus symptoms starting in 40s with recurrent infections  but no need for  specialty eval or need for rx then routine cxr suggested lung nodule in 01/2015 repeat in 01/2016 suggested PF so referred to pulmonary clinic 02/29/2016 by Dr  Quay Burow     History of Present Illness  02/29/2016 1st [facility] office visit/ Herson Prichard   Chief Complaint  Patient presents with  . Pulmonary Consult    Referred by Dr. Billey Gosling for abnormal cxr. Pt denies any respiratory co's today.    6 days a week does 5 degrees of elevation at 3-4 mph x 25 min s sob x 40 year pattern s change rec Stay as active as you can and let me know if you are  losing ground with exercise or your oxygen saturation is dropping below your baseline   - above 90% is normal - pulse oximeter is the name of the instrument    05/30/2016  f/u ov/Emmersen Garraway re: pf/ no change ex tol  Chief Complaint  Patient presents with  . Follow-up    Breathing is unchanged. No new co's today. PFT's done.   with peak exercise on his home treamill no limiting sob and  never less than 94% sats rec Pantoprazole (protonix) 40 mg   Take  30-60 min before first meal of the day and Pepcid (famotidine)  20 mg one @  bedtime until return to office - this is the best way to tell whether stomach acid is contributing to your problem.  Stop fish oil and eat more fish  GERD diet    06/27/2016 acute extended ov/Ilana Prezioso re: acute cough in pt with underlying PF maint rx ppi/h2hs  Chief Complaint  Patient presents with  . Acute Visit    Pt c/o cough and chest congestion- onset 5 days ago.  He has had some dark colored nasal d/c.    cc chronic nasal congestion for which he uses nettipot nightly x years but then much worse  Abruptly x one week prior to OV   then started  with cough 2 days p flare of nasal symptoms with nasal secrtions and sputum turning dark grey/green but no fever or sob   rec For cough > mucinex dm up to 1200 mg every 12 hours as needed  Augmentin 875 mg take one pill twice daily  X 10 days - take at breakfast and supper with large glass of water.  It would help reduce the usual side effects (diarrhea and yeast infections) if you ate cultured yogurt at lunch.  Please remember to go to the  x-ray department downstairs for your tests - we will call you with the results when they are available.   11/25/2016  f/u ov/Adarius Tigges re:  PF / cough resolved on gerd rx  Chief Complaint  Patient presents with  . Follow-up    Cough has resolved. No new co's.    6 days per week, treadmill at 5 degrees x 4.5 mph x 25 min with sats 92% and no change ex tol or sats  No cough at all  rec No change in acid suppression  Please see patient coordinator before you leave today  to schedule HRCT in 2 months    05/27/2017  f/u ov/Rai Severns re: PF ? UIP  Chief Complaint  Patient presents with  . Follow-up    Breathing is doing well and no new co's.   same level of ex/ no change in sats  Hoarse but no coughing    No obvious day to day or daytime variability or assoc excess/ purulent sputum or mucus plugs or hemoptysis or cp or chest tightness, subjective wheeze or overt sinus or hb symptoms. No unusual exposure hx or h/o childhood pna/ asthma or knowledge of premature birth.  Sleeping ok flat without nocturnal  or early am exacerbation  of respiratory  c/o's or need for noct saba. Also denies any obvious fluctuation of symptoms with weather or environmental changes or other aggravating or alleviating factors except as outlined above   Current Allergies, Complete Past Medical History, Past Surgical History, Family History, and Social History were reviewed in [employer] record.  ROS  The following are not active complaints unless bolded Hoarseness,  sore throat, dysphagia, dental problems, itching, sneezing,  nasal congestion or discharge of excess mucus or purulent secretions, ear ache,   fever, chills, sweats, unintended wt loss or wt gain, classically pleuritic or exertional cp,  orthopnea pnd or leg swelling, presyncope, palpitations, abdominal pain, anorexia, nausea, vomiting, diarrhea  or change in bowel habits or change in bladder habits, change in stools or change in urine, dysuria, hematuria,  rash, arthralgias, visual complaints, headache, numbness, weakness or ataxia or problems with walking or coordination,  change in mood/affect or memory.        Current Meds  Medication Sig  . aspirin 81 MG tablet Take 81 mg by mouth daily.    . Coenzyme Q10 (COQ10) 100 MG CAPS Take by mouth daily.  . famotidine (PEPCID) 20 MG tablet One at bedtime  . Multiple Vitamin (MULTIVITAMIN) tablet Take 1 tablet by mouth daily.    . pantoprazole (PROTONIX) 40 MG tablet Take 1 tablet (40 mg total) by mouth daily. Take 30-60 min before first meal of the day  . rosuvastatin (CRESTOR) 10 MG tablet Take 1 tablet (10 mg total) by mouth daily.  . S-Adenosylmethionine (SAM-E) 200 MG TABS Take by mouth daily.    . Saw Palmetto 450 MG CAPS Take by mouth daily.    . Turmeric 500 MG CAPS Take by mouth daily.                         Objective:   Physical Exam    Pleasant amb slt hoarse wm nad   05/27/2017        166  11/25/2016          167  06/27/2016          163 05/30/2016        168   02/29/16 169 lb 12.8 oz (77 kg)  01/25/16 160 lb 8 oz (72.8 kg)  01/23/15 170 lb (77.1 kg)      Vital signs reviewed - Note on arrival 02 sats  99% on RA      HEENT: nl dentition, turbinates bilaterally, and oropharynx. Nl external ear canals without cough reflex   NECK :  without JVD/Nodes/TM/ nl carotid upstrokes bilaterally   LUNGS: no acc muscle use,  Nl contour chest with slt coarsening of breath sounds, minimal bibasilar crackles w/o cough on deep  insp    CV:  RRR  no s3 or murmur or increase in P2, and no edema  ABD:  soft and nontender with nl inspiratory excursion in the supine position. No bruits or organomegaly appreciated, bowel sounds nl  MS:  Nl gait/ ext warm without deformities, calf tenderness, cyanosis - no def  clubbing No obvious joint restrictions   SKIN: warm and dry without lesions    NEURO:  alert, approp, nl sensorium with  no motor or cerebellar deficits apparent.       I personally reviewed images and agree with radiology impression as follows:   Chest CT = hrct  01/24/17 - HRCT  01/24/2017 1. There is a spectrum of findings compatible with interstitial lung disease, an although the overall appearance is considered a "probable UIP CT pattern", the lack of progression compared to the prior study, lack of frank honeycombing and presence of air trapping raises the possibility of fibrotic phase nonspecific interstitial pneumonia (NSIP).             Assessment:

## 2017-05-27 NOTE — Assessment & Plan Note (Signed)
Ct scan 01/2016:  No suspicious pulmonary nodules. Changes of pulmonary fibrosis, most pronounced in the mid and lower lung zones. Borderline sized mediastinal lymph nodes, likely reactive. - PFT's  05/30/2016  FEV1 3.07 (101 % ) ratio 81  p 2 % improvement from saba p nothing  prior to study with DLCO  54/59 % corrects to 76 % for alv volume   - HRCT  01/24/2017 1. There is a spectrum of findings compatible with interstitial lung disease, an although the overall appearance is considered a "probable UIP CT pattern", the lack of progression compared to the prior study, lack of frank honeycombing and presence of air trapping raises the possibility of fibrotic phase nonspecific interstitial pneumonia (NSIP)   Discussed in detail all the  indications, usual  risks and alternatives  relative to the benefits with patient who agrees to proceed with conservative f/u with max gerd rx/ monitoring sats and f/u in 6 m with pfts to see if any measurable drop in VC or DLCO and if so early HRCT, o/w wait until 01/2018 as per radiology recs   I had an extended discussion with the patient reviewing all relevant studies completed to date and  lasting 15 to 20 minutes of a 25 minute visit    Each maintenance medication was reviewed in detail including most importantly the difference between maintenance and prns and under what circumstances the prns are to be triggered using an action plan format that is not reflected in the computer generated alphabetically organized AVS.    Please see AVS for specific instructions unique to this visit that I personally wrote and verbalized to the the pt in detail and then reviewed with pt  by my nurse highlighting any  changes in therapy recommended at today's visit to their plan of care.

## 2017-06-28 IMAGING — DX DG CHEST 2V
2 series · 2 of 2 positions shown · non-contrast
Comparison: PA and lateral chest x-ray January 23, 2015

CLINICAL DATA: Follow-up of left years abnormal chest x-ray. Nipple
markers were used today. Hyperlipidemia, former smoker.
Hyperlipidemia

EXAM:
CHEST  2 VIEW

[chest pa]
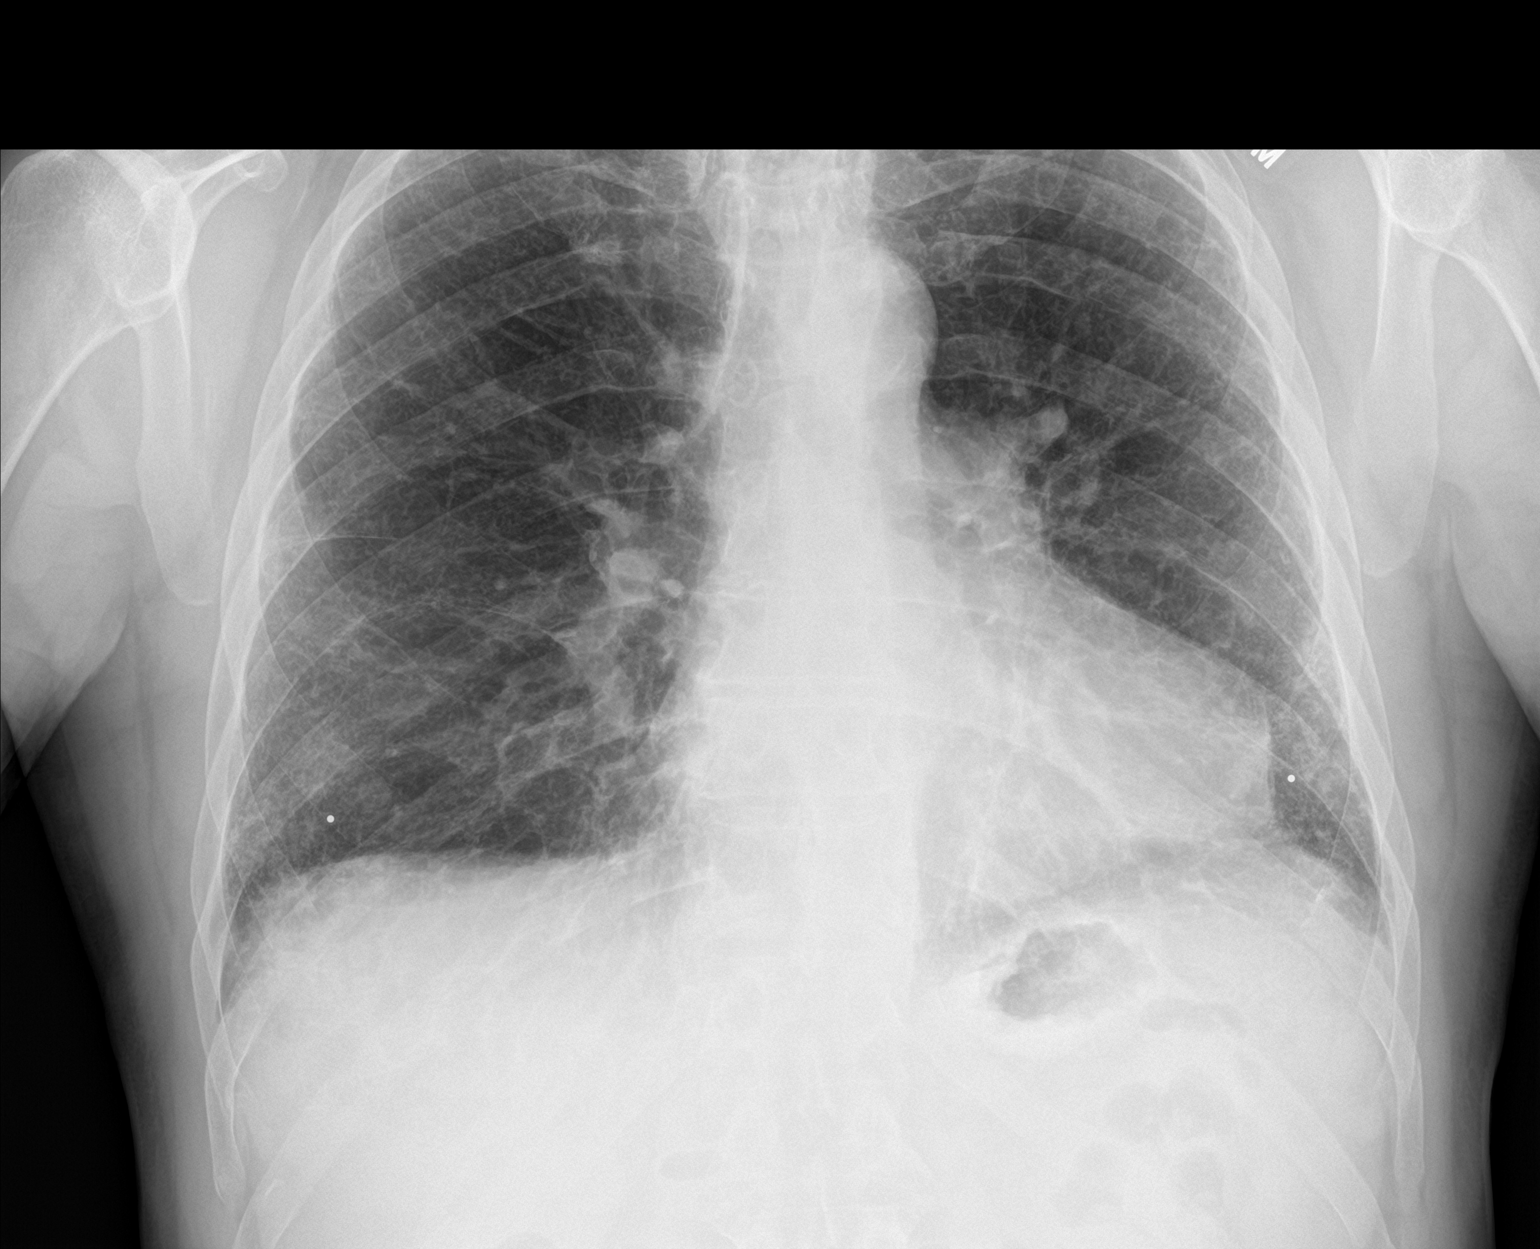

[chest lat]
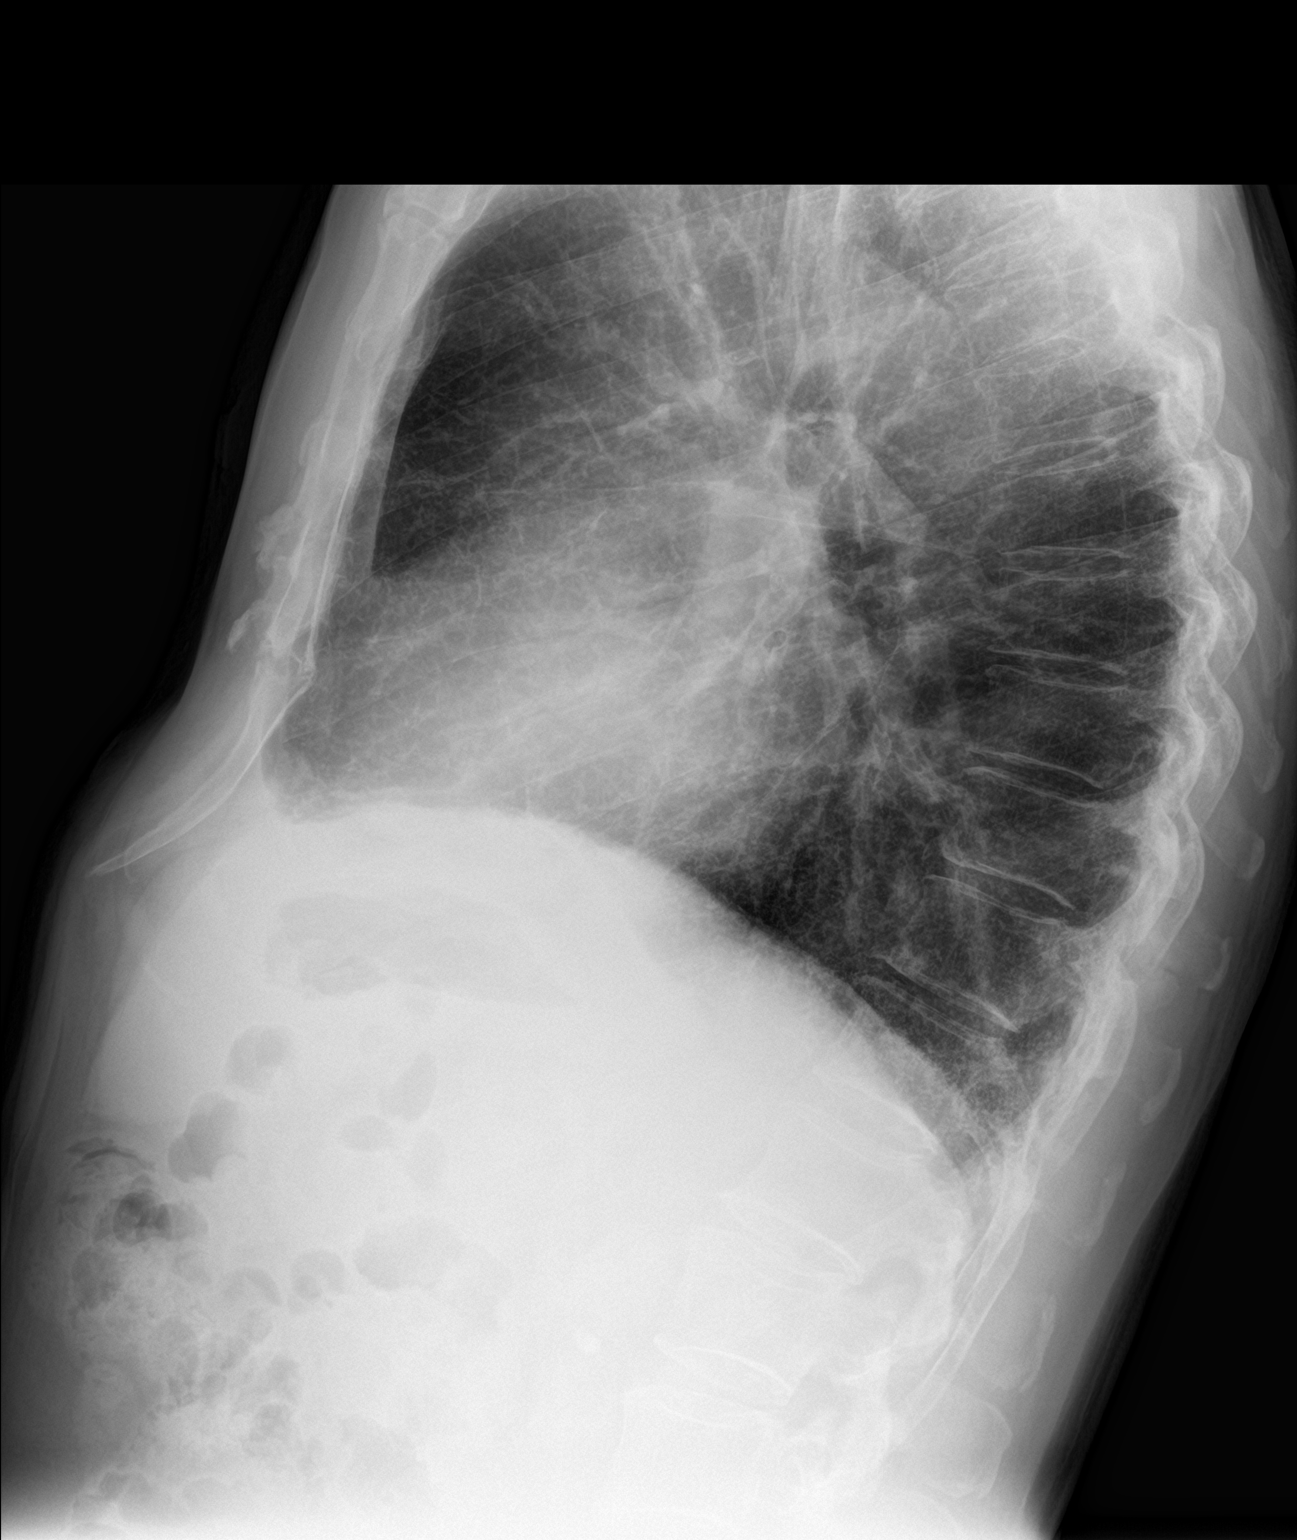

[2 of 2 positions shown; findings below may reference images not displayed]

FINDINGS: The lungs are adequately inflated. The interstitial markings are
coarse. There is no alveolar infiltrate or pleural effusion. There
is a stable 9 mm nodule that projects over the mid portion of the
right hemidiaphragm and lies medial to the marked nipple. The
cardiac silhouette is top-normal in size. The pulmonary vascularity
is normal. The mediastinum is normal in width. The bony thorax
exhibits no acute abnormality.
IMPRESSION: COPD with mild pulmonary fibrotic change, stable. Stable 9 mm nodule
projecting just above the right hemidiaphragm on the frontal view
only. This does not appear to reflect a nipple shadow given that the
nipple marker is lateral to this. Further evaluation of the lung
parenchyma with chest CT scanning is recommended.

## 2017-07-09 IMAGING — CT CT CHEST W/O CM
2 of 3 series · 15 of 36 positions shown, 18 images · non-contrast
Comparison: Chest x-ray [DATE]

CLINICAL DATA: Followup lung nodule seen on chest x-ray

EXAM:
CT CHEST WITHOUT CONTRAST
TECHNIQUE: Multidetector CT imaging of the chest was performed following the
standard protocol without IV contrast.

[Series 2: thorax · axial · 0.75mm/px · z∈[-292,-22]mm · 12 of 159 slices shown, 15 images]
[im 12/159  mediastinal]
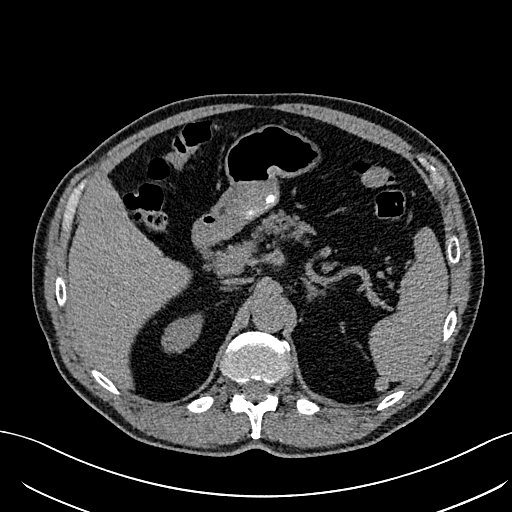
[im 12/159  lung]
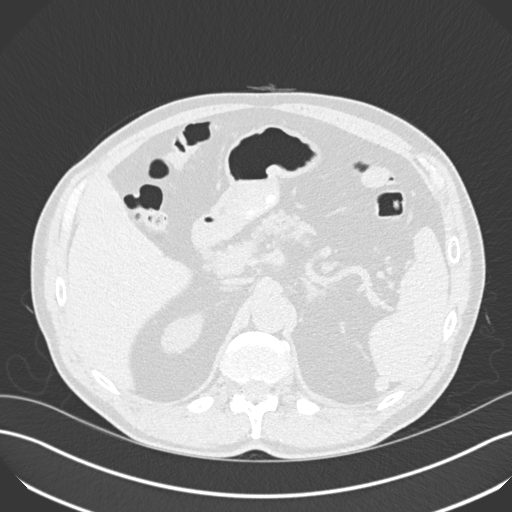
[im 24/159  lung]
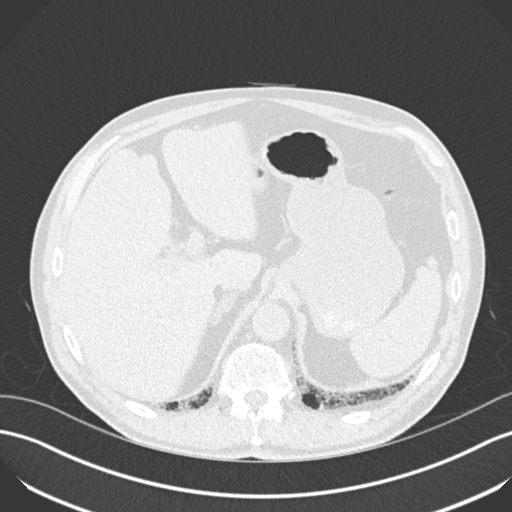
[im 36/159  lung]
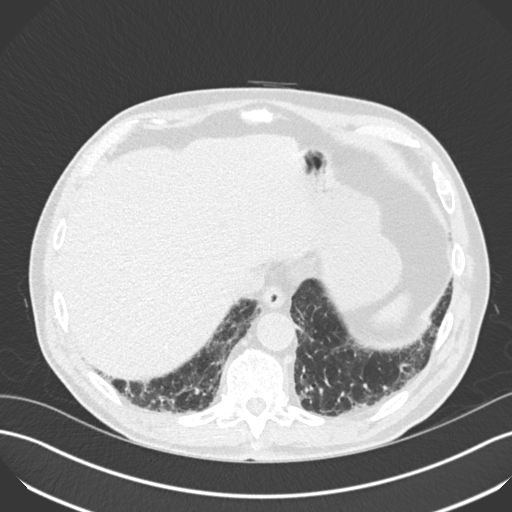
[im 47/159  lung]
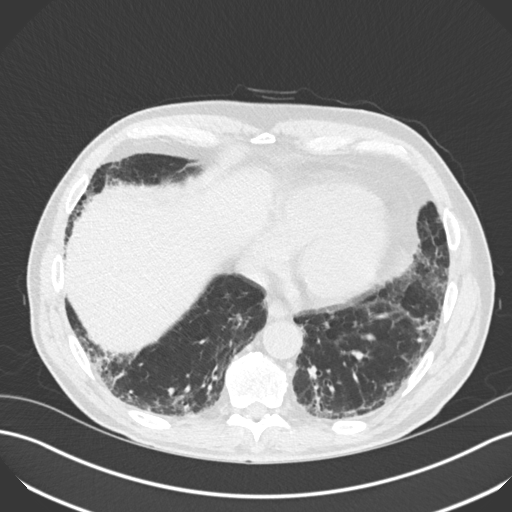
[im 59/159  mediastinal]
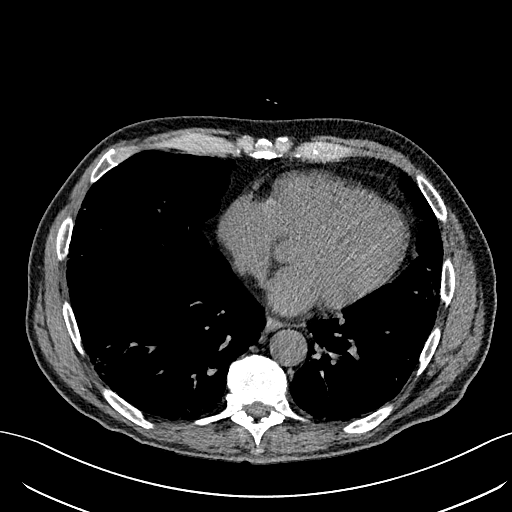
[im 59/159  lung]
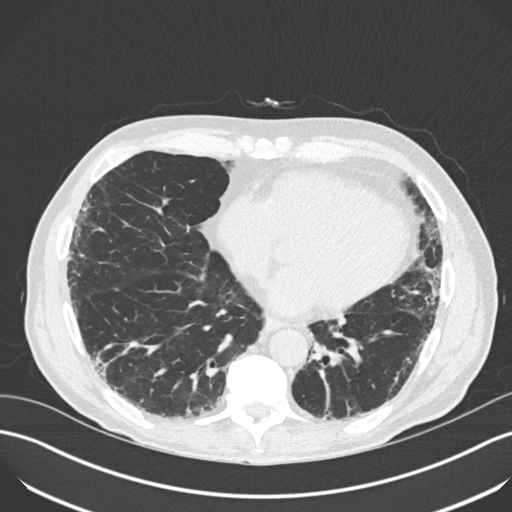
[im 71/159  lung]
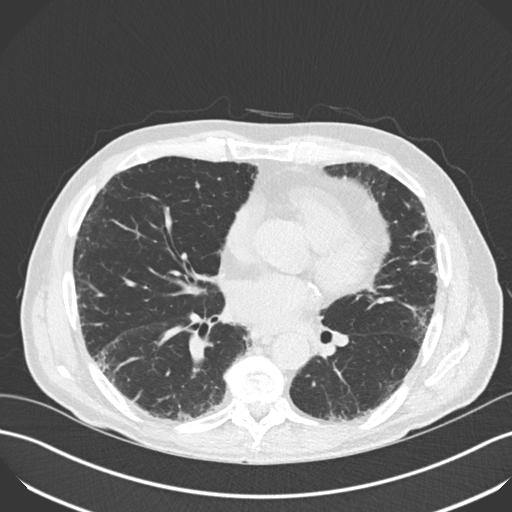
[im 88/159  lung]
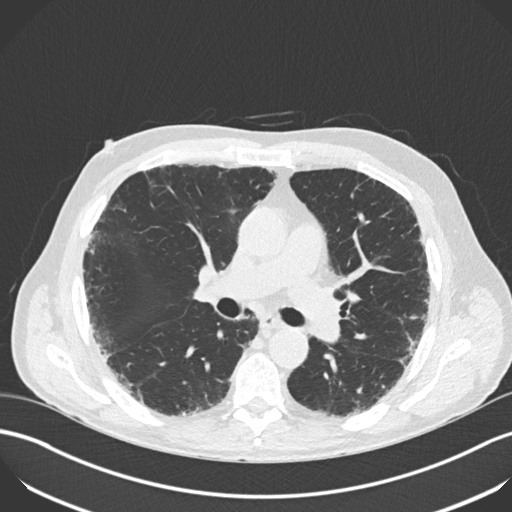
[im 100/159  lung]
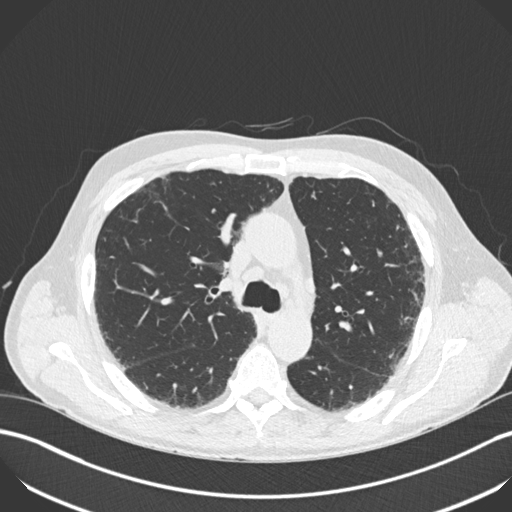
[im 112/159  mediastinal]
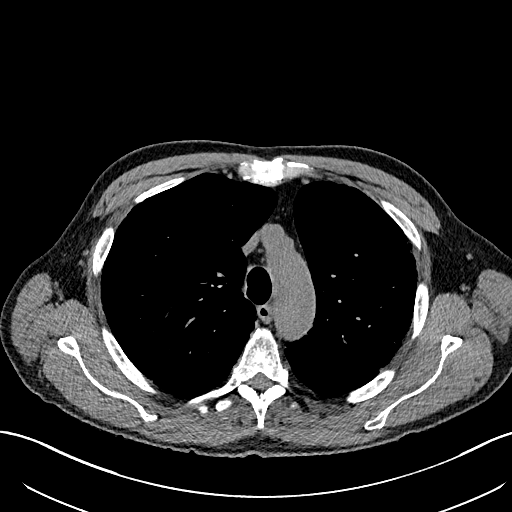
[im 112/159  lung]
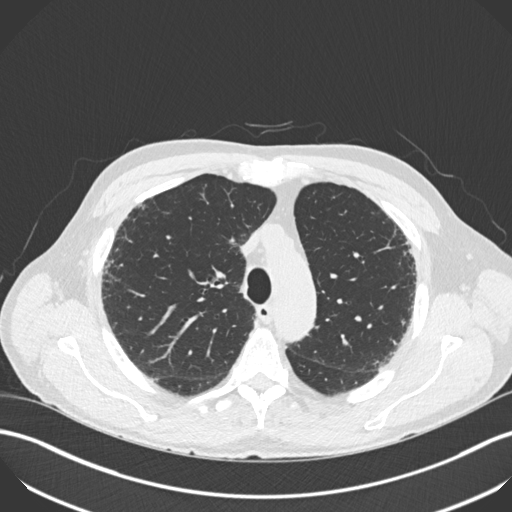
[im 123/159  lung]
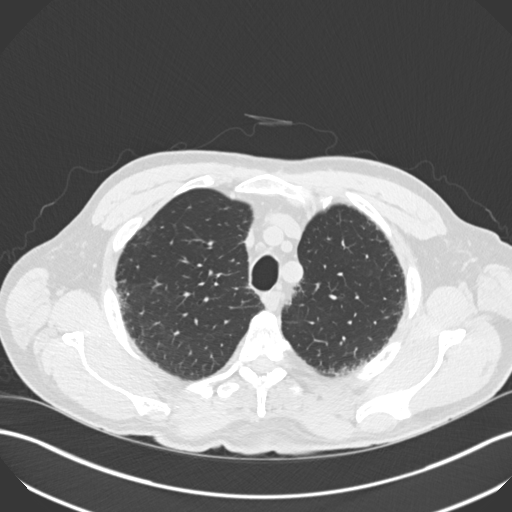
[im 135/159  lung]
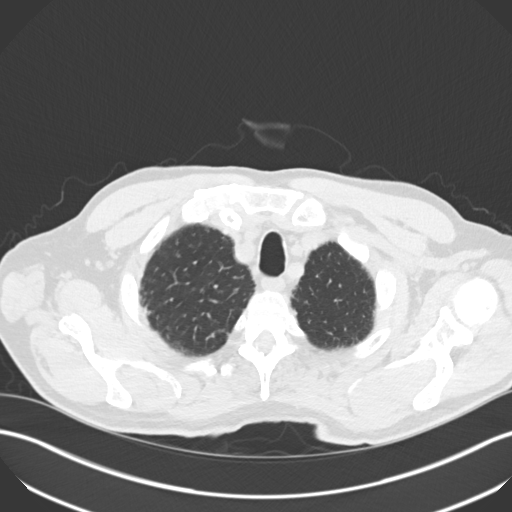
[im 147/159  lung]
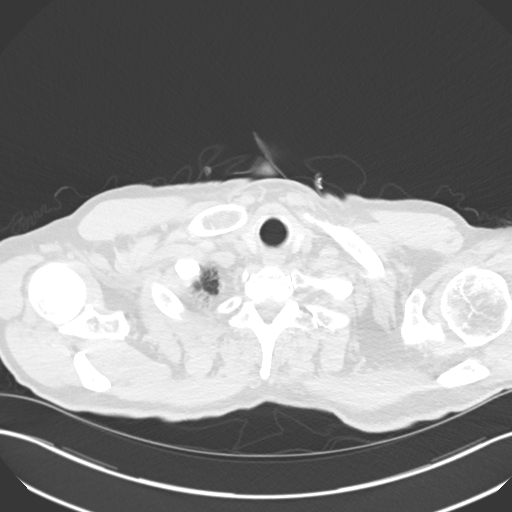

[Series 5: coronal · coronal · 0.62mm/px · 3 of 127 slices shown]
[im 26/127  lung]
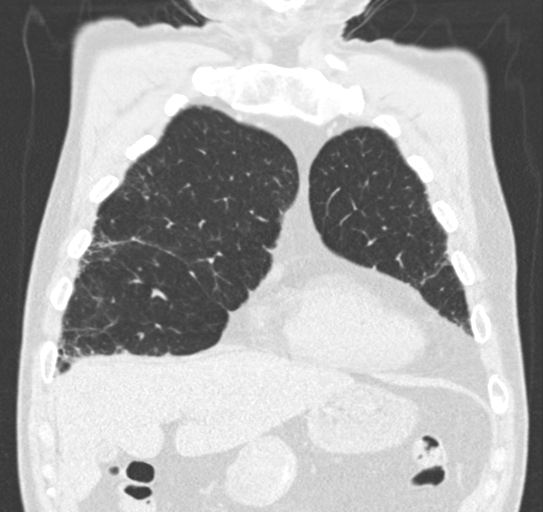
[im 51/127  lung]
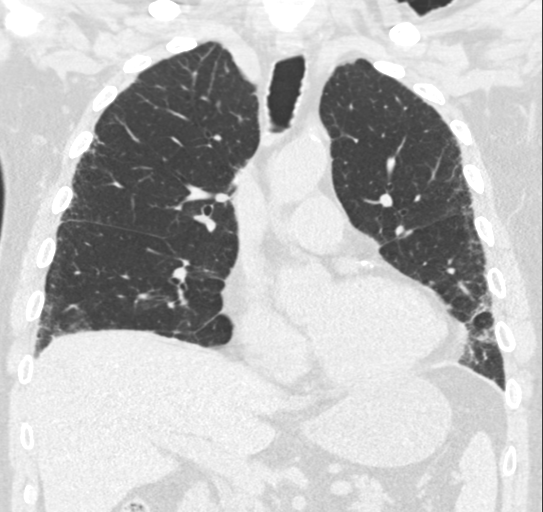
[im 76/127  lung]
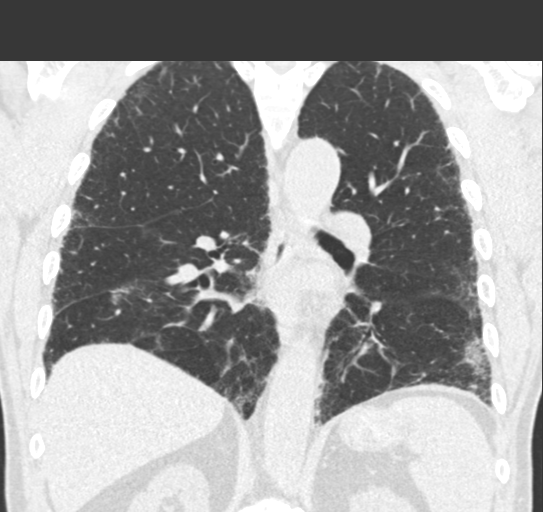

[15 of 36 positions shown; findings below may reference images not displayed]

FINDINGS: Cardiovascular: Scattered aortic calcifications within the aortic
arch. Coronary artery calcifications in all 3 major coronary
vessels. Heart is upper limits normal in size.

Mediastinum/Nodes: Borderline size right paratracheal lymph node
measuring up to 8 mm in short axis diameter. Similarly sized
subcarinal and AP window lymph nodes. No axillary adenopathy. No
visible hilar adenopathy.

Lungs/Pleura: Changes of pulmonary fibrosis peripherally in the
lungs, most pronounced in the mid and lower lung zones. No confluent
areas of consolidation. No pleural effusions. No suspicious
pulmonary nodules. Specifically, no pulmonary nodule noted at the
right base in the area questioned on prior chest x-ray.

Upper Abdomen: Imaging into the upper abdomen shows no acute
findings.

Musculoskeletal: No acute bony abnormality. Chest wall soft tissues
are unremarkable.
IMPRESSION: No suspicious pulmonary nodules. Changes of pulmonary fibrosis, most
pronounced in the mid and lower lung zones.

Borderline sized mediastinal lymph nodes, likely reactive.

Coronary artery disease.  Aortic atherosclerosis.

## 2017-09-17 DIAGNOSIS — L821 Other seborrheic keratosis: Secondary | ICD-10-CM | POA: Diagnosis not present

## 2017-09-17 DIAGNOSIS — D1801 Hemangioma of skin and subcutaneous tissue: Secondary | ICD-10-CM | POA: Diagnosis not present

## 2017-09-17 DIAGNOSIS — L400 Psoriasis vulgaris: Secondary | ICD-10-CM | POA: Diagnosis not present

## 2017-09-17 DIAGNOSIS — L570 Actinic keratosis: Secondary | ICD-10-CM | POA: Diagnosis not present

## 2017-09-17 DIAGNOSIS — D692 Other nonthrombocytopenic purpura: Secondary | ICD-10-CM | POA: Diagnosis not present

## 2017-10-16 ENCOUNTER — Telehealth: Payer: Self-pay | Admitting: Emergency Medicine

## 2017-10-16 NOTE — Telephone Encounter (Signed)
Called patient to schedule [facility]. Patient declined at this time. Wants to speak with Dr Quay Burow about it first.

## 2017-11-06 ENCOUNTER — Other Ambulatory Visit: Payer: Self-pay | Admitting: Internal Medicine

## 2017-11-06 DIAGNOSIS — H26491 Other secondary cataract, right eye: Secondary | ICD-10-CM | POA: Diagnosis not present

## 2017-11-06 DIAGNOSIS — H5203 Hypermetropia, bilateral: Secondary | ICD-10-CM | POA: Diagnosis not present

## 2017-11-06 DIAGNOSIS — H52223 Regular astigmatism, bilateral: Secondary | ICD-10-CM | POA: Diagnosis not present

## 2017-11-06 DIAGNOSIS — J8410 Pulmonary fibrosis, unspecified: Secondary | ICD-10-CM

## 2017-11-06 DIAGNOSIS — H524 Presbyopia: Secondary | ICD-10-CM | POA: Diagnosis not present

## 2017-11-25 ENCOUNTER — Ambulatory Visit (INDEPENDENT_AMBULATORY_CARE_PROVIDER_SITE_OTHER): Payer: Medicare Other | Admitting: Internal Medicine

## 2017-11-25 ENCOUNTER — Encounter: Payer: Self-pay | Admitting: Internal Medicine

## 2017-11-25 VITALS — BP 130/74 | HR 60 | Ht 71.0 in | Wt 165.0 lb

## 2017-11-25 DIAGNOSIS — J8410 Pulmonary fibrosis, unspecified: Secondary | ICD-10-CM

## 2017-11-25 LAB — PULMONARY FUNCTION TEST
DL/VA % PRED: 78 %
DL/VA: 3.6 ml/min/mmHg/L
DLCO unc % pred: 57 %
DLCO unc: 18.86 ml/min/mmHg
FEF 25-75 POST: 2.93 L/s
FEF 25-75 Pre: 2.75 L/sec
FEF2575-%CHANGE-POST: 6 %
FEF2575-%Pred-Post: 142 %
FEF2575-%Pred-Pre: 134 %
FEV1-%Change-Post: 1 %
FEV1-%PRED-PRE: 102 %
FEV1-%Pred-Post: 104 %
FEV1-PRE: 3.02 L
FEV1-Post: 3.07 L
FEV1FVC-%CHANGE-POST: 2 %
FEV1FVC-%PRED-PRE: 110 %
FEV6-%Change-Post: -1 %
FEV6-%PRED-PRE: 97 %
FEV6-%Pred-Post: 96 %
FEV6-POST: 3.74 L
FEV6-Pre: 3.79 L
FEV6FVC-%Change-Post: 0 %
FEV6FVC-%PRED-POST: 106 %
FEV6FVC-%Pred-Pre: 107 %
FVC-%CHANGE-POST: 0 %
FVC-%Pred-Post: 91 %
FVC-%Pred-Pre: 91 %
FVC-PRE: 3.8 L
FVC-Post: 3.77 L
POST FEV6/FVC RATIO: 99 %
PRE FEV6/FVC RATIO: 100 %
Post FEV1/FVC ratio: 81 %
Pre FEV1/FVC ratio: 79 %
RV % pred: -20 %
RV: -0.55 L
TLC % PRED: 75 %
TLC: 5.43 L

## 2017-11-25 NOTE — Progress Notes (Signed)
Subjective:     Patient ID: Walter Hess, male   DOB: 1940/07/25     MRN: [id number]    Brief patient profile:  53 yowm never regular smoker stopped completely  in his 20's and no lower resp problems just sinus symptoms starting in 40s with recurrent infections  but no need for  specialty eval or need for rx then routine cxr suggested lung nodule in 01/2015 repeat in 01/2016 suggested PF so referred to pulmonary clinic 02/29/2016 by Dr  Quay Burow     History of Present Illness  02/29/2016 1st [facility] office visit/ Walter Hess   Chief Complaint  Patient presents with  . Pulmonary Consult    Referred by Dr. Billey Gosling for abnormal cxr. Pt denies any respiratory co's today.    6 days a week does 5 degrees of elevation at 3-4 mph x 25 min s sob x 40 year pattern s change rec Stay as active as you can and let me know if you are  losing ground with exercise or your oxygen saturation is dropping below your baseline   - above 90% is normal - pulse oximeter is the name of the instrument    05/30/2016  f/u ov/Walter Hess re: pf/ no change ex tol  Chief Complaint  Patient presents with  . Follow-up    Breathing is unchanged. No new co's today. PFT's done.   with peak exercise on his home treamill no limiting sob and  never less than 94% sats rec Pantoprazole (protonix) 40 mg   Take  30-60 min before first meal of the day and Pepcid (famotidine)  20 mg one @  bedtime until return to office - this is the best way to tell whether stomach acid is contributing to your problem.  Stop fish oil and eat more fish  GERD diet    06/27/2016 acute extended ov/Walter Hess re: acute cough in pt with underlying PF maint rx ppi/h2hs  Chief Complaint  Patient presents with  . Acute Visit    Pt c/o cough and chest congestion- onset 5 days ago.  He has had some dark colored nasal d/c.    cc chronic nasal congestion for which he uses nettipot nightly x years but then much worse  Abruptly x one week prior to OV   then started  with cough 2 days p flare of nasal symptoms with nasal secrtions and sputum turning dark grey/green but no fever or sob   rec For cough > mucinex dm up to 1200 mg every 12 hours as needed  Augmentin 875 mg take one pill twice daily  X 10 days - take at breakfast and supper with large glass of water.  It would help reduce the usual side effects (diarrhea and yeast infections) if you ate cultured yogurt at lunch.  Please remember to go to the  x-ray department downstairs for your tests - we will call you with the results when they are available.   11/25/2016  f/u ov/Walter Hess re:  PF / cough resolved on gerd rx  Chief Complaint  Patient presents with  . Follow-up    Cough has resolved. No new co's.    6 days per week, treadmill at 5 degrees x 4.5 mph x 25 min with sats 92% and no change ex tol or sats  No cough at all  rec No change in acid suppression  Please see patient coordinator before you leave today  to schedule HRCT in 2 months    05/27/2017  f/u ov/Walter Hess re: PF ? UIP  Chief Complaint  Patient presents with  . Follow-up    Breathing is doing well and no new co's.   same level of ex/ no change in sats  Hoarse but no coughing  rec  No change rx      11/25/2017  f/u ov/Walter Hess re: PF  Non dx HRCT only rx is for gerd Chief Complaint  Patient presents with  . Follow-up    Breathing is unchanged since last OV. Pt does not have any complaints today. PFT was performed.  Dyspnea:  No change in sats with ex  Cough: sporadic, not with ex Sleep: fine  No obvious day to day or daytime variability or assoc excess/ purulent sputum or mucus plugs or hemoptysis or cp or chest tightness, subjective wheeze or overt sinus or hb symptoms. No unusual exposure hx or h/o childhood pna/ asthma or knowledge of premature birth.  Sleeping  Flat   without nocturnal  or early am exacerbation  of respiratory  c/o's or need for noct saba. Also denies any obvious fluctuation of symptoms with weather or  environmental changes or other aggravating or alleviating factors except as outlined above   Current Allergies, Complete Past Medical History, Past Surgical History, Family History, and Social History were reviewed in [employer] record.  ROS  The following are not active complaints unless bolded Hoarseness, sore throat, dysphagia, dental problems, itching, sneezing,  nasal congestion or discharge of excess mucus or purulent secretions, ear ache,   fever, chills, sweats, unintended wt loss or wt gain, classically pleuritic or exertional cp,  orthopnea pnd or arm/hand swelling  or leg swelling, presyncope, palpitations, abdominal pain, anorexia, nausea, vomiting, diarrhea  or change in bowel habits or change in bladder habits, change in stools or change in urine, dysuria, hematuria,  rash, arthralgias, visual complaints, headache, numbness, weakness or ataxia or problems with walking or coordination,  change in mood or  memory.        Current Meds  Medication Sig  . aspirin 81 MG tablet Take 81 mg by mouth daily.    . Coenzyme Q10 (COQ10) 100 MG CAPS Take by mouth daily.  . famotidine (PEPCID) 20 MG tablet TAKE 1 TABLET BY MOUTH EVERYDAY AT BEDTIME  . Multiple Vitamin (MULTIVITAMIN) tablet Take 1 tablet by mouth daily.    . pantoprazole (PROTONIX) 40 MG tablet TAKE 1 TABLET (40 MG TOTAL) BY MOUTH DAILY. TAKE 30-60 MIN BEFORE FIRST MEAL OF THE DAY  . rosuvastatin (CRESTOR) 10 MG tablet Take 1 tablet (10 mg total) by mouth daily.  . S-Adenosylmethionine (SAM-E) 200 MG TABS Take by mouth daily.    . Saw Palmetto 450 MG CAPS Take by mouth daily.    . Turmeric 500 MG CAPS Take by mouth daily.                          Objective:   Physical Exam  Pleasant amb wm   11/25/2017          165  05/27/2017        166  11/25/2016          167  06/27/2016          163 05/30/2016        168   02/29/16 169 lb 12.8 oz (77 kg)  01/25/16 160 lb 8 oz (72.8 kg)  01/23/15 170 lb  (77.1 kg)  Vital signs reviewed - Note on arrival 02 sats  95% on RA       HEENT: nl dentition, turbinates bilaterally, and oropharynx. Nl external ear canals without cough reflex   NECK :  without JVD/Nodes/TM/ nl carotid upstrokes bilaterally   LUNGS: no acc muscle use,  Nl contour chest  With distant crackles on insp  bilaterally without cough on insp or exp maneuvers   CV:  RRR  no s3 or murmur or increase in P2, and no edema   ABD:  soft and nontender with nl inspiratory excursion in the supine position. No bruits or organomegaly appreciated, bowel sounds nl  MS:  Nl gait/ ext warm without deformities, calf tenderness, cyanosis  No  clubbing No obvious joint restrictions   SKIN: warm and dry without lesions    NEURO:  alert, approp, nl sensorium with  no motor or cerebellar deficits apparent.                 Assessment:

## 2017-11-25 NOTE — Patient Instructions (Addendum)
Continue your exercise program and let me know if you note any decline in 02 sats with exercise over time    Please schedule a follow up visit in 12  months but call sooner if needed with pfts on return

## 2017-11-25 NOTE — Progress Notes (Signed)
PFT completed today.  

## 2017-11-25 NOTE — Assessment & Plan Note (Signed)
Ct scan 01/2016:  No suspicious pulmonary nodules. Changes of pulmonary fibrosis, most pronounced in the mid and lower lung zones. Borderline sized mediastinal lymph nodes, likely reactive. - PFT's  05/30/2016  FEV1 3.07 (101 % ) ratio 81  p 2 % improvement from saba p nothing  prior to study with DLCO  54/59 % corrects to 76 % for alv volume   - HRCT  01/24/2017 1. There is a spectrum of findings compatible with interstitial lung disease, an although the overall appearance is considered a "probable UIP CT pattern", the lack of progression compared to the prior study, lack of frank honeycombing and presence of air trapping raises the possibility of fibrotic phase nonspecific interstitial pneumonia (NSIP). Repeat high-resolution chest CT is suggested in 12 months to assess for temporal changes in the appearance of the lung parenchyma. 2. Aortic atherosclerosis, in addition to three-vessel coronary artery disease. - PFT's  11/25/2017  FVC 3.80 (91%) with DLCO  57 % corrects to 78 % for alv volume     So far has had no decline in FVC, dlco or ex sats x 1.5 years so ok to repeat in one year  In meantime, Use of PPI is associated with improved survival time and with decreased radiologic fibrosis per King's study published in AJRCCM vol 184 p1390.  Dec 2011 and also may have other beneficial effects as per the latest review in [place] vol 193 F0263 Jun 20016.  This may not always be cause and effect, but given how universally unimpressive and expensive  all the other  Drugs developed to day  have been for pf,   rec continue  rx ppi / diet/ lifestyle modification and f/u with serial walking sats and lung volumes for now to put more points on the curve / establish firm baseline before considering additional measures.    Discussed in detail all the  indications, usual  risks and alternatives  relative to the benefits with patient who agrees to proceed with conservative f/u as outlined    I had an extended  discussion with the patient reviewing all relevant studies completed to date and  lasting 15 to 20 minutes of a 25 minute visit    Each maintenance medication was reviewed in detail including most importantly the difference between maintenance and prns and under what circumstances the prns are to be triggered using an action plan format that is not reflected in the computer generated alphabetically organized AVS.    Please see AVS for specific instructions unique to this visit that I personally wrote and verbalized to the the pt in detail and then reviewed with pt  by my nurse highlighting any  changes in therapy recommended at today's visit to their plan of care.

## 2017-11-29 IMAGING — DX DG CHEST 2V
2 series · 2 of 2 positions shown · non-contrast
Comparison: CT chest scan of 02/05/2016 and chest x-ray of
01/25/2016

CLINICAL DATA: Cough, history of pulmonary fibrosis

EXAM:
CHEST  2 VIEW

[chest pa]
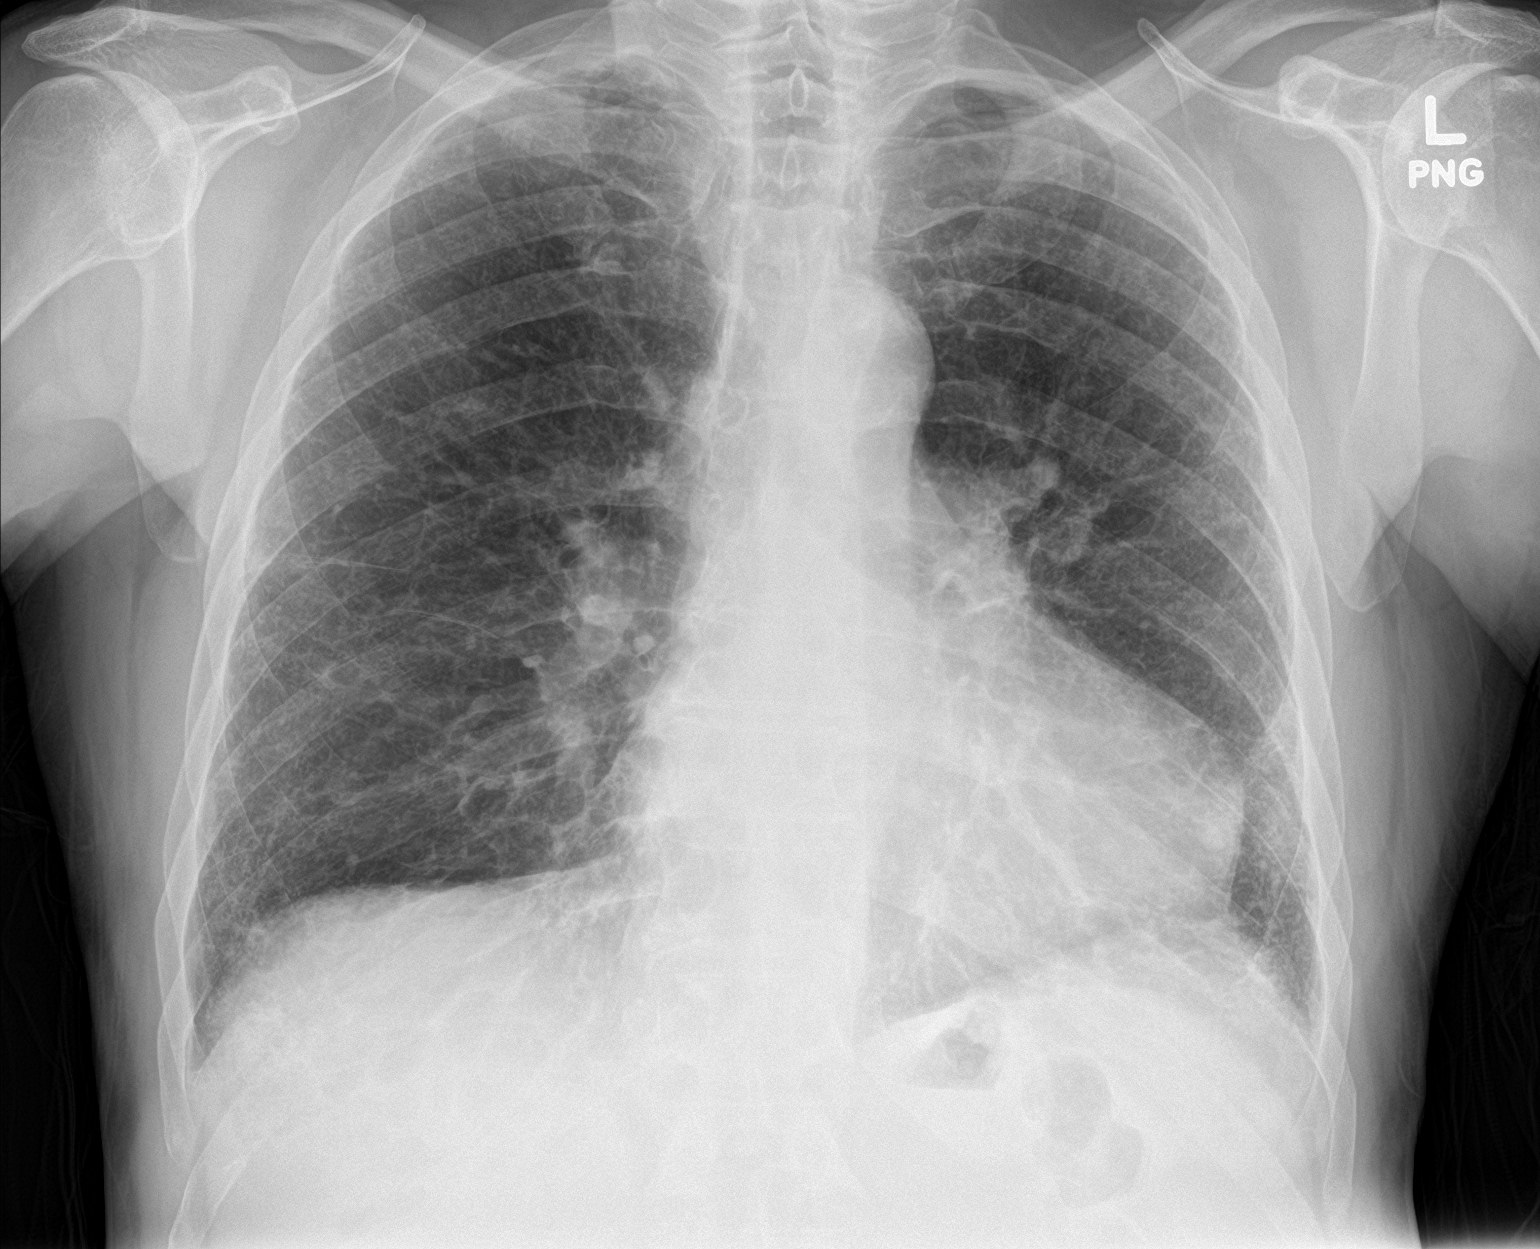

[chest lat]
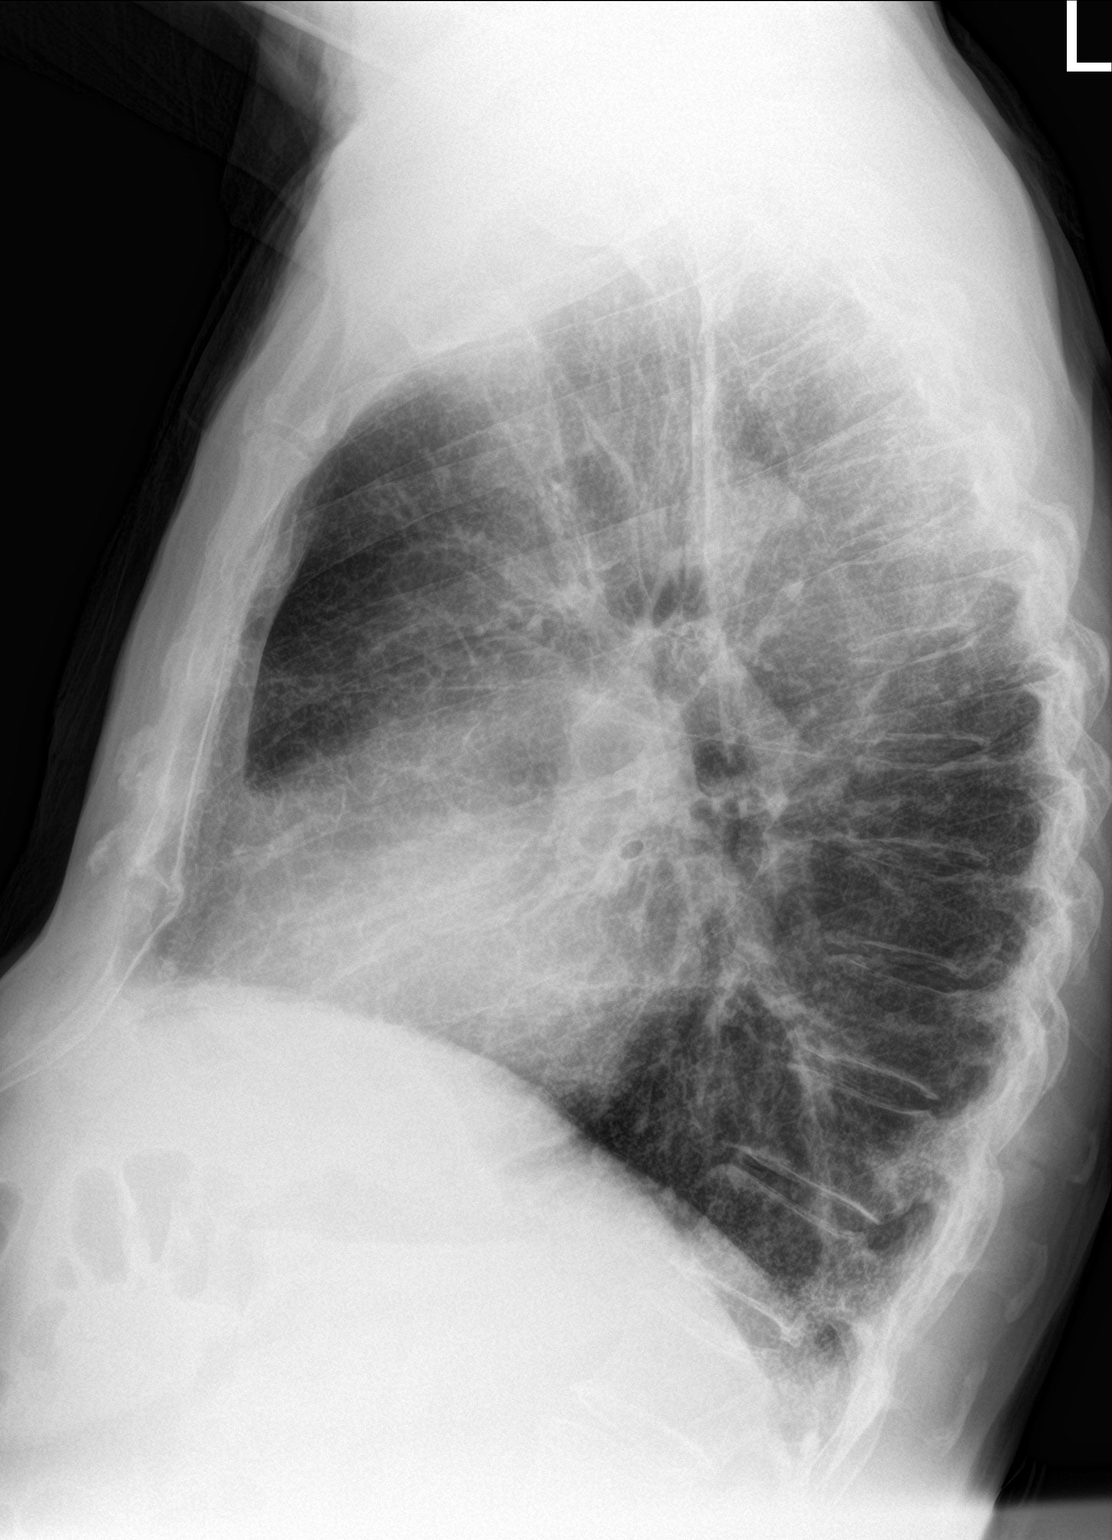

[2 of 2 positions shown; findings below may reference images not displayed]

FINDINGS: Coarse interstitial lung markings remain in this patient with
pulmonary fibrosis described on prior CT of the chest. No definite
active pneumonia or effusion is seen. Mediastinal and hilar contours
are unremarkable. The heart is within upper limits of normal. No
bony abnormality is seen.
IMPRESSION: Stable chronic changes of pulmonary fibrosis. No definite active
process.

## 2018-01-12 DIAGNOSIS — M720 Palmar fascial fibromatosis [Dupuytren]: Secondary | ICD-10-CM | POA: Diagnosis not present

## 2018-01-12 DIAGNOSIS — M19042 Primary osteoarthritis, left hand: Secondary | ICD-10-CM | POA: Diagnosis not present

## 2018-01-14 ENCOUNTER — Other Ambulatory Visit: Payer: Self-pay | Admitting: Internal Medicine

## 2018-01-15 DIAGNOSIS — N401 Enlarged prostate with lower urinary tract symptoms: Secondary | ICD-10-CM | POA: Diagnosis not present

## 2018-01-15 DIAGNOSIS — R972 Elevated prostate specific antigen [PSA]: Secondary | ICD-10-CM | POA: Diagnosis not present

## 2018-01-23 ENCOUNTER — Other Ambulatory Visit: Payer: Self-pay

## 2018-01-27 NOTE — Progress Notes (Signed)
Subjective:    Patient ID: Walter Hess, male    DOB: 03-Apr-1941, 77 y.o.   MRN: [id number]  HPI Here for medicare wellness exam and follow up of his chronic medical problems.   I have personally reviewed and have noted 1.The patient's medical and social history 2.Their use of alcohol, tobacco or illicit drugs 3.Their current medications and supplements 4.The patient's functional ability including ADL's, fall risks, home                 safety risk and hearing or visual impairment. 5.Diet and physical activities 6.Evidence for depression or mood disorders 7.Care team reviewed  -  Urology - Dr Amalia Hailey, Derm - Dr Wilhemina Bonito, Pulmonary - Dr Radene Knee ENT for hearing - Dr Janace Hoard, Eye - Dr Sabra Heck   Hyperlipidemia: He is taking his medication daily. He is compliant with a low fat/cholesterol diet. He is exercising regularly. He denies myalgias.   Prediabetes:  He is somewhat compliant with a low sugar/carbohydrate diet.  He is exercising regularly.   GERD:  He is taking his medication daily as prescribed.  He denies any GERD symptoms and feels his GERD is well controlled.    Are there smokers in your home (other than you)? No  Risk Factors Exercise:   regular Dietary issues discussed: eating too many sugars.  Well balanced, does not overeat.  He eats lots of fruits and vegetables.  Vitamin and supplement use: Co Q10, MVI, saw palmetto, tumeric, SAM-E  Opiod use:  none Side effects from medication:  n/a Does medications benefits outweigh risks/side effects:    n/a  Cardiac risk factors: advanced age, hyperlipidemia  Depression Screen  Have you felt down, depressed or hopeless? No  Have you felt little interest or pleasure in doing things?  No  Activities of Daily Living In your present state of health, do you have any difficulty performing the following activities?:  Driving? No Managing money?   No Feeding yourself? No Getting from bed to chair? No Climbing a flight of stairs? No Preparing food and eating?: No Bathing or showering? No Getting dressed: No Getting to/using the toilet? No Moving around from place to place: No In the past year have you fallen or had a near fall?: No   Are you sexually active?  No  Do you have more than one partner?  N/A  Hearing Difficulties: yes - hearing aids Do you often ask people to speak up or repeat themselves?  yes Do you experience ringing or noises in your ears? No Do you have difficulty understanding soft or whispered voices? yes Vision:              Any change in vision:   Dec vision from cataract - has f/u             Up to date with eye exam:   yes  Memory:  Do you feel that you have a problem with memory? No  Do you often misplace items? No  Do you feel safe at home?  Yes  Cognitive Testing  Alert, Orientated? Yes  Normal Appearance? Yes  Recall of three objects?  Yes  Can perform simple calculations? Yes  Displays appropriate judgment? Yes  Can read the correct time from a watch face? Yes   Advanced Directives have been discussed with the patient? Yes - in place, no questions     Medications and allergies reviewed with patient and updated if appropriate.  Patient Active  Problem List   Diagnosis Date Noted  . Aortic atherosclerosis ([place]) 01/28/2018  . Decreased hearing 01/27/2017  . GERD (gastroesophageal reflux disease) 01/26/2017  . Cough 06/27/2016  . Sinusitis, chronic 02/29/2016  . Postinflammatory pulmonary fibrosis ([place]) 02/29/2016  . Chronic kidney disease 01/30/2016  . Lung nodule 01/28/2016  . Prediabetes 01/25/2016  . Vertebral basilar insufficiency 01/25/2016  . Abnormal chest x-ray 01/25/2016  . DUPUYTREN'S CONTRACTURE, RIGHT 10/05/2009  . History of anemia 09/22/2008  . HYPERLIPIDEMIA 06/24/2007  . DIVERTICULOSIS, COLON 06/24/2007  . PROSTATE SPECIFIC ANTIGEN( PSA), ELEVATED 06/24/2007  .  NONSPECIFIC ABNORMAL ELECTROCARDIOGRAM 06/24/2007    Current Outpatient Medications on File Prior to Visit  Medication Sig Dispense Refill  . aspirin 81 MG tablet Take 81 mg by mouth daily.      . Coenzyme Q10 (COQ10) 100 MG CAPS Take by mouth daily.    . famotidine (PEPCID) 20 MG tablet TAKE 1 TABLET BY MOUTH EVERYDAY AT BEDTIME 90 tablet 0  . Multiple Vitamin (MULTIVITAMIN) tablet Take 1 tablet by mouth daily.      . pantoprazole (PROTONIX) 40 MG tablet TAKE 1 TABLET (40 MG TOTAL) BY MOUTH DAILY. TAKE 30-60 MIN BEFORE FIRST MEAL OF THE DAY 90 tablet 0  . rosuvastatin (CRESTOR) 10 MG tablet TAKE 1 TABLET BY MOUTH EVERY DAY 90 tablet 0  . S-Adenosylmethionine (SAM-E) 200 MG TABS Take by mouth daily.      . Saw Palmetto 450 MG CAPS Take by mouth daily.      . Turmeric 500 MG CAPS Take by mouth daily.       No current facility-administered medications on file prior to visit.     Past Medical History:  Diagnosis Date  . Anemia    former blood donor for decades; stopped 2006  . Diverticulosis 2005   Dr. Olevia Perches  . Elevated prostate specific antigen (PSA)    Dr. Amalia Hailey [facility]  . Gilbert's syndrome   . Hyperlipidemia     Past Surgical History:  Procedure Laterality Date  . CATARACT EXTRACTION, BILATERAL  2017  . COLONOSCOPY  2005   Diverticulosis, Dr.Brodie.  Marland Kitchen GANGLION CYST EXCISION  2005   5th L digit  . LUMBAR LAMINECTOMY  1981    Social History   Socioeconomic History  . Marital status: Married    Spouse name: Not on file  . Number of children: Not on file  . Years of education: Not on file  . Highest education level: Not on file  Occupational History  . Not on file  Social Needs  . Financial resource strain: Not on file  . Food insecurity:    Worry: Not on file    Inability: Not on file  . Transportation needs:    Medical: Not on file    Non-medical: Not on file  Tobacco Use  . Smoking status: Former Smoker    Packs/day: 0.50    Years: 5.00    Pack years:  2.50    Last attempt to quit: 06/25/1963    Years since quitting: 54.6  . Smokeless tobacco: Never Used  Substance and Sexual Activity  . Alcohol use: Yes    Alcohol/week: 0.0 oz    Comment: beer occasionally  . Drug use: No  . Sexual activity: Not on file  Lifestyle  . Physical activity:    Days per week: Not on file    Minutes per session: Not on file  . Stress: Not on file  Relationships  . Social connections:  Talks on phone: Not on file    Gets together: Not on file    Attends religious service: Not on file    Active member of club or organization: Not on file    Attends meetings of clubs or organizations: Not on file    Relationship status: Not on file  Other Topics Concern  . Not on file  Social History Narrative  . Not on file    Family History  Problem Relation Age of Onset  . Heart attack Father 58       former smoker  . Alzheimer's disease Mother   . Coronary artery disease Sister        open heart surgery  . Diabetes Paternal Aunt   . Heart attack Paternal Uncle 63  . Coronary artery disease Paternal Grandfather   . Prostate cancer Paternal Grandfather   . Prostate cancer Maternal Uncle   . Stroke Neg Hx   . Colon cancer Neg Hx     Review of Systems  Constitutional: Negative for chills and fever.  Eyes: Positive for visual disturbance (has f/u scheduled).  Respiratory: Negative for cough, shortness of breath and wheezing.   Cardiovascular: Negative for chest pain, palpitations and leg swelling.  Gastrointestinal: Negative for abdominal pain, blood in stool, constipation, diarrhea and nausea.       No gerd  Genitourinary: Negative for dysuria and hematuria.  Musculoskeletal: Positive for arthralgias (hands). Negative for back pain.  Skin: Negative for color change and rash.  Neurological: Negative for light-headedness and headaches.  Psychiatric/Behavioral: Negative for dysphoric mood.       Objective:   Vitals:   01/28/18 0802  BP: 138/70    Pulse: 75  Resp: 16  Temp: 97.6 F (36.4 C)  SpO2: 97%   Filed Weights   01/28/18 0802  Weight: 167 lb (75.8 kg)   Body mass index is 23.29 kg/m.  Wt Readings from Last 3 Encounters:  01/28/18 167 lb (75.8 kg)  11/25/17 165 lb (74.8 kg)  05/27/17 166 lb (75.3 kg)     Physical Exam Constitutional: He appears well-developed and well-nourished. No distress.  HENT:  Head: Normocephalic and atraumatic.  Right Ear: External ear normal.  Left Ear: External ear normal.  Mouth/Throat: Oropharynx is clear and moist.  Normal ear canals and TM b/l  Eyes: Conjunctivae and EOM are normal.  Neck: Neck supple. No tracheal deviation present. No thyromegaly present.  No carotid bruit  Cardiovascular: Normal rate, regular rhythm, normal heart sounds and intact distal pulses.   No murmur heard. Pulmonary/Chest: Effort normal and breath sounds normal. No respiratory distress. He has no wheezes. He has no rales.  Abdominal: Soft. He exhibits no distension. There is no tenderness.  Genitourinary: deferred  Musculoskeletal: He exhibits no edema.  Lymphadenopathy:   He has no cervical adenopathy.  Skin: Skin is warm and dry. He is not diaphoretic.  Psychiatric: He has a normal mood and affect. His behavior is normal.         Assessment & Plan:   Wellness Exam: Immunizations  discussed shingrix, others up to date Colonoscopy   Aged out Eye exam   Up to date  Hearing loss - yes - wearing hearing aids Memory concerns/difficulties    none Independent of ADLs   fully Stressed the importance of regular exercise   Patient received copy of preventative screening tests/immunizations recommended for the next 5-10 years.    See Problem List for Assessment and Plan of chronic medical problems.  FU annually

## 2018-01-28 ENCOUNTER — Other Ambulatory Visit (INDEPENDENT_AMBULATORY_CARE_PROVIDER_SITE_OTHER): Payer: Medicare Other

## 2018-01-28 ENCOUNTER — Encounter: Payer: Self-pay | Admitting: Internal Medicine

## 2018-01-28 ENCOUNTER — Ambulatory Visit (INDEPENDENT_AMBULATORY_CARE_PROVIDER_SITE_OTHER): Payer: Medicare Other | Admitting: Internal Medicine

## 2018-01-28 VITALS — BP 138/70 | HR 75 | Temp 97.6°F | Resp 16 | Ht 71.0 in | Wt 167.0 lb

## 2018-01-28 DIAGNOSIS — Z0000 Encounter for general adult medical examination without abnormal findings: Secondary | ICD-10-CM | POA: Diagnosis not present

## 2018-01-28 DIAGNOSIS — K219 Gastro-esophageal reflux disease without esophagitis: Secondary | ICD-10-CM

## 2018-01-28 DIAGNOSIS — E782 Mixed hyperlipidemia: Secondary | ICD-10-CM | POA: Diagnosis not present

## 2018-01-28 DIAGNOSIS — R7303 Prediabetes: Secondary | ICD-10-CM | POA: Diagnosis not present

## 2018-01-28 DIAGNOSIS — R972 Elevated prostate specific antigen [PSA]: Secondary | ICD-10-CM | POA: Diagnosis not present

## 2018-01-28 DIAGNOSIS — I700 Atherosclerosis of aorta: Secondary | ICD-10-CM | POA: Diagnosis not present

## 2018-01-28 DIAGNOSIS — N189 Chronic kidney disease, unspecified: Secondary | ICD-10-CM | POA: Diagnosis not present

## 2018-01-28 LAB — CBC WITH DIFFERENTIAL/PLATELET
BASOS ABS: 0.1 10*3/uL (ref 0.0–0.1)
Basophils Relative: 0.8 % (ref 0.0–3.0)
EOS PCT: 5.4 % — AB (ref 0.0–5.0)
Eosinophils Absolute: 0.4 10*3/uL (ref 0.0–0.7)
HEMATOCRIT: 39.1 % (ref 39.0–52.0)
Hemoglobin: 13.9 g/dL (ref 13.0–17.0)
LYMPHS PCT: 18.5 % (ref 12.0–46.0)
Lymphs Abs: 1.2 10*3/uL (ref 0.7–4.0)
MCHC: 35.5 g/dL (ref 30.0–36.0)
MCV: 97.7 fl (ref 78.0–100.0)
MONOS PCT: 10.7 % (ref 3.0–12.0)
Monocytes Absolute: 0.7 10*3/uL (ref 0.1–1.0)
NEUTROS ABS: 4.2 10*3/uL (ref 1.4–7.7)
Neutrophils Relative %: 64.6 % (ref 43.0–77.0)
Platelets: 174 10*3/uL (ref 150.0–400.0)
RBC: 4.01 Mil/uL — AB (ref 4.22–5.81)
RDW: 13.2 % (ref 11.5–15.5)
WBC: 6.6 10*3/uL (ref 4.0–10.5)

## 2018-01-28 LAB — LIPID PANEL
CHOL/HDL RATIO: 2
Cholesterol: 85 mg/dL (ref 0–200)
HDL: 46.9 mg/dL (ref >39.00)
LDL CALC: 28 mg/dL (ref 0–99)
NONHDL: 38
Triglycerides: 48 mg/dL (ref 0.0–149.0)
VLDL: 9.6 mg/dL (ref 0.0–40.0)

## 2018-01-28 LAB — COMPREHENSIVE METABOLIC PANEL
ALT: 14 U/L (ref 0–53)
AST: 21 U/L (ref 0–37)
Albumin: 4 g/dL (ref 3.5–5.2)
Alkaline Phosphatase: 31 U/L — ABNORMAL LOW (ref 39–117)
BUN: 17 mg/dL (ref 6–23)
CHLORIDE: 108 meq/L (ref 96–112)
CO2: 28 mEq/L (ref 19–32)
Calcium: 9.1 mg/dL (ref 8.4–10.5)
Creatinine, Ser: 1.15 mg/dL (ref 0.40–1.50)
GFR: 65.1 mL/min (ref >60.00)
Glucose, Bld: 146 mg/dL — ABNORMAL HIGH (ref 70–99)
POTASSIUM: 4.7 meq/L (ref 3.5–5.1)
Sodium: 142 mEq/L (ref 135–145)
Total Bilirubin: 1.2 mg/dL (ref 0.2–1.2)
Total Protein: 6.5 g/dL (ref 6.0–8.3)

## 2018-01-28 LAB — HEMOGLOBIN A1C: HEMOGLOBIN A1C: 6.4 % (ref 4.6–6.5)

## 2018-01-28 NOTE — Assessment & Plan Note (Signed)
GERD controlled Continue daily medication  

## 2018-01-28 NOTE — Assessment & Plan Note (Signed)
Minimal - barely even reduced cmp

## 2018-01-28 NOTE — Assessment & Plan Note (Signed)
Check a1c Low sugar / carb diet Stressed regular exercise   

## 2018-01-28 NOTE — Patient Instructions (Addendum)
[name] , Thank you for taking time to come for your Medicare Wellness Visit. I appreciate your ongoing commitment to your health goals. Please review the following plan we discussed and let me know if I can assist you in the future.   These are the goals we discussed: Goals    Decease sugar intake      This is a list of the screening recommended for you and due dates:  Health Maintenance  Topic Date Due  . Flu Shot  01/22/2018  . Tetanus Vaccine  07/01/2024  . Pneumonia vaccines  Completed     Think about getting hte shingles vaccine.  Test(s) ordered today. Your results will be released to [place] (or called to you) after review, usually within 72hours after test completion. If any changes need to be made, you will be notified at that same time.  All other Health Maintenance issues reviewed.   All recommended immunizations and age-appropriate screenings are up-to-date or discussed.  No immunizations administered today.   Medications reviewed and updated.  No changes recommended at this time.    Please followup in one year    Health Maintenance, Male A healthy lifestyle and preventive care is important for your health and wellness. Ask your health care provider about what schedule of regular examinations is right for you. What should I know about weight and diet? Eat a Healthy Diet  Eat plenty of vegetables, fruits, whole grains, low-fat dairy products, and lean protein.  Do not eat a lot of foods high in solid fats, added sugars, or salt.  Maintain a Healthy Weight Regular exercise can help you achieve or maintain a healthy weight. You should:  Do at least 150 minutes of exercise each week. The exercise should increase your heart rate and make you sweat (moderate-intensity exercise).  Do strength-training exercises at least twice a week.  Watch Your Levels of Cholesterol and Blood Lipids  Have your blood tested for lipids and cholesterol every 5 years  starting at 79 years of age. If you are at high risk for heart disease, you should start having your blood tested when you are 79 years old. You may need to have your cholesterol levels checked more often if: ? Your lipid or cholesterol levels are high. ? You are older than 79 years of age. ? You are at high risk for heart disease.  What should I know about cancer screening? Many types of cancers can be detected early and may often be prevented. Lung Cancer  You should be screened every year for lung cancer if: ? You are a current smoker who has smoked for at least 30 years. ? You are a former smoker who has quit within the past 15 years.  Talk to your health care provider about your screening options, when you should start screening, and how often you should be screened.  Colorectal Cancer  Routine colorectal cancer screening usually begins at 79 years of age and should be repeated every 5-10 years until you are 79 years old. You may need to be screened more often if early forms of precancerous polyps or small growths are found. Your health care provider may recommend screening at an earlier age if you have risk factors for colon cancer.  Your health care provider may recommend using home test kits to check for hidden blood in the stool.  A small camera at the end of a tube can be used to examine your colon (sigmoidoscopy or colonoscopy). This checks  for the earliest forms of colorectal cancer.  Prostate and Testicular Cancer  Depending on your age and overall health, your health care provider may do certain tests to screen for prostate and testicular cancer.  Talk to your health care provider about any symptoms or concerns you have about testicular or prostate cancer.  Skin Cancer  Check your skin from head to toe regularly.  Tell your health care provider about any new moles or changes in moles, especially if: ? There is a change in a mole's size, shape, or color. ? You have a  mole that is larger than a pencil eraser.  Always use sunscreen. Apply sunscreen liberally and repeat throughout the day.  Protect yourself by wearing long sleeves, pants, a wide-brimmed hat, and sunglasses when outside.  What should I know about heart disease, diabetes, and high blood pressure?  If you are 27-6 years of age, have your blood pressure checked every 3-5 years. If you are 33 years of age or older, have your blood pressure checked every year. You should have your blood pressure measured twice-once when you are at a hospital or clinic, and once when you are not at a hospital or clinic. Record the average of the two measurements. To check your blood pressure when you are not at a hospital or clinic, you can use: ? An automated blood pressure machine at a pharmacy. ? A home blood pressure monitor.  Talk to your health care provider about your target blood pressure.  If you are between 37-5 years old, ask your health care provider if you should take aspirin to prevent heart disease.  Have regular diabetes screenings by checking your fasting blood sugar level. ? If you are at a normal weight and have a low risk for diabetes, have this test once every three years after the age of 50. ? If you are overweight and have a high risk for diabetes, consider being tested at a younger age or more often.  A one-time screening for abdominal aortic aneurysm (AAA) by ultrasound is recommended for men aged 62-75 years who are current or former smokers. What should I know about preventing infection? Hepatitis B If you have a higher risk for hepatitis B, you should be screened for this virus. Talk with your health care provider to find out if you are at risk for hepatitis B infection. Hepatitis C Blood testing is recommended for:  Everyone born from [birth year] through 1967.  Anyone with known risk factors for hepatitis C.  Sexually Transmitted Diseases (STDs)  You should be screened each year for  STDs including gonorrhea and chlamydia if: ? You are sexually active and are younger than 79 years of age. ? You are older than 79 years of age and your health care provider tells you that you are at risk for this type of infection. ? Your sexual activity has changed since you were last screened and you are at an increased risk for chlamydia or gonorrhea. Ask your health care provider if you are at risk.  Talk with your health care provider about whether you are at high risk of being infected with HIV. Your health care provider may recommend a prescription medicine to help prevent HIV infection.  What else can I do?  Schedule regular health, dental, and eye exams.  Stay current with your vaccines (immunizations).  Do not use any tobacco products, such as cigarettes, chewing tobacco, and e-cigarettes. If you need help quitting, ask your health care provider.  Limit alcohol intake to no more than 2 drinks per day. One drink equals 12 ounces of beer, 5 ounces of wine, or 1 ounces of hard liquor.  Do not use street drugs.  Do not share needles.  Ask your health care provider for help if you need support or information about quitting drugs.  Tell your health care provider if you often feel depressed.  Tell your health care provider if you have ever been abused or do not feel safe at home. This information is not intended to replace advice given to you by your health care provider. Make sure you discuss any questions you have with your health care provider. Document Released: 12/07/2007 Document Revised: 02/07/2016 Document Reviewed: 03/14/2015 Elsevier Interactive Patient Education  [employer].

## 2018-01-28 NOTE — Assessment & Plan Note (Addendum)
Check lipid panel  Continue daily statin-we will consider decreasing Crestor to 5 mg depending on blood work Regular exercise and healthy diet encouraged

## 2018-01-28 NOTE — Assessment & Plan Note (Signed)
Following with urology - Dr Amalia Hailey

## 2018-01-28 NOTE — Assessment & Plan Note (Addendum)
Seen on Ct scan 01/2017  - with coronary artery plaque Continue crestor-we will consider if we should continue 10 mg daily or 5 mg Lipid, cmp

## 2018-01-29 MED ORDER — ROSUVASTATIN CALCIUM 5 MG PO TABS: 5.0000 mg | ORAL_TABLET | Freq: Every day | ORAL | 3 refills | Status: DC

## 2018-01-29 NOTE — Telephone Encounter (Signed)
Are you okay with this change?

## 2018-02-02 ENCOUNTER — Other Ambulatory Visit: Payer: Self-pay | Admitting: Internal Medicine

## 2018-02-02 DIAGNOSIS — J8410 Pulmonary fibrosis, unspecified: Secondary | ICD-10-CM

## 2018-02-10 DIAGNOSIS — H26492 Other secondary cataract, left eye: Secondary | ICD-10-CM | POA: Diagnosis not present

## 2018-02-10 DIAGNOSIS — H18413 Arcus senilis, bilateral: Secondary | ICD-10-CM | POA: Diagnosis not present

## 2018-02-10 DIAGNOSIS — H26491 Other secondary cataract, right eye: Secondary | ICD-10-CM | POA: Diagnosis not present

## 2018-02-10 DIAGNOSIS — H02831 Dermatochalasis of right upper eyelid: Secondary | ICD-10-CM | POA: Diagnosis not present

## 2018-02-17 DIAGNOSIS — Z9842 Cataract extraction status, left eye: Secondary | ICD-10-CM | POA: Diagnosis not present

## 2018-02-24 DIAGNOSIS — H26491 Other secondary cataract, right eye: Secondary | ICD-10-CM | POA: Diagnosis not present

## 2018-03-03 DIAGNOSIS — H52223 Regular astigmatism, bilateral: Secondary | ICD-10-CM | POA: Diagnosis not present

## 2018-03-03 DIAGNOSIS — Z9849 Cataract extraction status, unspecified eye: Secondary | ICD-10-CM | POA: Diagnosis not present

## 2018-03-03 DIAGNOSIS — H524 Presbyopia: Secondary | ICD-10-CM | POA: Diagnosis not present

## 2018-03-03 DIAGNOSIS — H5203 Hypermetropia, bilateral: Secondary | ICD-10-CM | POA: Diagnosis not present

## 2018-03-20 DIAGNOSIS — Z23 Encounter for immunization: Secondary | ICD-10-CM | POA: Diagnosis not present

## 2018-03-23 DIAGNOSIS — L814 Other melanin hyperpigmentation: Secondary | ICD-10-CM | POA: Diagnosis not present

## 2018-03-23 DIAGNOSIS — D1801 Hemangioma of skin and subcutaneous tissue: Secondary | ICD-10-CM | POA: Diagnosis not present

## 2018-03-23 DIAGNOSIS — L570 Actinic keratosis: Secondary | ICD-10-CM | POA: Diagnosis not present

## 2018-03-23 DIAGNOSIS — L821 Other seborrheic keratosis: Secondary | ICD-10-CM | POA: Diagnosis not present

## 2018-03-23 DIAGNOSIS — L400 Psoriasis vulgaris: Secondary | ICD-10-CM | POA: Diagnosis not present

## 2018-03-23 DIAGNOSIS — D692 Other nonthrombocytopenic purpura: Secondary | ICD-10-CM | POA: Diagnosis not present

## 2018-04-08 ENCOUNTER — Other Ambulatory Visit: Payer: Self-pay | Admitting: Internal Medicine

## 2018-06-01 ENCOUNTER — Other Ambulatory Visit: Payer: Self-pay | Admitting: Internal Medicine

## 2018-06-01 DIAGNOSIS — J8410 Pulmonary fibrosis, unspecified: Secondary | ICD-10-CM

## 2018-06-01 MED ORDER — FAMOTIDINE 20 MG PO TABS: ORAL_TABLET | ORAL | 1 refills | Status: DC

## 2018-06-01 MED ORDER — PANTOPRAZOLE SODIUM 40 MG PO TBEC: DELAYED_RELEASE_TABLET | ORAL | 1 refills | Status: DC

## 2018-08-02 NOTE — Patient Instructions (Addendum)
  Tests ordered today. Your results will be released to MyChart (or called to you) after review, usually within 72hours after test completion. If any changes need to be made, you will be notified at that same time.  Medications reviewed and updated.  Changes include :   none      Please followup in 6 months   

## 2018-08-02 NOTE — Progress Notes (Signed)
Subjective:    Patient ID: Walter Hess, male    DOB: [birth year], 78 y.o.   MRN: [id number]  HPI The patient is here for follow up.  Prediabetes:  He is compliant with a low sugar/carbohydrate diet.  He is exercising regularly - 6 days a week.  Hyperlipidemia: He is taking his medication daily. He is compliant with a low fat/cholesterol diet. He is exercising regularly. He denies myalgias.   GERD:  He is taking his medication daily as prescribed.  He denies any GERD symptoms and feels his GERD is well controlled.   CKD:  He drinks a fair amount of water, but could drink more.   He rarely takes nsaids and takes minimal amount.    Medications and allergies reviewed with patient and updated if appropriate.  Patient Active Problem List   Diagnosis Date Noted  . Aortic atherosclerosis ([place]) 01/28/2018  . Decreased hearing 01/27/2017  . GERD (gastroesophageal reflux disease) 01/26/2017  . Cough 06/27/2016  . Sinusitis, chronic 02/29/2016  . Postinflammatory pulmonary fibrosis ([place]) 02/29/2016  . Chronic kidney disease 01/30/2016  . Lung nodule 01/28/2016  . Prediabetes 01/25/2016  . Vertebral basilar insufficiency 01/25/2016  . Abnormal chest x-ray 01/25/2016  . DUPUYTREN'S CONTRACTURE, RIGHT 10/05/2009  . History of anemia 09/22/2008  . HYPERLIPIDEMIA 06/24/2007  . DIVERTICULOSIS, COLON 06/24/2007  . PROSTATE SPECIFIC ANTIGEN( PSA), ELEVATED 06/24/2007  . NONSPECIFIC ABNORMAL ELECTROCARDIOGRAM 06/24/2007    Current Outpatient Medications on File Prior to Visit  Medication Sig Dispense Refill  . aspirin 81 MG tablet Take 81 mg by mouth daily.      . Coenzyme Q10 (COQ10) 100 MG CAPS Take by mouth daily.    . famotidine (PEPCID) 20 MG tablet TAKE 1 TABLET AT BEDTIME 90 tablet 1  . Multiple Vitamin (MULTIVITAMIN) tablet Take 1 tablet by mouth daily.      . pantoprazole (PROTONIX) 40 MG tablet TAKE 1 TABLET EVERY DAY 30-60 MINUTES BEFORE THE 1ST MEAL OF THE DAY 90 tablet 1    . rosuvastatin (CRESTOR) 5 MG tablet Take 1 tablet (5 mg total) by mouth daily. 90 tablet 3  . S-Adenosylmethionine (SAM-E) 200 MG TABS Take by mouth daily.      . Saw Palmetto 450 MG CAPS Take by mouth daily.      . Turmeric 500 MG CAPS Take by mouth daily.       No current facility-administered medications on file prior to visit.     Past Medical History:  Diagnosis Date  . Anemia    former blood donor for decades; stopped 2006  . Diverticulosis 2005   Dr. Olevia Perches  . Elevated prostate specific antigen (PSA)    Dr. Amalia Hailey [facility]  . Gilbert's syndrome   . Hyperlipidemia     Past Surgical History:  Procedure Laterality Date  . CATARACT EXTRACTION, BILATERAL  2017  . COLONOSCOPY  2005   Diverticulosis, Dr.Brodie.  Marland Kitchen GANGLION CYST EXCISION  2005   5th L digit  . LUMBAR LAMINECTOMY  1981    Social History   Socioeconomic History  . Marital status: Married    Spouse name: Not on file  . Number of children: Not on file  . Years of education: Not on file  . Highest education level: Not on file  Occupational History  . Not on file  Social Needs  . Financial resource strain: Not on file  . Food insecurity:    Worry: Not on file    Inability: Not  on file  . Transportation needs:    Medical: Not on file    Non-medical: Not on file  Tobacco Use  . Smoking status: Former Smoker    Packs/day: 0.50    Years: 5.00    Pack years: 2.50    Last attempt to quit: 06/25/1963    Years since quitting: 55.1  . Smokeless tobacco: Never Used  Substance and Sexual Activity  . Alcohol use: Yes    Alcohol/week: 0.0 standard drinks    Comment: beer occasionally  . Drug use: No  . Sexual activity: Not on file  Lifestyle  . Physical activity:    Days per week: Not on file    Minutes per session: Not on file  . Stress: Not on file  Relationships  . Social connections:    Talks on phone: Not on file    Gets together: Not on file    Attends religious service: Not on file    Active  member of club or organization: Not on file    Attends meetings of clubs or organizations: Not on file    Relationship status: Not on file  Other Topics Concern  . Not on file  Social History Narrative  . Not on file    Family History  Problem Relation Age of Onset  . Heart attack Father 82       former smoker  . Alzheimer's disease Mother   . Coronary artery disease Sister        open heart surgery  . Diabetes Paternal Aunt   . Heart attack Paternal Uncle 92  . Coronary artery disease Paternal Grandfather   . Prostate cancer Paternal Grandfather   . Prostate cancer Maternal Uncle   . Stroke Neg Hx   . Colon cancer Neg Hx     Review of Systems  Constitutional: Negative for chills and fever.  Respiratory: Negative for cough, shortness of breath and wheezing.   Cardiovascular: Negative for chest pain, palpitations and leg swelling.  Neurological: Negative for light-headedness and headaches.       Objective:   Vitals:   08/03/18 0812  BP: (!) 144/76  Pulse: 78  Resp: 16  Temp: 98.4 F (36.9 C)  SpO2: 99%   BP Readings from Last 3 Encounters:  08/03/18 (!) 144/76  01/28/18 138/70  11/25/17 130/74   Wt Readings from Last 3 Encounters:  08/03/18 162 lb 12.8 oz (73.8 kg)  01/28/18 167 lb (75.8 kg)  11/25/17 165 lb (74.8 kg)   Body mass index is 22.71 kg/m.   Physical Exam    Constitutional: Appears well-developed and well-nourished. No distress.  HENT:  Head: Normocephalic and atraumatic.  Neck: Neck supple. No tracheal deviation present. No thyromegaly present.  No cervical lymphadenopathy Cardiovascular: Normal rate, regular rhythm and normal heart sounds.   No murmur heard. No carotid bruit .  No edema Pulmonary/Chest: Effort normal and breath sounds normal. No respiratory distress. No has no wheezes. No rales.  Skin: Skin is warm and dry. Not diaphoretic.  Psychiatric: Normal mood and affect. Behavior is normal.      Assessment & Plan:    See  Problem List for Assessment and Plan of chronic medical problems.

## 2018-08-03 ENCOUNTER — Other Ambulatory Visit (INDEPENDENT_AMBULATORY_CARE_PROVIDER_SITE_OTHER): Payer: Medicare Other

## 2018-08-03 ENCOUNTER — Encounter: Payer: Self-pay | Admitting: Internal Medicine

## 2018-08-03 ENCOUNTER — Ambulatory Visit (INDEPENDENT_AMBULATORY_CARE_PROVIDER_SITE_OTHER): Payer: Medicare Other | Admitting: Internal Medicine

## 2018-08-03 VITALS — BP 144/76 | HR 78 | Temp 98.4°F | Resp 16 | Ht 71.0 in | Wt 162.8 lb

## 2018-08-03 DIAGNOSIS — N189 Chronic kidney disease, unspecified: Secondary | ICD-10-CM

## 2018-08-03 DIAGNOSIS — R7303 Prediabetes: Secondary | ICD-10-CM

## 2018-08-03 DIAGNOSIS — K219 Gastro-esophageal reflux disease without esophagitis: Secondary | ICD-10-CM | POA: Diagnosis not present

## 2018-08-03 DIAGNOSIS — E782 Mixed hyperlipidemia: Secondary | ICD-10-CM

## 2018-08-03 LAB — LIPID PANEL
CHOL/HDL RATIO: 2
Cholesterol: 99 mg/dL (ref 0–200)
HDL: 43.4 mg/dL (ref >39.00)
LDL Cholesterol: 44 mg/dL (ref 0–99)
NonHDL: 55.1
Triglycerides: 56 mg/dL (ref 0.0–149.0)
VLDL: 11.2 mg/dL (ref 0.0–40.0)

## 2018-08-03 LAB — COMPREHENSIVE METABOLIC PANEL
ALT: 12 U/L (ref 0–53)
AST: 20 U/L (ref 0–37)
Albumin: 4.1 g/dL (ref 3.5–5.2)
Alkaline Phosphatase: 38 U/L — ABNORMAL LOW (ref 39–117)
BUN: 17 mg/dL (ref 6–23)
CO2: 29 meq/L (ref 19–32)
Calcium: 9.1 mg/dL (ref 8.4–10.5)
Chloride: 105 mEq/L (ref 96–112)
Creatinine, Ser: 1.13 mg/dL (ref 0.40–1.50)
GFR: 62.42 mL/min (ref >60.00)
Glucose, Bld: 122 mg/dL — ABNORMAL HIGH (ref 70–99)
Potassium: 3.9 mEq/L (ref 3.5–5.1)
SODIUM: 142 meq/L (ref 135–145)
Total Bilirubin: 1.1 mg/dL (ref 0.2–1.2)
Total Protein: 6.7 g/dL (ref 6.0–8.3)

## 2018-08-03 LAB — HEMOGLOBIN A1C: Hgb A1c MFr Bld: 6.2 % (ref 4.6–6.5)

## 2018-08-03 NOTE — Assessment & Plan Note (Signed)
Avoids nsaids Drinks a good amount of water cmp

## 2018-08-03 NOTE — Assessment & Plan Note (Signed)
Check lipid panel  Continue daily statin Regular exercise and healthy diet encouraged  

## 2018-08-03 NOTE — Assessment & Plan Note (Signed)
GERD controlled Continue daily medication  

## 2018-08-03 NOTE — Assessment & Plan Note (Signed)
Check a1c Low sugar / carb diet Stressed regular exercise   

## 2018-08-05 ENCOUNTER — Encounter: Payer: Self-pay | Admitting: Internal Medicine

## 2018-09-21 DIAGNOSIS — L309 Dermatitis, unspecified: Secondary | ICD-10-CM | POA: Diagnosis not present

## 2018-09-21 DIAGNOSIS — D485 Neoplasm of uncertain behavior of skin: Secondary | ICD-10-CM | POA: Diagnosis not present

## 2018-09-21 DIAGNOSIS — L821 Other seborrheic keratosis: Secondary | ICD-10-CM | POA: Diagnosis not present

## 2018-09-21 DIAGNOSIS — D1801 Hemangioma of skin and subcutaneous tissue: Secondary | ICD-10-CM | POA: Diagnosis not present

## 2018-09-21 DIAGNOSIS — L570 Actinic keratosis: Secondary | ICD-10-CM | POA: Diagnosis not present

## 2018-11-19 ENCOUNTER — Other Ambulatory Visit: Payer: Self-pay | Admitting: Internal Medicine

## 2018-11-22 ENCOUNTER — Other Ambulatory Visit: Payer: Self-pay | Admitting: Internal Medicine

## 2018-11-22 DIAGNOSIS — J8410 Pulmonary fibrosis, unspecified: Secondary | ICD-10-CM

## 2018-11-23 ENCOUNTER — Other Ambulatory Visit: Payer: Self-pay | Admitting: Internal Medicine

## 2018-11-23 DIAGNOSIS — J8410 Pulmonary fibrosis, unspecified: Secondary | ICD-10-CM

## 2018-11-26 ENCOUNTER — Ambulatory Visit: Payer: Medicare Other | Admitting: Internal Medicine

## 2018-11-27 ENCOUNTER — Ambulatory Visit: Payer: Medicare Other | Admitting: Internal Medicine

## 2018-12-08 ENCOUNTER — Other Ambulatory Visit (HOSPITAL_COMMUNITY)
Admission: RE | Admit: 2018-12-08 | Discharge: 2018-12-08 | Disposition: A | Discharge status: Home / Self Care | Payer: Medicare Other | Source: Ambulatory Visit | Attending: Internal Medicine | Admitting: Internal Medicine

## 2018-12-08 DIAGNOSIS — Z1159 Encounter for screening for other viral diseases: Secondary | ICD-10-CM | POA: Diagnosis not present

## 2018-12-09 LAB — NOVEL CORONAVIRUS, NAA (HOSP ORDER, SEND-OUT TO REF LAB; TAT 18-24 HRS): SARS-CoV-2, NAA: NOT DETECTED

## 2018-12-09 NOTE — Progress Notes (Signed)
Left detailed msg on machine ok per DPR

## 2018-12-10 ENCOUNTER — Other Ambulatory Visit: Payer: Self-pay | Admitting: Internal Medicine

## 2018-12-10 DIAGNOSIS — J8410 Pulmonary fibrosis, unspecified: Secondary | ICD-10-CM

## 2018-12-10 DIAGNOSIS — R911 Solitary pulmonary nodule: Secondary | ICD-10-CM

## 2018-12-11 ENCOUNTER — Ambulatory Visit (INDEPENDENT_AMBULATORY_CARE_PROVIDER_SITE_OTHER): Payer: Medicare Other | Admitting: Internal Medicine

## 2018-12-11 ENCOUNTER — Other Ambulatory Visit: Payer: Self-pay

## 2018-12-11 ENCOUNTER — Encounter: Payer: Self-pay | Admitting: Internal Medicine

## 2018-12-11 DIAGNOSIS — J8410 Pulmonary fibrosis, unspecified: Secondary | ICD-10-CM

## 2018-12-11 DIAGNOSIS — R911 Solitary pulmonary nodule: Secondary | ICD-10-CM

## 2018-12-11 LAB — PULMONARY FUNCTION TEST
DL/VA % pred: 73 %
DL/VA: 2.84 ml/min/mmHg/L
DLCO unc % pred: 60 %
DLCO unc: 14.91 ml/min/mmHg
FEF 25-75 Post: 2.59 L/sec
FEF 25-75 Pre: 2.37 L/sec
FEF2575-%Change-Post: 9 %
FEF2575-%Pred-Post: 127 %
FEF2575-%Pred-Pre: 117 %
FEV1-%Change-Post: 2 %
FEV1-%Pred-Post: 101 %
FEV1-%Pred-Pre: 98 %
FEV1-Post: 2.96 L
FEV1-Pre: 2.89 L
FEV1FVC-%Change-Post: 2 %
FEV1FVC-%Pred-Pre: 107 %
FEV6-%Change-Post: 0 %
FEV6-%Pred-Post: 96 %
FEV6-%Pred-Pre: 96 %
FEV6-Post: 3.7 L
FEV6-Pre: 3.71 L
FEV6FVC-%Change-Post: 0 %
FEV6FVC-%Pred-Post: 106 %
FEV6FVC-%Pred-Pre: 106 %
FVC-%Change-Post: 0 %
FVC-%Pred-Post: 90 %
FVC-%Pred-Pre: 91 %
FVC-Post: 3.73 L
FVC-Pre: 3.75 L
Post FEV1/FVC ratio: 79 %
Post FEV6/FVC ratio: 99 %
Pre FEV1/FVC ratio: 77 %
Pre FEV6/FVC Ratio: 99 %
RV % pred: 50 %
RV: 1.34 L
TLC % pred: 69 %
TLC: 4.98 L

## 2018-12-11 NOTE — Progress Notes (Signed)
PFT done today. 

## 2018-12-11 NOTE — Progress Notes (Signed)
Subjective:     Patient ID: Walter Hess, male   DOB: 1940/07/25     MRN: [id number]    Brief patient profile:  53 yowm never regular smoker stopped completely  in his 20's and no lower resp problems just sinus symptoms starting in 40s with recurrent infections  but no need for  specialty eval or need for rx then routine cxr suggested lung nodule in 01/2015 repeat in 01/2016 suggested PF so referred to pulmonary clinic 02/29/2016 by Dr  Quay Burow     History of Present Illness  02/29/2016 1st [facility] office visit/ Walter Hess   Chief Complaint  Patient presents with  . Pulmonary Consult    Referred by Dr. Billey Gosling for abnormal cxr. Pt denies any respiratory co's today.    6 days a week does 5 degrees of elevation at 3-4 mph x 25 min s sob x 40 year pattern s change rec Stay as active as you can and let me know if you are  losing ground with exercise or your oxygen saturation is dropping below your baseline   - above 90% is normal - pulse oximeter is the name of the instrument    05/30/2016  f/u ov/Walter Hess re: pf/ no change ex tol  Chief Complaint  Patient presents with  . Follow-up    Breathing is unchanged. No new co's today. PFT's done.   with peak exercise on his home treamill no limiting sob and  never less than 94% sats rec Pantoprazole (protonix) 40 mg   Take  30-60 min before first meal of the day and Pepcid (famotidine)  20 mg one @  bedtime until return to office - this is the best way to tell whether stomach acid is contributing to your problem.  Stop fish oil and eat more fish  GERD diet    06/27/2016 acute extended ov/Walter Hess re: acute cough in pt with underlying PF maint rx ppi/h2hs  Chief Complaint  Patient presents with  . Acute Visit    Pt c/o cough and chest congestion- onset 5 days ago.  He has had some dark colored nasal d/c.    cc chronic nasal congestion for which he uses nettipot nightly x years but then much worse  Abruptly x one week prior to OV   then started  with cough 2 days p flare of nasal symptoms with nasal secrtions and sputum turning dark grey/green but no fever or sob   rec For cough > mucinex dm up to 1200 mg every 12 hours as needed  Augmentin 875 mg take one pill twice daily  X 10 days - take at breakfast and supper with large glass of water.  It would help reduce the usual side effects (diarrhea and yeast infections) if you ate cultured yogurt at lunch.  Please remember to go to the  x-ray department downstairs for your tests - we will call you with the results when they are available.   11/25/2016  f/u ov/Walter Hess re:  PF / cough resolved on gerd rx  Chief Complaint  Patient presents with  . Follow-up    Cough has resolved. No new co's.    6 days per week, treadmill at 5 degrees x 4.5 mph x 25 min with sats 92% and no change ex tol or sats  No cough at all  rec No change in acid suppression  Please see patient coordinator before you leave today  to schedule HRCT in 2 months    05/27/2017  f/u ov/Walter Hess re: PF ? UIP  Chief Complaint  Patient presents with  . Follow-up    Breathing is doing well and no new co's.   same level of ex/ no change in sats  Hoarse but no coughing  rec  No change rx      11/25/2017  f/u ov/Walter Hess re: PF  Non dx HRCT only rx is for gerd Chief Complaint  Patient presents with  . Follow-up    Breathing is unchanged since last OV. Pt does not have any complaints today. PFT was performed.  Dyspnea:  No change in sats with ex  Cough: sporadic, not with ex Sleep: fine rec Continue your exercise program and let me know if you note any decline in 02 sats with exercise over time    12/11/2018  f/u ov/Walter Hess re:  PF/ nondx hrct maint  Chief Complaint  Patient presents with  . Follow-up    PFT's done today. He has occ cough and hoarseness.   Dyspnea:  Treadmill x 73mph x 5 degrees x 25 min x 6 days a week and 02 sats low 90s Cough: some drainage minimal / assoc nasal obst byers already eval  Sleeping: fine x 4  in hob  SABA use: none 02: none    No obvious day to day or daytime variability or assoc excess/ purulent sputum or mucus plugs or hemoptysis or cp or chest tightness, subjective wheeze or overt sinus or hb symptoms.   Sleeping  without nocturnal  or early am exacerbation  of respiratory  c/o's or need for noct saba. Also denies any obvious fluctuation of symptoms with weather or environmental changes or other aggravating or alleviating factors except as outlined above   No unusual exposure hx or h/o childhood pna/ asthma or knowledge of premature birth.  Current Allergies, Complete Past Medical History, Past Surgical History, Family History, and Social History were reviewed in [employer] record.  ROS  The following are not active complaints unless bolded Hoarseness, sore throat, dysphagia, dental problems, itching, sneezing,  nasal congestion or discharge of excess mucus or purulent secretions, ear ache,   fever, chills, sweats, unintended wt loss or wt gain, classically pleuritic or exertional cp,  orthopnea pnd or arm/hand swelling  or leg swelling, presyncope, palpitations, abdominal pain, anorexia, nausea, vomiting, diarrhea  or change in bowel habits or change in bladder habits, change in stools or change in urine, dysuria, hematuria,  rash, arthralgias, visual complaints, headache, numbness, weakness or ataxia or problems with walking or coordination,  change in mood or  memory.        Current Meds  Medication Sig  . aspirin 81 MG tablet Take 81 mg by mouth daily.    . Coenzyme Q10 (COQ10) 100 MG CAPS Take by mouth daily.  . famotidine (PEPCID) 20 MG tablet TAKE 1 TABLET BY MOUTH EVERYDAY AT BEDTIME  . Multiple Vitamin (MULTIVITAMIN) tablet Take 1 tablet by mouth daily.    . pantoprazole (PROTONIX) 40 MG tablet TAKE 1 TABLET EVERY DAY 30-60 MINUTES BEFORE THE 1ST MEAL OF THE DAY  . rosuvastatin (CRESTOR) 5 MG tablet Take 1 tablet (5 mg total) by mouth daily.  .  S-Adenosylmethionine (SAM-E) 200 MG TABS Take by mouth daily.    . Saw Palmetto 450 MG CAPS Take by mouth daily.    . Turmeric 500 MG CAPS Take by mouth daily.  Objective:   Physical Exam    12/11/2018        160 11/25/2017          165  05/27/2017        166  11/25/2016          167  06/27/2016          163 05/30/2016        168   02/29/16 169 lb 12.8 oz (77 kg)  01/25/16 160 lb 8 oz (72.8 kg)  01/23/15 170 lb (77.1 kg)    Vital signs reviewed - Note on arrival 02 sats  100% on RA       distant crackles on insp  bilaterally  R> L without cough on insp or exp maneuvers  HEENT: nl dentition, turbinates bilaterally, and oropharynx. Nl external ear canals without cough reflex   NECK :  without JVD/Nodes/TM/ nl carotid upstrokes bilaterally   LUNGS: no acc muscle use,  Nl contour chest with basilar insp crackles bilaterally R > L without cough on insp or exp maneuvers   CV:  RRR  no s3 or murmur or increase in P2, and no edema   ABD:  soft and nontender with nl inspiratory excursion in the supine position. No bruits or organomegaly appreciated, bowel sounds nl  MS:  Nl gait/ ext warm without deformities, calf tenderness, cyanosis or clubbing No obvious joint restrictions   SKIN: warm and dry without lesions    NEURO:  alert, approp, nl sensorium with  no motor or cerebellar deficits apparent.           Assessment:

## 2018-12-11 NOTE — Patient Instructions (Addendum)
Continue your exercise program and let me know if you note any decline in 02 sats with exercise over time   Call Dr Janace Hoard if nasal symptoms worsens  Follow up here can be as needed

## 2018-12-12 ENCOUNTER — Encounter: Payer: Self-pay | Admitting: Internal Medicine

## 2018-12-12 NOTE — Assessment & Plan Note (Addendum)
Incidental finding/ pt asymptomatic Ct scan 01/2016:  No suspicious pulmonary nodules. Changes of pulmonary fibrosis, most pronounced in the mid and lower lung zones. Borderline sized mediastinal lymph nodes, likely reactive. - PFT's  05/30/2016  FEV1 3.07 (101 % ) ratio 81  p 2 % improvement from saba p nothing  prior to study with DLCO  54/59 % corrects to 76 % for alv volume   - HRCT  01/24/2017 1. There is a spectrum of findings compatible with interstitial lung disease, an although the overall appearance is considered a "probable UIP CT pattern", the lack of progression compared to the prior study, lack of frank honeycombing and presence of air trapping raises the possibility of fibrotic phase nonspecific interstitial pneumonia (NSIP). Repeat high-resolution chest CT is suggested in 12 months to assess for temporal changes in the appearance of the lung parenchyma. 2. Aortic atherosclerosis, in addition to three-vessel coronary artery disease. - PFT's  11/25/2017   FVC 3.80 (91%) with DLCO  57 % corrects to 78 % for alv volume   - PFT's  12/11/2018 FVC 3.75 (91%)   with DLCO  60 % corrects to 73  % for alv volume     I had an extended final summary discussion with the patient reviewing all relevant studies completed to date and  lasting 15 to 20 minutes of a 25 minute visit on the following issues:   Use of PPI is associated with improved survival time and with decreased radiologic fibrosis per King's study published in AJRCCM vol 184 p1390.  Dec 2011 and also may have other beneficial effects as per the latest review in [place] vol 193 Z3299 Jun 20016.  This may not always be cause and effect, but given how universally underwhelming  (at least in terms of short term benefit)   and expensive  all the other  Drugs developed to day  have been for pf,   rec  continue ppi / diet/ lifestyle modification and f/u with serial walking sats indefinitely.     Pattern is very stable x one year and pt  monitoring with ex sats at home on treadmill so no need for pulmonary f/u at this point until downward trend develops.  Advised re seriousness of impact of covid given his pre-existing PF > social distancing/ facemasks strongly rec and no need for "regular office checkups" in this setting  Pulmonary f/u is prn

## 2019-01-07 ENCOUNTER — Ambulatory Visit: Payer: Medicare Other | Admitting: Internal Medicine

## 2019-01-12 ENCOUNTER — Other Ambulatory Visit: Payer: Self-pay | Admitting: Internal Medicine

## 2019-01-14 DIAGNOSIS — R3911 Hesitancy of micturition: Secondary | ICD-10-CM | POA: Diagnosis not present

## 2019-01-14 DIAGNOSIS — R972 Elevated prostate specific antigen [PSA]: Secondary | ICD-10-CM | POA: Diagnosis not present

## 2019-01-14 DIAGNOSIS — N401 Enlarged prostate with lower urinary tract symptoms: Secondary | ICD-10-CM | POA: Diagnosis not present

## 2019-01-30 NOTE — Patient Instructions (Addendum)
  Tests ordered today. Your results will be released to MyChart (or called to you) after review.  If any changes need to be made, you will be notified at that same time.    Medications reviewed and updated.  Changes include :   none     Please followup in 6 months   

## 2019-01-30 NOTE — Progress Notes (Signed)
Subjective:    Patient ID: Walter Hess, male    DOB: Mar 06, 1941, 78 y.o.   MRN: [id number]  HPI The patient is here for follow up.  He is exercising regularly.     BP at home in the 120's/?.  It tends to be elevated here.    Prediabetes:  He is more compliant with a low sugar/carbohydrate diet.  He is exercising regularly.  Hyperlipidemia: He is taking his medication daily. He is compliant with a low fat/cholesterol diet. He denies myalgias.   CKD:  He takes advil rarely.  He drinks water throughout the day.    Pulmonary fibrosis, GERD:  He is taking his medication daily as prescribed.  He denies any GERD symptoms - never had had GERD - takes for his lungs.        Medications and allergies reviewed with patient and updated if appropriate.  Patient Active Problem List   Diagnosis Date Noted  . Aortic atherosclerosis ([place]) 01/28/2018  . Decreased hearing 01/27/2017  . GERD (gastroesophageal reflux disease) 01/26/2017  . Cough 06/27/2016  . Sinusitis, chronic 02/29/2016  . Postinflammatory pulmonary fibrosis ([place]) 02/29/2016  . Chronic kidney disease 01/30/2016  . Lung nodule 01/28/2016  . Prediabetes 01/25/2016  . Vertebral basilar insufficiency 01/25/2016  . Abnormal chest x-ray 01/25/2016  . DUPUYTREN'S CONTRACTURE, RIGHT 10/05/2009  . HYPERLIPIDEMIA 06/24/2007  . DIVERTICULOSIS, COLON 06/24/2007  . PROSTATE SPECIFIC ANTIGEN( PSA), ELEVATED 06/24/2007  . NONSPECIFIC ABNORMAL ELECTROCARDIOGRAM 06/24/2007    Current Outpatient Medications on File Prior to Visit  Medication Sig Dispense Refill  . aspirin 81 MG tablet Take 81 mg by mouth daily.      . Coenzyme Q10 (COQ10) 100 MG CAPS Take by mouth daily.    . famotidine (PEPCID) 20 MG tablet TAKE 1 TABLET BY MOUTH EVERYDAY AT BEDTIME 90 tablet 0  . Multiple Vitamin (MULTIVITAMIN) tablet Take 1 tablet by mouth daily.      . pantoprazole (PROTONIX) 40 MG tablet TAKE 1 TABLET EVERY DAY 30-60 MINUTES BEFORE THE 1ST  MEAL OF THE DAY 90 tablet 0  . rosuvastatin (CRESTOR) 5 MG tablet TAKE 1 TABLET BY MOUTH EVERY DAY 90 tablet 1  . S-Adenosylmethionine (SAM-E) 200 MG TABS Take by mouth daily.      . Saw Palmetto 450 MG CAPS Take by mouth daily.      . Turmeric 500 MG CAPS Take by mouth daily.       No current facility-administered medications on file prior to visit.     Past Medical History:  Diagnosis Date  . Anemia    former blood donor for decades; stopped 2006  . Diverticulosis 2005   Dr. Olevia Perches  . Elevated prostate specific antigen (PSA)    Dr. Amalia Hailey [facility]  . Gilbert's syndrome   . Hyperlipidemia     Past Surgical History:  Procedure Laterality Date  . CATARACT EXTRACTION, BILATERAL  2017  . COLONOSCOPY  2005   Diverticulosis, Dr.Brodie.  Marland Kitchen GANGLION CYST EXCISION  2005   5th L digit  . LUMBAR LAMINECTOMY  1981    Social History   Socioeconomic History  . Marital status: Married    Spouse name: Not on file  . Number of children: Not on file  . Years of education: Not on file  . Highest education level: Not on file  Occupational History  . Not on file  Social Needs  . Financial resource strain: Not on file  . Food insecurity  Worry: Not on file    Inability: Not on file  . Transportation needs    Medical: Not on file    Non-medical: Not on file  Tobacco Use  . Smoking status: Former Smoker    Packs/day: 0.50    Years: 5.00    Pack years: 2.50    Quit date: 06/25/1963    Years since quitting: 55.6  . Smokeless tobacco: Never Used  Substance and Sexual Activity  . Alcohol use: Yes    Alcohol/week: 0.0 standard drinks    Comment: beer occasionally  . Drug use: No  . Sexual activity: Not on file  Lifestyle  . Physical activity    Days per week: Not on file    Minutes per session: Not on file  . Stress: Not on file  Relationships  . Social Herbalist on phone: Not on file    Gets together: Not on file    Attends religious service: Not on file    Active  member of club or organization: Not on file    Attends meetings of clubs or organizations: Not on file    Relationship status: Not on file  Other Topics Concern  . Not on file  Social History Narrative  . Not on file    Family History  Problem Relation Age of Onset  . Heart attack Father 54       former smoker  . Alzheimer's disease Mother   . Coronary artery disease Sister        open heart surgery  . Diabetes Paternal Aunt   . Heart attack Paternal Uncle 71  . Coronary artery disease Paternal Grandfather   . Prostate cancer Paternal Grandfather   . Prostate cancer Maternal Uncle   . Stroke Neg Hx   . Colon cancer Neg Hx     Review of Systems  Constitutional: Negative for chills and fever.  Respiratory: Negative for cough, shortness of breath and wheezing.   Cardiovascular: Negative for chest pain, palpitations and leg swelling.  Gastrointestinal: Negative for abdominal pain and nausea.  Neurological: Negative for dizziness, light-headedness and headaches.       Objective:   Vitals:   02/01/19 0821  BP: (!) 166/70  Pulse: (!) 52  Resp: 16  Temp: 98.1 F (36.7 C)  SpO2: 98%   BP Readings from Last 3 Encounters:  02/01/19 (!) 166/70  12/11/18 118/74  08/03/18 (!) 144/76   Wt Readings from Last 3 Encounters:  02/01/19 162 lb (73.5 kg)  12/11/18 160 lb (72.6 kg)  08/03/18 162 lb 12.8 oz (73.8 kg)   Body mass index is 23.58 kg/m.   Physical Exam    Constitutional: Appears well-developed and well-nourished. No distress.  HENT:  Head: Normocephalic and atraumatic.  Neck: Neck supple. No tracheal deviation present. No thyromegaly present.  No cervical lymphadenopathy Cardiovascular: Normal rate, regular rhythm and normal heart sounds.  No murmur heard. No carotid bruit .  No edema Pulmonary/Chest: Effort normal. No respiratory distress. No has no wheezes. Bibasilar crackles  Skin: Skin is warm and dry. Not diaphoretic.  Psychiatric: Normal mood and affect.  Behavior is normal.      Assessment & Plan:    See Problem List for Assessment and Plan of chronic medical problems.

## 2019-02-01 ENCOUNTER — Encounter: Payer: Self-pay | Admitting: Internal Medicine

## 2019-02-01 ENCOUNTER — Other Ambulatory Visit: Payer: Self-pay

## 2019-02-01 ENCOUNTER — Other Ambulatory Visit (INDEPENDENT_AMBULATORY_CARE_PROVIDER_SITE_OTHER): Payer: Medicare Other

## 2019-02-01 ENCOUNTER — Ambulatory Visit (INDEPENDENT_AMBULATORY_CARE_PROVIDER_SITE_OTHER): Payer: Medicare Other | Admitting: Internal Medicine

## 2019-02-01 VITALS — BP 166/70 | HR 52 | Temp 98.1°F | Resp 16 | Ht 69.5 in | Wt 162.0 lb

## 2019-02-01 DIAGNOSIS — R972 Elevated prostate specific antigen [PSA]: Secondary | ICD-10-CM

## 2019-02-01 DIAGNOSIS — K219 Gastro-esophageal reflux disease without esophagitis: Secondary | ICD-10-CM

## 2019-02-01 DIAGNOSIS — N189 Chronic kidney disease, unspecified: Secondary | ICD-10-CM

## 2019-02-01 DIAGNOSIS — E782 Mixed hyperlipidemia: Secondary | ICD-10-CM

## 2019-02-01 DIAGNOSIS — R7303 Prediabetes: Secondary | ICD-10-CM

## 2019-02-01 LAB — CBC WITH DIFFERENTIAL/PLATELET
Basophils Absolute: 0 10*3/uL (ref 0.0–0.1)
Basophils Relative: 0.5 % (ref 0.0–3.0)
Eosinophils Absolute: 0.4 10*3/uL (ref 0.0–0.7)
Eosinophils Relative: 5.1 % — ABNORMAL HIGH (ref 0.0–5.0)
HCT: 39.7 % (ref 39.0–52.0)
Hemoglobin: 13.6 g/dL (ref 13.0–17.0)
Lymphocytes Relative: 17 % (ref 12.0–46.0)
Lymphs Abs: 1.3 10*3/uL (ref 0.7–4.0)
MCHC: 34.2 g/dL (ref 30.0–36.0)
MCV: 96 fl (ref 78.0–100.0)
Monocytes Absolute: 0.8 10*3/uL (ref 0.1–1.0)
Monocytes Relative: 10.5 % (ref 3.0–12.0)
Neutro Abs: 5.2 10*3/uL (ref 1.4–7.7)
Neutrophils Relative %: 66.9 % (ref 43.0–77.0)
Platelets: 195 10*3/uL (ref 150.0–400.0)
RBC: 4.14 Mil/uL — ABNORMAL LOW (ref 4.22–5.81)
RDW: 13.8 % (ref 11.5–15.5)
WBC: 7.8 10*3/uL (ref 4.0–10.5)

## 2019-02-01 LAB — COMPREHENSIVE METABOLIC PANEL
ALT: 13 U/L (ref 0–53)
AST: 18 U/L (ref 0–37)
Albumin: 4.2 g/dL (ref 3.5–5.2)
Alkaline Phosphatase: 38 U/L — ABNORMAL LOW (ref 39–117)
BUN: 16 mg/dL (ref 6–23)
CO2: 28 mEq/L (ref 19–32)
Calcium: 9.4 mg/dL (ref 8.4–10.5)
Chloride: 105 mEq/L (ref 96–112)
Creatinine, Ser: 1.21 mg/dL (ref 0.40–1.50)
GFR: 57.61 mL/min — ABNORMAL LOW (ref >60.00)
Glucose, Bld: 127 mg/dL — ABNORMAL HIGH (ref 70–99)
Potassium: 4.2 mEq/L (ref 3.5–5.1)
Sodium: 141 mEq/L (ref 135–145)
Total Bilirubin: 1 mg/dL (ref 0.2–1.2)
Total Protein: 6.9 g/dL (ref 6.0–8.3)

## 2019-02-01 LAB — LIPID PANEL
Cholesterol: 111 mg/dL (ref 0–200)
HDL: 46.4 mg/dL (ref >39.00)
LDL Cholesterol: 49 mg/dL (ref 0–99)
NonHDL: 64.21
Total CHOL/HDL Ratio: 2
Triglycerides: 78 mg/dL (ref 0.0–149.0)
VLDL: 15.6 mg/dL (ref 0.0–40.0)

## 2019-02-01 LAB — HEMOGLOBIN A1C: Hgb A1c MFr Bld: 6.4 % (ref 4.6–6.5)

## 2019-02-01 NOTE — Assessment & Plan Note (Signed)
No reflux - taking for lungs Continue current medications

## 2019-02-01 NOTE — Assessment & Plan Note (Signed)
Check a1c Low sugar / carb diet Stressed regular exercise   

## 2019-02-01 NOTE — Assessment & Plan Note (Signed)
Drinks water throughout day Rarely takes advil cmp

## 2019-02-01 NOTE — Assessment & Plan Note (Signed)
Urology advised f/u with him as needed

## 2019-02-01 NOTE — Assessment & Plan Note (Signed)
Check lipid panel, cmp Continue daily statin Regular exercise and healthy diet encouraged  

## 2019-02-17 ENCOUNTER — Other Ambulatory Visit: Payer: Self-pay | Admitting: Internal Medicine

## 2019-02-17 DIAGNOSIS — J8410 Pulmonary fibrosis, unspecified: Secondary | ICD-10-CM

## 2019-02-22 NOTE — Progress Notes (Addendum)
Subjective:   Walter Hess is a 78 y.o. male who presents for Medicare Annual/Subsequent preventive examination.  Review of Systems:   Cardiac Risk Factors include: advanced age (>55men, >71 women);male gender;dyslipidemia  Home Safety/Smoke Alarms: Feels safe in home. Smoke alarms in place.  Living environment; residence and Firearm Safety: 2-story house. Lives with wife, no needs for DME, good support system. Seat Belt Safety/Bike Helmet: Wears seat belt.     Objective:    Vitals: BP 130/68   Pulse 64   Resp 16   Ht 5\' 10"  (1.778 m)   Wt 163 lb (73.9 kg)   SpO2 98%   BMI 23.39 kg/m   Body mass index is 23.39 kg/m.  Advanced Directives 02/23/2019 04/14/2014  Does Patient Have a Medical Advance Directive? Yes Yes  Type of Paramedic of [place];Living will Living will;Healthcare Power of [place] in Chart? No - copy requested -    Tobacco Social History   Tobacco Use  Smoking Status Former Smoker  . Packs/day: 0.50  . Years: 5.00  . Pack years: 2.50  . Quit date: 06/25/1963  . Years since quitting: 55.7  Smokeless Tobacco Never Used     Counseling given: Not Answered  Past Medical History:  Diagnosis Date  . Anemia    former blood donor for decades; stopped 2006  . Diverticulosis 2005   Dr. Olevia Perches  . Elevated prostate specific antigen (PSA)    Dr. Amalia Hailey [facility] patient states due to infection  . Gilbert's syndrome   . Hyperlipidemia    Past Surgical History:  Procedure Laterality Date  . CATARACT EXTRACTION, BILATERAL  2017  . COLONOSCOPY  2005   Diverticulosis, Dr.Brodie.  Marland Kitchen GANGLION CYST EXCISION  2005   5th L digit  . LUMBAR LAMINECTOMY  1981   Family History  Problem Relation Age of Onset  . Heart attack Father 61       former smoker  . Alzheimer's disease Mother   . Coronary artery disease Sister        open heart surgery  . Diabetes Paternal Aunt   . Heart attack Paternal  Uncle 61  . Coronary artery disease Paternal Grandfather   . Prostate cancer Paternal Grandfather   . Prostate cancer Maternal Uncle   . Stroke Neg Hx   . Colon cancer Neg Hx    Social History   Socioeconomic History  . Marital status: Married    Spouse name: Not on file  . Number of children: 2  . Years of education: Not on file  . Highest education level: Not on file  Occupational History  . Occupation: retired  Scientific laboratory technician  . Financial resource strain: Not hard at all  . Food insecurity    Worry: Never true    Inability: Never true  . Transportation needs    Medical: No    Non-medical: No  Tobacco Use  . Smoking status: Former Smoker    Packs/day: 0.50    Years: 5.00    Pack years: 2.50    Quit date: 06/25/1963    Years since quitting: 55.7  . Smokeless tobacco: Never Used  Substance and Sexual Activity  . Alcohol use: Yes    Alcohol/week: 0.0 standard drinks    Comment: beer occasionally  . Drug use: No  . Sexual activity: Not Currently  Lifestyle  . Physical activity    Days per week: 6 days    Minutes  per session: 50 min  . Stress: Not at all  Relationships  . Social connections    Talks on phone: More than three times a week    Gets together: More than three times a week    Attends religious service: More than 4 times per year    Active member of club or organization: Yes    Attends meetings of clubs or organizations: More than 4 times per year    Relationship status: Married  Other Topics Concern  . Not on file  Social History Narrative  . Not on file    Outpatient Encounter Medications as of 02/23/2019  Medication Sig  . aspirin 81 MG tablet Take 81 mg by mouth daily.    . Coenzyme Q10 (COQ10) 100 MG CAPS Take by mouth daily.  . famotidine (PEPCID) 20 MG tablet TAKE 1 TABLET BY MOUTH EVERYDAY AT BEDTIME  . Multiple Vitamin (MULTIVITAMIN) tablet Take 1 tablet by mouth daily.    . pantoprazole (PROTONIX) 40 MG tablet TAKE 1 TABLET EVERY DAY 30-60  MINUTES BEFORE THE 1ST MEAL OF THE DAY  . rosuvastatin (CRESTOR) 5 MG tablet TAKE 1 TABLET BY MOUTH EVERY DAY  . S-Adenosylmethionine (SAM-E) 200 MG TABS Take by mouth daily.    . Saw Palmetto 450 MG CAPS Take by mouth daily.    . Turmeric 500 MG CAPS Take by mouth daily.     No facility-administered encounter medications on file as of 02/23/2019.     Activities of Daily Living In your present state of health, do you have any difficulty performing the following activities: 02/23/2019  Hearing? N  Vision? N  Difficulty concentrating or making decisions? N  Walking or climbing stairs? N  Dressing or bathing? N  Doing errands, shopping? N  Preparing Food and eating ? N  Using the Toilet? N  In the past six months, have you accidently leaked urine? N  Do you have problems with loss of bowel control? N  Managing your Medications? N  Managing your Finances? N  Housekeeping or managing your Housekeeping? N  Some recent data might be hidden    Patient Care Team: Binnie Rail, MD as PCP - General (Internal Medicine) Tanda Rockers, MD as Consulting Physician (Pulmonary Disease) Domingo Pulse, MD (Urology)   Assessment:   This is a routine wellness examination for Kengo. Physical assessment deferred to PCP.   Exercise Activities and Dietary recommendations Current Exercise Habits: Home exercise routine, Type of exercise: treadmill, Time (Minutes): 40, Frequency (Times/Week): 6, Weekly Exercise (Minutes/Week): 240, Intensity: Mild, Exercise limited by: None identified  Diet (meal preparation, eat out, water intake, caffeinated beverages, dairy products, fruits and vegetables): in general, a "healthy" diet  , well balanced. eats a variety of fruits and vegetables daily, limits salt, fat/cholesterol, sugar,carbohydrates,caffeine, drinks 6-8 glasses of water daily.  Goals    . Patient Stated     Continue to exercise, eat healthy, enjoy life and family.       Fall Risk Fall Risk   02/23/2019 02/01/2019 08/03/2018 01/28/2018 01/23/2018  Falls in the past year? 0 0 0 No No  Comment - - - - Emmi Telephone Survey: data to providers prior to load  Number falls in past yr: 0 0 - - -  Injury with Fall? 0 - - - -   Is the patient's home free of loose throw rugs in walkways, pet beds, electrical cords, etc?   yes      Grab bars  in the bathroom? yes      Handrails on the stairs?   yes      Adequate lighting?   yes  Depression Screen PHQ 2/9 Scores 02/23/2019 02/01/2019 08/03/2018 01/28/2018  PHQ - 2 Score 0 0 0 0    Cognitive Function       Ad8 score reviewed for issues:  Issues making decisions: no  Less interest in hobbies / activities: no  Repeats questions, stories (family complaining): no  Trouble using ordinary gadgets (microwave, computer, phone):no  Forgets the month or year: no  Mismanaging finances: no  Remembering appts: no  Daily problems with thinking and/or memory: no Ad8 score is= 0  Immunization History  Administered Date(s) Administered  . Influenza Split 04/03/2011, 04/08/2012  . Influenza Whole 04/07/2007, 03/31/2008, 03/22/2009, 03/27/2010  . Influenza, High Dose Seasonal PF 03/25/2013, 03/23/2014, 03/14/2017, 03/20/2018  . Influenza-Unspecified 03/14/2015, 02/08/2016, 03/14/2017  . Pneumococcal Conjugate-13 01/23/2015  . Pneumococcal Polysaccharide-23 10/05/2009  . Tdap 04/26/2004  . Tetanus 07/01/2014  . Zoster 06/25/2011   Screening Tests Health Maintenance  Topic Date Due  . INFLUENZA VACCINE  10/02/2019 (Originally 01/23/2019)  . TETANUS/TDAP  07/01/2024  . PNA vac Low Risk Adult  Completed      Plan:    Reviewed health maintenance screenings with patient today and relevant education, vaccines, and/or referrals were provided.   I have personally reviewed and noted the following in the patient's chart:   . Medical and social history . Use of alcohol, tobacco or illicit drugs  . Current medications and supplements . Functional  ability and status . Nutritional status . Physical activity . Advanced directives . List of other physicians . Vitals . Screenings to include cognitive, depression, and falls . Referrals and appointments  In addition, I have reviewed and discussed with patient certain preventive protocols, quality metrics, and best practice recommendations. A written personalized care plan for preventive services as well as general preventive health recommendations were provided to patient.     Michiel Cowboy, RN  02/23/2019    Medical screening examination/treatment/procedure(s) were performed by non-physician practitioner and as supervising physician I was immediately available for consultation/collaboration. I agree with above. Binnie Rail, MD

## 2019-02-23 ENCOUNTER — Ambulatory Visit (INDEPENDENT_AMBULATORY_CARE_PROVIDER_SITE_OTHER): Payer: Medicare Other | Admitting: *Deleted

## 2019-02-23 ENCOUNTER — Other Ambulatory Visit: Payer: Self-pay

## 2019-02-23 VITALS — BP 130/68 | HR 64 | Resp 16 | Ht 70.0 in | Wt 163.0 lb

## 2019-02-23 DIAGNOSIS — Z0000 Encounter for general adult medical examination without abnormal findings: Secondary | ICD-10-CM | POA: Diagnosis not present

## 2019-02-23 NOTE — Patient Instructions (Signed)
Continue doing brain stimulating activities (puzzles, reading, adult coloring books, staying active) to keep memory sharp.   Continue to eat heart healthy diet (full of fruits, vegetables, whole grains, lean protein, water--limit salt, fat, and sugar intake) and increase physical activity as tolerated.   Walter Hess , Thank you for taking time to come for your Medicare Wellness Visit. I appreciate your ongoing commitment to your health goals. Please review the following plan we discussed and let me know if I can assist you in the future.   These are the goals we discussed: Goals    . Patient Stated     Continue to exercise, eat healthy, enjoy life and family.       This is a list of the screening recommended for you and due dates:  Health Maintenance  Topic Date Due  . Flu Shot  10/02/2019*  . Tetanus Vaccine  07/01/2024  . Pneumonia vaccines  Completed  *Topic was postponed. The date shown is not the original due date.    Preventive Care 37 Years and Older, Male Preventive care refers to lifestyle choices and visits with your health care provider that can promote health and wellness. This includes:  A yearly physical exam. This is also called an annual well check.  Regular dental and eye exams.  Immunizations.  Screening for certain conditions.  Healthy lifestyle choices, such as diet and exercise. What can I expect for my preventive care visit? Physical exam Your health care provider will check:  Height and weight. These may be used to calculate body mass index (BMI), which is a measurement that tells if you are at a healthy weight.  Heart rate and blood pressure.  Your skin for abnormal spots. Counseling Your health care provider may ask you questions about:  Alcohol, tobacco, and drug use.  Emotional well-being.  Home and relationship well-being.  Sexual activity.  Eating habits.  History of falls.  Memory and ability to understand (cognition).  Work  and work Statistician. What immunizations do I need?  Influenza (flu) vaccine  This is recommended every year. Tetanus, diphtheria, and pertussis (Tdap) vaccine  You may need a Td booster every 10 years. Varicella (chickenpox) vaccine  You may need this vaccine if you have not already been vaccinated. Zoster (shingles) vaccine  You may need this after age 16. Pneumococcal conjugate (PCV13) vaccine  One dose is recommended after age 10. Pneumococcal polysaccharide (PPSV23) vaccine  One dose is recommended after age 83. Measles, mumps, and rubella (MMR) vaccine  You may need at least one dose of MMR if you were born in 1957 or later. You may also need a second dose. Meningococcal conjugate (MenACWY) vaccine  You may need this if you have certain conditions. Hepatitis A vaccine  You may need this if you have certain conditions or if you travel or work in places where you may be exposed to hepatitis A. Hepatitis B vaccine  You may need this if you have certain conditions or if you travel or work in places where you may be exposed to hepatitis B. Haemophilus influenzae type b (Hib) vaccine  You may need this if you have certain conditions. You may receive vaccines as individual doses or as more than one vaccine together in one shot (combination vaccines). Talk with your health care provider about the risks and benefits of combination vaccines. What tests do I need? Blood tests  Lipid and cholesterol levels. These may be checked every 5 years, or more frequently depending  on your overall health.  Hepatitis C test.  Hepatitis B test. Screening  Lung cancer screening. You may have this screening every year starting at age 63 if you have a 30-pack-year history of smoking and currently smoke or have quit within the past 15 years.  Colorectal cancer screening. All adults should have this screening starting at age 27 and continuing until age 17. Your health care provider may  recommend screening at age 71 if you are at increased risk. You will have tests every 1-10 years, depending on your results and the type of screening test.  Prostate cancer screening. Recommendations will vary depending on your family history and other risks.  Diabetes screening. This is done by checking your blood sugar (glucose) after you have not eaten for a while (fasting). You may have this done every 1-3 years.  Abdominal aortic aneurysm (AAA) screening. You may need this if you are a current or former smoker.  Sexually transmitted disease (STD) testing. Follow these instructions at home: Eating and drinking  Eat a diet that includes fresh fruits and vegetables, whole grains, lean protein, and low-fat dairy products. Limit your intake of foods with high amounts of sugar, saturated fats, and salt.  Take vitamin and mineral supplements as recommended by your health care provider.  Do not drink alcohol if your health care provider tells you not to drink.  If you drink alcohol: ? Limit how much you have to 0-2 drinks a day. ? Be aware of how much alcohol is in your drink. In the [place] one drink equals one 12 oz bottle of beer (355 mL), one 5 oz glass of Maxximus Gotay (148 mL), or one 1 oz glass of hard liquor (44 mL). Lifestyle  Take daily care of your teeth and gums.  Stay active. Exercise for at least 30 minutes on 5 or more days each week.  Do not use any products that contain nicotine or tobacco, such as cigarettes, e-cigarettes, and chewing tobacco. If you need help quitting, ask your health care provider.  If you are sexually active, practice safe sex. Use a condom or other form of protection to prevent STIs (sexually transmitted infections).  Talk with your health care provider about taking a low-dose aspirin or statin. What's next?  Visit your health care provider once a year for a well check visit.  Ask your health care provider how often you should have your eyes and teeth  checked.  Stay up to date on all vaccines. This information is not intended to replace advice given to you by your health care provider. Make sure you discuss any questions you have with your health care provider. Document Released: 07/07/2015 Document Revised: 06/04/2018 Document Reviewed: 06/04/2018 Elsevier Patient Education  2020 [employer].

## 2019-03-08 DIAGNOSIS — H6123 Impacted cerumen, bilateral: Secondary | ICD-10-CM | POA: Diagnosis not present

## 2019-03-24 DIAGNOSIS — D485 Neoplasm of uncertain behavior of skin: Secondary | ICD-10-CM | POA: Diagnosis not present

## 2019-03-24 DIAGNOSIS — L821 Other seborrheic keratosis: Secondary | ICD-10-CM | POA: Diagnosis not present

## 2019-03-24 DIAGNOSIS — D1801 Hemangioma of skin and subcutaneous tissue: Secondary | ICD-10-CM | POA: Diagnosis not present

## 2019-03-24 DIAGNOSIS — L720 Epidermal cyst: Secondary | ICD-10-CM | POA: Diagnosis not present

## 2019-03-24 DIAGNOSIS — L400 Psoriasis vulgaris: Secondary | ICD-10-CM | POA: Diagnosis not present

## 2019-03-24 DIAGNOSIS — L738 Other specified follicular disorders: Secondary | ICD-10-CM | POA: Diagnosis not present

## 2019-04-20 DIAGNOSIS — Z961 Presence of intraocular lens: Secondary | ICD-10-CM | POA: Diagnosis not present

## 2019-04-20 DIAGNOSIS — H04123 Dry eye syndrome of bilateral lacrimal glands: Secondary | ICD-10-CM | POA: Diagnosis not present

## 2019-04-20 DIAGNOSIS — H40013 Open angle with borderline findings, low risk, bilateral: Secondary | ICD-10-CM | POA: Diagnosis not present

## 2019-04-20 DIAGNOSIS — H5053 Vertical heterophoria: Secondary | ICD-10-CM | POA: Diagnosis not present

## 2019-04-20 DIAGNOSIS — H40011 Open angle with borderline findings, low risk, right eye: Secondary | ICD-10-CM | POA: Diagnosis not present

## 2019-05-05 ENCOUNTER — Other Ambulatory Visit: Payer: Self-pay

## 2019-05-05 DIAGNOSIS — Z20822 Contact with and (suspected) exposure to covid-19: Secondary | ICD-10-CM

## 2019-05-05 DIAGNOSIS — Z20828 Contact with and (suspected) exposure to other viral communicable diseases: Secondary | ICD-10-CM | POA: Diagnosis not present

## 2019-05-07 LAB — NOVEL CORONAVIRUS, NAA: SARS-CoV-2, NAA: NOT DETECTED

## 2019-05-12 ENCOUNTER — Other Ambulatory Visit: Payer: Self-pay | Admitting: Internal Medicine

## 2019-05-12 DIAGNOSIS — J8410 Pulmonary fibrosis, unspecified: Secondary | ICD-10-CM

## 2019-05-13 ENCOUNTER — Other Ambulatory Visit: Payer: Self-pay

## 2019-05-13 DIAGNOSIS — J8410 Pulmonary fibrosis, unspecified: Secondary | ICD-10-CM

## 2019-07-07 ENCOUNTER — Ambulatory Visit: Payer: Medicare Other | Attending: Internal Medicine

## 2019-07-07 DIAGNOSIS — Z23 Encounter for immunization: Secondary | ICD-10-CM | POA: Insufficient documentation

## 2019-07-07 NOTE — Progress Notes (Signed)
   Covid-19 Vaccination Clinic  Name:  Walter Hess    MRN: [id number] DOB: 1941/05/28  07/07/2019  [name] was observed post Covid-19 immunization for 15 minutes without incidence. He was provided with Vaccine Information Sheet and instruction to access the V-Safe system.   [name] was instructed to call 911 with any severe reactions post vaccine: Marland Kitchen Difficulty breathing  . Swelling of your face and throat  . A fast heartbeat  . A bad rash all over your body  . Dizziness and weakness    Immunizations Administered    Name Date Dose VIS Date Route   Pfizer COVID-19 Vaccine 07/07/2019  9:41 AM 0.3 mL 06/04/2019 Intramuscular   Manufacturer: [place]   Lot: [id number]   [place]: [id number]

## 2019-07-08 ENCOUNTER — Ambulatory Visit: Payer: Medicare Other

## 2019-07-14 ENCOUNTER — Other Ambulatory Visit: Payer: Self-pay | Admitting: Internal Medicine

## 2019-07-27 ENCOUNTER — Ambulatory Visit: Payer: Medicare Other | Attending: Internal Medicine

## 2019-07-27 DIAGNOSIS — Z23 Encounter for immunization: Secondary | ICD-10-CM | POA: Insufficient documentation

## 2019-07-27 NOTE — Progress Notes (Signed)
   Covid-19 Vaccination Clinic  Name:  Dawsen Purucker    MRN: [id number] DOB: [birth year]  07/27/2019  Mr. Saupe was observed post Covid-19 immunization for 15 minutes without incidence. He was provided with Vaccine Information Sheet and instruction to access the V-Safe system.   Mr. Caplette was instructed to call 911 with any severe reactions post vaccine: Marland Kitchen Difficulty breathing  . Swelling of your face and throat  . A fast heartbeat  . A bad rash all over your body  . Dizziness and weakness    Immunizations Administered    Name Date Dose VIS Date Route   Pfizer COVID-19 Vaccine 07/27/2019  8:21 AM 0.3 mL 06/04/2019 Intramuscular   Manufacturer: [place]   Lot: [id number]   [place]: [id number]

## 2019-08-03 NOTE — Patient Instructions (Addendum)
  Blood work was ordered.     Medications reviewed and updated.  Changes include :   Continue protonix daily.  Your prescription(s) have been submitted to your pharmacy. Please take as directed and contact our office if you believe you are having problem(s) with the medication(s).    Please followup in 6 months

## 2019-08-03 NOTE — Progress Notes (Addendum)
Subjective:    Patient ID: Walter Hess, male    DOB: 1940/10/12, 79 y.o.   MRN: [id number]  HPI The patient is here for follow up of their chronic medical problems, including prediabetes, hyperlipidemia, CKD, pulmonary fibrosis, GERD   He is taking all of his medications as prescribed.    He is exercising regularly.     He has reduced his sugar intake.  He is drinking a good amount of fluid daily.  He rarely takes advil.    He stopped the PPI and pepcid - it was not refilled by pulmonary.  He wondered if he still needed it.     Medications and allergies reviewed with patient and updated if appropriate.  Patient Active Problem List   Diagnosis Date Noted  . Aortic atherosclerosis ([place]) 01/28/2018  . Decreased hearing 01/27/2017  . GERD (gastroesophageal reflux disease) 01/26/2017  . Cough 06/27/2016  . Sinusitis, chronic 02/29/2016  . Postinflammatory pulmonary fibrosis ([place]) 02/29/2016  . Chronic kidney disease 01/30/2016  . Lung nodule 01/28/2016  . Prediabetes 01/25/2016  . Vertebral basilar insufficiency 01/25/2016  . DUPUYTREN'S CONTRACTURE, RIGHT 10/05/2009  . HYPERLIPIDEMIA 06/24/2007  . DIVERTICULOSIS, COLON 06/24/2007  . PROSTATE SPECIFIC ANTIGEN( PSA), ELEVATED 06/24/2007  . NONSPECIFIC ABNORMAL ELECTROCARDIOGRAM 06/24/2007    Current Outpatient Medications on File Prior to Visit  Medication Sig Dispense Refill  . aspirin 81 MG tablet Take 81 mg by mouth daily.      . Coenzyme Q10 (COQ10) 100 MG CAPS Take by mouth daily.    . Multiple Vitamin (MULTIVITAMIN) tablet Take 1 tablet by mouth daily.      . rosuvastatin (CRESTOR) 5 MG tablet TAKE 1 TABLET BY MOUTH EVERY DAY 90 tablet 1  . S-Adenosylmethionine (SAM-E) 200 MG TABS Take by mouth daily.      . Saw Palmetto 450 MG CAPS Take by mouth daily.      . Turmeric 500 MG CAPS Take by mouth daily.       No current facility-administered medications on file prior to visit.    Past Medical History:    Diagnosis Date  . Anemia    former blood donor for decades; stopped 2006  . Diverticulosis 2005   Dr. Olevia Perches  . Elevated prostate specific antigen (PSA)    Dr. Amalia Hailey [facility] patient states due to infection  . Gilbert's syndrome   . Hyperlipidemia     Past Surgical History:  Procedure Laterality Date  . CATARACT EXTRACTION, BILATERAL  2017  . COLONOSCOPY  2005   Diverticulosis, Dr.Brodie.  Marland Kitchen GANGLION CYST EXCISION  2005   5th L digit  . LUMBAR LAMINECTOMY  1981    Social History   Socioeconomic History  . Marital status: Married    Spouse name: Not on file  . Number of children: 2  . Years of education: Not on file  . Highest education level: Not on file  Occupational History  . Occupation: retired  Tobacco Use  . Smoking status: Former Smoker    Packs/day: 0.50    Years: 5.00    Pack years: 2.50    Quit date: 06/25/1963    Years since quitting: 56.1  . Smokeless tobacco: Never Used  Substance and Sexual Activity  . Alcohol use: Yes    Alcohol/week: 0.0 standard drinks    Comment: beer occasionally  . Drug use: No  . Sexual activity: Not Currently  Other Topics Concern  . Not on file  Social History Narrative  .  Not on file   Social Determinants of Health   Financial Resource Strain: Low Risk   . Difficulty of Paying Living Expenses: Not hard at all  Food Insecurity: No Food Insecurity  . Worried About Charity fundraiser in the Last Year: Never true  . Ran Out of Food in the Last Year: Never true  Transportation Needs: No Transportation Needs  . Lack of Transportation (Medical): No  . Lack of Transportation (Non-Medical): No  Physical Activity: Sufficiently Active  . Days of Exercise per Week: 6 days  . Minutes of Exercise per Session: 50 min  Stress: No Stress Concern Present  . Feeling of Stress : Not at all  Social Connections: Not Isolated  . Frequency of Communication with Friends and Family: More than three times a week  . Frequency of Social  Gatherings with Friends and Family: More than three times a week  . Attends Religious Services: More than 4 times per year  . Active Member of Clubs or Organizations: Yes  . Attends Archivist Meetings: More than 4 times per year  . Marital Status: Married    Family History  Problem Relation Age of Onset  . Heart attack Father 76       former smoker  . Alzheimer's disease Mother   . Coronary artery disease Sister        open heart surgery  . Diabetes Paternal Aunt   . Heart attack Paternal Uncle 1  . Coronary artery disease Paternal Grandfather   . Prostate cancer Paternal Grandfather   . Prostate cancer Maternal Uncle   . Stroke Neg Hx   . Colon cancer Neg Hx     Review of Systems  Constitutional: Negative for chills and fever.  Respiratory: Negative for cough, shortness of breath and wheezing.   Cardiovascular: Negative for chest pain, palpitations and leg swelling.  Neurological: Negative for light-headedness and headaches.       Objective:   Vitals:   08/04/19 0826  BP: (!) 142/72  Pulse: 71  Resp: 16  Temp: 98.2 F (36.8 C)  SpO2: 99%   BP Readings from Last 3 Encounters:  08/04/19 (!) 142/72  02/23/19 130/68  02/01/19 (!) 166/70   Wt Readings from Last 3 Encounters:  08/04/19 160 lb (72.6 kg)  02/23/19 163 lb (73.9 kg)  02/01/19 162 lb (73.5 kg)   Body mass index is 22.96 kg/m.   Physical Exam    Constitutional: Appears well-developed and well-nourished. No distress.  HENT:  Head: Normocephalic and atraumatic.  Neck: Neck supple. No tracheal deviation present. No thyromegaly present.  No cervical lymphadenopathy Cardiovascular: Normal rate, regular rhythm and normal heart sounds.  No murmur heard. No carotid bruit .  No edema Pulmonary/Chest: Effort normal. No respiratory distress. No has no wheezes. Bibasilar dry crackles.  Skin: Skin is warm and dry. Not diaphoretic.  Psychiatric: Normal mood and affect. Behavior is normal.       Assessment & Plan:    See Problem List for Assessment and Plan of chronic medical problems.    This visit occurred during the SARS-CoV-2 public health emergency.  Safety protocols were in place, including screening questions prior to the visit, additional usage of staff PPE, and extensive cleaning of exam room while observing appropriate contact time as indicated for disinfecting solutions.

## 2019-08-04 ENCOUNTER — Other Ambulatory Visit: Payer: Self-pay

## 2019-08-04 ENCOUNTER — Encounter: Payer: Self-pay | Admitting: Internal Medicine

## 2019-08-04 ENCOUNTER — Ambulatory Visit (INDEPENDENT_AMBULATORY_CARE_PROVIDER_SITE_OTHER): Payer: Medicare Other | Admitting: Internal Medicine

## 2019-08-04 VITALS — BP 142/72 | HR 71 | Temp 98.2°F | Resp 16 | Ht 70.0 in | Wt 160.0 lb

## 2019-08-04 DIAGNOSIS — K219 Gastro-esophageal reflux disease without esophagitis: Secondary | ICD-10-CM

## 2019-08-04 DIAGNOSIS — N1831 Chronic kidney disease, stage 3a: Secondary | ICD-10-CM | POA: Diagnosis not present

## 2019-08-04 DIAGNOSIS — E782 Mixed hyperlipidemia: Secondary | ICD-10-CM | POA: Diagnosis not present

## 2019-08-04 DIAGNOSIS — R7303 Prediabetes: Secondary | ICD-10-CM

## 2019-08-04 DIAGNOSIS — J8410 Pulmonary fibrosis, unspecified: Secondary | ICD-10-CM

## 2019-08-04 LAB — LIPID PANEL
Cholesterol: 98 mg/dL (ref 0–200)
HDL: 42.1 mg/dL (ref >39.00)
LDL Cholesterol: 40 mg/dL (ref 0–99)
NonHDL: 55.44
Total CHOL/HDL Ratio: 2
Triglycerides: 78 mg/dL (ref 0.0–149.0)
VLDL: 15.6 mg/dL (ref 0.0–40.0)

## 2019-08-04 LAB — COMPREHENSIVE METABOLIC PANEL
ALT: 15 U/L (ref 0–53)
AST: 19 U/L (ref 0–37)
Albumin: 3.9 g/dL (ref 3.5–5.2)
Alkaline Phosphatase: 40 U/L (ref 39–117)
BUN: 17 mg/dL (ref 6–23)
CO2: 31 mEq/L (ref 19–32)
Calcium: 9.1 mg/dL (ref 8.4–10.5)
Chloride: 106 mEq/L (ref 96–112)
Creatinine, Ser: 1.03 mg/dL (ref 0.40–1.50)
GFR: 69.29 mL/min (ref >60.00)
Glucose, Bld: 112 mg/dL — ABNORMAL HIGH (ref 70–99)
Potassium: 4.8 mEq/L (ref 3.5–5.1)
Sodium: 140 mEq/L (ref 135–145)
Total Bilirubin: 1 mg/dL (ref 0.2–1.2)
Total Protein: 6.7 g/dL (ref 6.0–8.3)

## 2019-08-04 LAB — HEMOGLOBIN A1C: Hgb A1c MFr Bld: 6.3 % (ref 4.6–6.5)

## 2019-08-04 MED ORDER — PANTOPRAZOLE SODIUM 40 MG PO TBEC: DELAYED_RELEASE_TABLET | ORAL | 3 refills | Status: DC

## 2019-08-04 NOTE — Assessment & Plan Note (Signed)
Chronic Check lipid panel, cmp Continue daily statin Regular exercise and healthy diet encouraged  

## 2019-08-04 NOTE — Assessment & Plan Note (Addendum)
Has had decreased lung function - ? Secondary to silent GERD Started on protonix by Dr Melvyn Novas Will restart protonix daily - has some increase in gastric acid and may be affecting pulm fibrosis

## 2019-08-04 NOTE — Assessment & Plan Note (Signed)
Chronic Check a1c Low sugar / carb diet Continue regular exercise 

## 2019-08-04 NOTE — Assessment & Plan Note (Signed)
Chronic cmp Continue increased fluids, avoiding nsaids

## 2019-09-21 DIAGNOSIS — D692 Other nonthrombocytopenic purpura: Secondary | ICD-10-CM | POA: Diagnosis not present

## 2019-09-21 DIAGNOSIS — L400 Psoriasis vulgaris: Secondary | ICD-10-CM | POA: Diagnosis not present

## 2019-09-21 DIAGNOSIS — D1801 Hemangioma of skin and subcutaneous tissue: Secondary | ICD-10-CM | POA: Diagnosis not present

## 2019-09-21 DIAGNOSIS — L821 Other seborrheic keratosis: Secondary | ICD-10-CM | POA: Diagnosis not present

## 2019-09-21 DIAGNOSIS — L918 Other hypertrophic disorders of the skin: Secondary | ICD-10-CM | POA: Diagnosis not present

## 2019-09-21 DIAGNOSIS — L570 Actinic keratosis: Secondary | ICD-10-CM | POA: Diagnosis not present

## 2020-01-12 ENCOUNTER — Other Ambulatory Visit: Payer: Self-pay | Admitting: Internal Medicine

## 2020-01-27 ENCOUNTER — Encounter: Payer: Self-pay | Admitting: Internal Medicine

## 2020-01-27 ENCOUNTER — Ambulatory Visit (INDEPENDENT_AMBULATORY_CARE_PROVIDER_SITE_OTHER): Payer: Medicare Other | Admitting: Internal Medicine

## 2020-01-27 ENCOUNTER — Other Ambulatory Visit: Payer: Self-pay

## 2020-01-27 DIAGNOSIS — J329 Chronic sinusitis, unspecified: Secondary | ICD-10-CM | POA: Diagnosis not present

## 2020-01-27 DIAGNOSIS — J8410 Pulmonary fibrosis, unspecified: Secondary | ICD-10-CM

## 2020-01-27 MED ORDER — FAMOTIDINE 20 MG PO TABS: ORAL_TABLET | ORAL | 3 refills | Status: DC

## 2020-01-27 NOTE — Assessment & Plan Note (Signed)
MRI [facility] 07/21/2015 . Obstructive pattern left maxillary and ethmoid sinusitis, may be chronic. - 06/27/2016  rx augmentin bid x 10days >  ent f/u Janace Hoard  >>> referred back to Dr Janace Hoard group

## 2020-01-27 NOTE — Progress Notes (Signed)
Subjective:     Patient ID: Walter Hess, male   DOB: 11-Dec-1940     MRN: [id number]    Brief patient profile:  24 yowm never regular smoker stopped completely  in his 20's and no lower resp problems just sinus symptoms starting in 40s with recurrent infections  but no need for  specialty eval or need for rx then routine cxr suggested lung nodule in 01/2015 repeat in 01/2016 suggested PF so referred to pulmonary clinic 02/29/2016 by Dr  Quay Burow with dx of non UIP PF    History of Present Illness  02/29/2016 1st [facility] office visit/ Alwin Lanigan   Chief Complaint  Patient presents with  . Pulmonary Consult    Referred by Dr. Billey Gosling for abnormal cxr. Pt denies any respiratory co's today.    6 days a week does 5 degrees of elevation at 3-4 mph x 25 min s sob x 40 year pattern s change rec Stay as active as you can and let me know if you are  losing ground with exercise or your oxygen saturation is dropping below your baseline   - above 90% is normal - pulse oximeter is the name of the instrument    05/30/2016  f/u ov/Cleatis Fandrich re: pf/ no change ex tol  Chief Complaint  Patient presents with  . Follow-up    Breathing is unchanged. No new co's today. PFT's done.   with peak exercise on his home treamill no limiting sob and  never less than 94% sats rec Pantoprazole (protonix) 40 mg   Take  30-60 min before first meal of the day and Pepcid (famotidine)  20 mg one @  bedtime until return to office - this is the best way to tell whether stomach acid is contributing to your problem.  Stop fish oil and eat more fish  GERD diet    06/27/2016 acute extended ov/Kyshaun Barnette re: acute cough in pt with underlying PF maint rx ppi/h2hs  Chief Complaint  Patient presents with  . Acute Visit    Pt c/o cough and chest congestion- onset 5 days ago.  He has had some dark colored nasal d/c.    cc chronic nasal congestion for which he uses nettipot nightly x years but then much worse  Abruptly x one week prior to  OV   then started with cough 2 days p flare of nasal symptoms with nasal secrtions and sputum turning dark grey/green but no fever or sob   rec For cough > mucinex dm up to 1200 mg every 12 hours as needed  Augmentin 875 mg take one pill twice daily  X 10 days - take at breakfast and supper with large glass of water.  It would help reduce the usual side effects (diarrhea and yeast infections) if you ate cultured yogurt at lunch.  Please remember to go to the  x-ray department downstairs for your tests - we will call you with the results when they are available.   11/25/2016  f/u ov/Sanita Estrada re:  PF / cough resolved on gerd rx  Chief Complaint  Patient presents with  . Follow-up    Cough has resolved. No new co's.    6 days per week, treadmill at 5 degrees x 4.5 mph x 25 min with sats 92% and no change ex tol or sats  No cough at all  rec No change in acid suppression  Please see patient coordinator before you leave today  to schedule HRCT in 2  months        11/25/2017  f/u ov/Sakiyah Shur re: PF  Non dx HRCT only rx is for gerd Chief Complaint  Patient presents with  . Follow-up    Breathing is unchanged since last OV. Pt does not have any complaints today. PFT was performed.  Dyspnea:  No change in sats with ex  Cough: sporadic, not with ex Sleep: fine rec Continue your exercise program and let me know if you note any decline in 02 sats with exercise over time      01/27/2020  f/u ov/Chabeli Barsamian re:  PF/ non dx HRCT  Chief Complaint  Patient presents with  . Follow-up    Pt states has noticed decreased o2 sats 88-89%ra after walks on his treadmill. He also c/o minimal fatigue and hoarseness.   Dyspnea:  Changed treadmill after wore out the hold one  Cough: more raspy on protonix 40 mg ac / off pepcid since first of year  Sleeping: ok with 4 in blocks  SABA use: none  02: none    No obvious day to day or daytime variability or assoc excess/ purulent sputum or mucus plugs or hemoptysis or cp or  chest tightness, subjective wheeze or overt sinus or hb symptoms.   Sleeping without nocturnal  or early am exacerbation  of respiratory  c/o's or need for noct saba. Also denies any obvious fluctuation of symptoms with weather or environmental changes or other aggravating or alleviating factors except as outlined above   No unusual exposure hx or h/o childhood pna/ asthma or knowledge of premature birth.  Current Allergies, Complete Past Medical History, Past Surgical History, Family History, and Social History were reviewed in [employer] record.  ROS  The following are not active complaints unless bolded Hoarseness, sore throat, dysphagia, dental problems, itching, sneezing,  nasal congestion or discharge of excess mucus or purulent secretions, ear ache,   fever, chills, sweats, unintended wt loss or wt gain, classically pleuritic or exertional cp,  orthopnea pnd or arm/hand swelling  or leg swelling, presyncope, palpitations, abdominal pain, anorexia, nausea, vomiting, diarrhea  or change in bowel habits or change in bladder habits, change in stools or change in urine, dysuria, hematuria,  rash, arthralgias, visual complaints, headache, numbness, weakness or ataxia or problems with walking or coordination,  change in mood or  memory.        Current Meds  Medication Sig  . aspirin 81 MG tablet Take 81 mg by mouth daily.    . Coenzyme Q10 (COQ10) 100 MG CAPS Take by mouth daily.  . Multiple Vitamin (MULTIVITAMIN) tablet Take 1 tablet by mouth daily.    . pantoprazole (PROTONIX) 40 MG tablet Take 40 mg by mouth daily.  . rosuvastatin (CRESTOR) 5 MG tablet TAKE 1 TABLET BY MOUTH EVERY DAY  . S-Adenosylmethionine (SAM-E) 200 MG TABS Take by mouth daily.    . Saw Palmetto 450 MG CAPS Take by mouth daily.    . Turmeric 500 MG CAPS Take by mouth daily.                         Objective:   Physical Exam   01/27/2020           162  12/11/2018        160 11/25/2017           165  05/27/2017        166  11/25/2016  167  06/27/2016          163 05/30/2016        168   02/29/16 169 lb 12.8 oz (77 kg)  01/25/16 160 lb 8 oz (72.8 kg)  01/23/15 170 lb (77.1 kg)        amb pleasant wm with raspy voice   HEENT : pt wearing mask not removed for exam due to covid -19 concerns.    NECK :  without JVD/Nodes/TM/ nl carotid upstrokes bilaterally   LUNGS: no acc muscle use,  Nl contour chest with minimal crackles in bases bilaterally without cough on insp or exp maneuvers   CV:  RRR  no s3 or murmur or increase in P2, and no edema   ABD:  soft and nontender with nl inspiratory excursion in the supine position. No bruits or organomegaly appreciated, bowel sounds nl  MS:  Nl gait/ ext warm without deformities, calf tenderness, cyanosis or clubbing No obvious joint restrictions   SKIN: warm and dry without lesions    NEURO:  alert, approp, nl sensorium with  no motor or cerebellar deficits apparent.            Assessment:

## 2020-01-27 NOTE — Patient Instructions (Signed)
Add back pepcid (famotidine) after supper  or at bedtime   We will contact you regarding a CT chest and call the results   Make sure you check your oxygen saturations at highest level of activity to be sure it stays over 90% and keep track of it at least once a week, more often if breathing getting worse, and let me know if losing ground.    Please schedule a follow up visit in 6  months but call sooner if needed

## 2020-01-27 NOTE — Assessment & Plan Note (Signed)
Incidental finding/ pt asymptomatic Ct scan 01/2016:  No suspicious pulmonary nodules. Changes of pulmonary fibrosis, most pronounced in the mid and lower lung zones. Borderline sized mediastinal lymph nodes, likely reactive. - PFT's  05/30/2016  FEV1 3.07 (101 % ) ratio 81  p 2 % improvement from saba p nothing  prior to study with DLCO  54/59 % corrects to 76 % for alv volume   - HRCT  01/24/2017 1. There is a spectrum of findings compatible with interstitial lung disease, an although the overall appearance is considered a "probable UIP CT pattern", the lack of progression compared to the prior study, lack of frank honeycombing and presence of air trapping raises the possibility of fibrotic phase nonspecific interstitial pneumonia (NSIP). Repeat high-resolution chest CT is suggested in 12 months to assess for temporal changes in the appearance of the lung parenchyma. 2. Aortic atherosclerosis, in addition to three-vessel coronary artery disease. - PFT's  11/25/2017   FVC 3.80 (91%) with DLCO  57 % corrects to 78 % for alv volume  - PFT's  12/11/2018 FVC 3.75 (91%)   with DLCO  60 % corrects to 73  % for alv volume   -  01/27/2020   Walked RA  2 laps @ approx 214ft each @ fast pace  stopped due to end of study, no sob with sats 89% at end  - HRCT ordered  He has had more cough since changed gerd rx and now desaturating ? Cause and effect  Use of PPI is associated with improved survival time and with decreased radiologic fibrosis per King's study published in AJRCCM vol 184 p1390.  Dec 2011 and also may have other beneficial effects as per the latest review in [place] vol 193 S1423 Jun 20016.  This may not always be cause and effect, but given how universally underwhelming  (at least in terms of short term benefit)   and expensive  all the other  Drugs developed to day  have been for pf,   rec  Resume max gerd rx and repeat HRCT  If evolving will need referral to PF clinic.  Discussed in detail all  the  indications, usual  risks and alternatives  relative to the benefits with patient who agrees to proceed with w/u as outlined.            Each maintenance medication was reviewed in detail including emphasizing most importantly the difference between maintenance and prns and under what circumstances the prns are to be triggered using an action plan format where appropriate.  Total time for H and P, chart review, counseling,  directly observing portions of ambulatory 02 saturation study/  and generating customized AVS unique to this office visit / charting =  24 min

## 2020-02-01 ENCOUNTER — Ambulatory Visit: Payer: Medicare Other | Admitting: Internal Medicine

## 2020-02-07 NOTE — Progress Notes (Signed)
Subjective:    Patient ID: Walter Hess, male    DOB: 06/19/1941, 79 y.o.   MRN: [id number]  HPI The patient is here for follow up of their chronic medical problems, including  Prediabetes, hyperlipidemia, CKD, pulm fibrosis, GERD  He is taking all of his medications as prescribed.   He is exercising regularly.   Walks treadmill.     His pulse ox would go to 88-89% with exercise.  He does get a little more tired when exercising.  He did see Dr Melvyn Novas.  A CT scan was ordered.    He has chronic hoarseness.      Medications and allergies reviewed with patient and updated if appropriate.  Patient Active Problem List   Diagnosis Date Noted  . Aortic atherosclerosis ([place]) 01/28/2018  . Decreased hearing 01/27/2017  . GERD (gastroesophageal reflux disease) 01/26/2017  . Cough 06/27/2016  . Sinusitis, chronic 02/29/2016  . Postinflammatory pulmonary fibrosis ([place]) 02/29/2016  . Chronic kidney disease 01/30/2016  . Lung nodule 01/28/2016  . Prediabetes 01/25/2016  . Vertebral basilar insufficiency 01/25/2016  . DUPUYTREN'S CONTRACTURE, RIGHT 10/05/2009  . HYPERLIPIDEMIA 06/24/2007  . DIVERTICULOSIS, COLON 06/24/2007  . PROSTATE SPECIFIC ANTIGEN( PSA), ELEVATED 06/24/2007  . NONSPECIFIC ABNORMAL ELECTROCARDIOGRAM 06/24/2007    Current Outpatient Medications on File Prior to Visit  Medication Sig Dispense Refill  . aspirin 81 MG tablet Take 81 mg by mouth daily.      . Cinnamon 500 MG TABS Take by mouth.    . Coenzyme Q10 (COQ10) 100 MG CAPS Take by mouth daily.    . famotidine (PEPCID) 20 MG tablet One after supper 90 tablet 3  . Multiple Vitamin (MULTIVITAMIN) tablet Take 1 tablet by mouth daily.      . pantoprazole (PROTONIX) 40 MG tablet Take 40 mg by mouth daily.    . rosuvastatin (CRESTOR) 5 MG tablet TAKE 1 TABLET BY MOUTH EVERY DAY 90 tablet 1  . S-Adenosylmethionine (SAM-E) 200 MG TABS Take by mouth daily.      . Saw Palmetto 450 MG CAPS Take by mouth daily.        . Turmeric 500 MG CAPS Take by mouth daily.       No current facility-administered medications on file prior to visit.    Past Medical History:  Diagnosis Date  . Anemia    former blood donor for decades; stopped 2006  . Diverticulosis 2005   Dr. Olevia Perches  . Elevated prostate specific antigen (PSA)    Dr. Amalia Hailey [facility] patient states due to infection  . Gilbert's syndrome   . Hyperlipidemia     Past Surgical History:  Procedure Laterality Date  . CATARACT EXTRACTION, BILATERAL  2017  . COLONOSCOPY  2005   Diverticulosis, Dr.Brodie.  Marland Kitchen GANGLION CYST EXCISION  2005   5th L digit  . LUMBAR LAMINECTOMY  1981    Social History   Socioeconomic History  . Marital status: Married    Spouse name: Not on file  . Number of children: 2  . Years of education: Not on file  . Highest education level: Not on file  Occupational History  . Occupation: retired  Tobacco Use  . Smoking status: Former Smoker    Packs/day: 0.50    Years: 5.00    Pack years: 2.50    Quit date: 06/25/1963    Years since quitting: 56.6  . Smokeless tobacco: Never Used  Vaping Use  . Vaping Use: Never used  Substance and Sexual  Activity  . Alcohol use: Yes    Alcohol/week: 0.0 standard drinks    Comment: beer occasionally  . Drug use: No  . Sexual activity: Not Currently  Other Topics Concern  . Not on file  Social History Narrative  . Not on file   Social Determinants of Health   Financial Resource Strain: Low Risk   . Difficulty of Paying Living Expenses: Not hard at all  Food Insecurity: No Food Insecurity  . Worried About Charity fundraiser in the Last Year: Never true  . Ran Out of Food in the Last Year: Never true  Transportation Needs: No Transportation Needs  . Lack of Transportation (Medical): No  . Lack of Transportation (Non-Medical): No  Physical Activity: Sufficiently Active  . Days of Exercise per Week: 6 days  . Minutes of Exercise per Session: 50 min  Stress: No Stress Concern  Present  . Feeling of Stress : Not at all  Social Connections: Socially Integrated  . Frequency of Communication with Friends and Family: More than three times a week  . Frequency of Social Gatherings with Friends and Family: More than three times a week  . Attends Religious Services: More than 4 times per year  . Active Member of Clubs or Organizations: Yes  . Attends Archivist Meetings: More than 4 times per year  . Marital Status: Married    Family History  Problem Relation Age of Onset  . Heart attack Father 59       former smoker  . Alzheimer's disease Mother   . Coronary artery disease Sister        open heart surgery  . Diabetes Paternal Aunt   . Heart attack Paternal Uncle 79  . Coronary artery disease Paternal Grandfather   . Prostate cancer Paternal Grandfather   . Prostate cancer Maternal Uncle   . Stroke Neg Hx   . Colon cancer Neg Hx     Review of Systems  Constitutional: Negative for fever.  Respiratory: Positive for cough (dry) and shortness of breath (at times with exertion). Negative for wheezing.   Cardiovascular: Negative for chest pain, palpitations and leg swelling.  Neurological: Negative for light-headedness and headaches.       Objective:   Vitals:   02/08/20 0838  BP: 122/80  Pulse: 80  Temp: 98.1 F (36.7 C)  SpO2: 94%   BP Readings from Last 3 Encounters:  02/08/20 122/80  01/27/20 126/74  08/04/19 (!) 142/72   Wt Readings from Last 3 Encounters:  02/08/20 159 lb (72.1 kg)  01/27/20 162 lb (73.5 kg)  08/04/19 160 lb (72.6 kg)   Body mass index is 22.81 kg/m.   Physical Exam    Constitutional: Appears well-developed and well-nourished. No distress.  HENT:  Head: Normocephalic and atraumatic.  Neck: Neck supple. No tracheal deviation present. No thyromegaly present.  No cervical lymphadenopathy Cardiovascular: Normal rate, regular rhythm and normal heart sounds.   No murmur heard. No carotid bruit .  No  edema Pulmonary/Chest: Effort normal . No respiratory distress. No has no wheezes. Dry bibasilar crackles.  Skin: Skin is warm and dry. Not diaphoretic.  Psychiatric: Normal mood and affect. Behavior is normal.      Assessment & Plan:    See Problem List for Assessment and Plan of chronic medical problems.    This visit occurred during the SARS-CoV-2 public health emergency.  Safety protocols were in place, including screening questions prior to the visit, additional usage of  staff PPE, and extensive cleaning of exam room while observing appropriate contact time as indicated for disinfecting solutions.

## 2020-02-07 NOTE — Patient Instructions (Addendum)
°  Blood work was ordered.   ° ° °Medications reviewed and updated.  Changes include :   none ° ° ° °Please followup in 6 months ° ° °

## 2020-02-08 ENCOUNTER — Ambulatory Visit (INDEPENDENT_AMBULATORY_CARE_PROVIDER_SITE_OTHER): Payer: Medicare Other | Admitting: Internal Medicine

## 2020-02-08 ENCOUNTER — Other Ambulatory Visit: Payer: Self-pay

## 2020-02-08 ENCOUNTER — Encounter: Payer: Self-pay | Admitting: Internal Medicine

## 2020-02-08 VITALS — BP 122/80 | HR 80 | Temp 98.1°F | Ht 70.0 in | Wt 159.0 lb

## 2020-02-08 DIAGNOSIS — N1831 Chronic kidney disease, stage 3a: Secondary | ICD-10-CM | POA: Diagnosis not present

## 2020-02-08 DIAGNOSIS — K219 Gastro-esophageal reflux disease without esophagitis: Secondary | ICD-10-CM

## 2020-02-08 DIAGNOSIS — E782 Mixed hyperlipidemia: Secondary | ICD-10-CM | POA: Diagnosis not present

## 2020-02-08 DIAGNOSIS — R7303 Prediabetes: Secondary | ICD-10-CM

## 2020-02-08 DIAGNOSIS — J8410 Pulmonary fibrosis, unspecified: Secondary | ICD-10-CM

## 2020-02-08 NOTE — Assessment & Plan Note (Signed)
Chronic No gerd symptoms ? Affecting lung function, causing cough Continue protonix, pepcid

## 2020-02-08 NOTE — Assessment & Plan Note (Signed)
Chronic cmp 

## 2020-02-08 NOTE — Assessment & Plan Note (Signed)
Chronic Check lipid panel  Continue daily statin Regular exercise and healthy diet encouraged  

## 2020-02-08 NOTE — Assessment & Plan Note (Signed)
Chronic Check a1c Low sugar / carb diet Stressed regular exercise  

## 2020-02-09 LAB — COMPLETE METABOLIC PANEL WITH GFR
AG Ratio: 1.7 (calc) (ref 1.0–2.5)
ALT: 11 U/L (ref 9–46)
AST: 20 U/L (ref 10–35)
Albumin: 4 g/dL (ref 3.6–5.1)
Alkaline phosphatase (APISO): 38 U/L (ref 35–144)
BUN/Creatinine Ratio: 13 (calc) (ref 6–22)
BUN: 16 mg/dL (ref 7–25)
CO2: 28 mmol/L (ref 20–32)
Calcium: 9.1 mg/dL (ref 8.6–10.3)
Chloride: 105 mmol/L (ref 98–110)
Creat: 1.21 mg/dL — ABNORMAL HIGH (ref 0.70–1.11)
GFR, Est African American: 65 mL/min/{1.73_m2} (ref >=60)
GFR, Est Non African American: 56 mL/min/{1.73_m2} — ABNORMAL LOW (ref >=60)
Globulin: 2.4 g/dL (calc) (ref 1.9–3.7)
Glucose, Bld: 123 mg/dL — ABNORMAL HIGH (ref 65–99)
Potassium: 4.3 mmol/L (ref 3.5–5.3)
Sodium: 139 mmol/L (ref 135–146)
Total Bilirubin: 1.3 mg/dL — ABNORMAL HIGH (ref 0.2–1.2)
Total Protein: 6.4 g/dL (ref 6.1–8.1)

## 2020-02-09 LAB — LIPID PANEL
Cholesterol: 97 mg/dL (ref <200)
HDL: 42 mg/dL (ref >=40)
LDL Cholesterol (Calc): 41 mg/dL (calc)
Non-HDL Cholesterol (Calc): 55 mg/dL (calc) (ref <130)
Total CHOL/HDL Ratio: 2.3 (calc) (ref <5.0)
Triglycerides: 65 mg/dL (ref <150)

## 2020-02-09 LAB — HEMOGLOBIN A1C
Hgb A1c MFr Bld: 6.2 % of total Hgb — ABNORMAL HIGH (ref <5.7)
Mean Plasma Glucose: 131 (calc)
eAG (mmol/L): 7.3 (calc)

## 2020-02-14 ENCOUNTER — Ambulatory Visit (HOSPITAL_COMMUNITY)
Admission: RE | Admit: 2020-02-14 | Discharge: 2020-02-14 | Disposition: A | Discharge status: Home / Self Care | Payer: Medicare Other | Source: Ambulatory Visit | Attending: Internal Medicine | Admitting: Internal Medicine

## 2020-02-14 ENCOUNTER — Other Ambulatory Visit: Payer: Self-pay

## 2020-02-14 DIAGNOSIS — J8410 Pulmonary fibrosis, unspecified: Secondary | ICD-10-CM

## 2020-02-14 DIAGNOSIS — R0602 Shortness of breath: Secondary | ICD-10-CM | POA: Diagnosis not present

## 2020-02-15 DIAGNOSIS — H903 Sensorineural hearing loss, bilateral: Secondary | ICD-10-CM | POA: Diagnosis not present

## 2020-02-15 NOTE — Progress Notes (Signed)
Spoke with pt and notified of results per Dr. Wert. Pt verbalized understanding and denied any questions. 

## 2020-02-24 ENCOUNTER — Other Ambulatory Visit: Payer: Self-pay

## 2020-02-24 ENCOUNTER — Ambulatory Visit (INDEPENDENT_AMBULATORY_CARE_PROVIDER_SITE_OTHER): Payer: Medicare Other

## 2020-02-24 VITALS — BP 118/60 | HR 69 | Temp 98.4°F | Resp 16 | Ht 70.0 in | Wt 161.0 lb

## 2020-02-24 DIAGNOSIS — Z0000 Encounter for general adult medical examination without abnormal findings: Secondary | ICD-10-CM | POA: Diagnosis not present

## 2020-02-24 NOTE — Progress Notes (Signed)
Subjective:   Walter Hess is a 79 y.o. male who presents for Medicare Annual/Subsequent preventive examination.  Review of Systems    NO ROS. Medicare Wellness Visit Cardiac Risk Factors include: advanced age (>31men, >25 women);dyslipidemia;family history of premature cardiovascular disease;male gender     Objective:    Today's Vitals   02/24/20 1023 02/24/20 1024  BP: 118/60   Pulse: 69   Resp: 16   Temp: 98.4 F (36.9 C)   SpO2: 95%   Weight: 161 lb (73 kg)   Height: 5\' 10"  (1.778 m)   PainSc: 0-No pain 0-No pain   Body mass index is 23.1 kg/m..  Advanced Directives 02/24/2020 02/23/2019 04/14/2014  Does Patient Have a Medical Advance Directive? Yes Yes Yes  Type of Advance Directive Living will;Healthcare Power of [name];Living will Living will;Healthcare Power of Attorney  Does patient want to make changes to medical advance directive? No - Patient declined - -  Copy of [place] in Chart? No - copy requested No - copy requested -    Current Medications (verified) Outpatient Encounter Medications as of 02/24/2020  Medication Sig  . aspirin 81 MG tablet Take 81 mg by mouth daily.    . betamethasone dipropionate 0.05 % cream Apply topically 2 (two) times daily as needed.  . Cinnamon 500 MG TABS Take by mouth.  . Coenzyme Q10 (COQ10) 100 MG CAPS Take by mouth daily.  . famotidine (PEPCID) 20 MG tablet One after supper  . Multiple Vitamin (MULTIVITAMIN) tablet Take 1 tablet by mouth daily.    . pantoprazole (PROTONIX) 40 MG tablet Take 40 mg by mouth daily.  . rosuvastatin (CRESTOR) 5 MG tablet TAKE 1 TABLET BY MOUTH EVERY DAY  . S-Adenosylmethionine (SAM-E) 200 MG TABS Take by mouth daily.    . Saw Palmetto 450 MG CAPS Take by mouth daily.    . Turmeric 500 MG CAPS Take by mouth daily.     No facility-administered encounter medications on file as of 02/24/2020.    Allergies (verified) Patient has no known  allergies.   History: Past Medical History:  Diagnosis Date  . Anemia    former blood donor for decades; stopped 2006  . Diverticulosis 2005   Dr. Olevia Perches  . Elevated prostate specific antigen (PSA)    Dr. Amalia Hailey [facility] patient states due to infection  . Gilbert's syndrome   . Hyperlipidemia    Past Surgical History:  Procedure Laterality Date  . CATARACT EXTRACTION, BILATERAL  2017  . COLONOSCOPY  2005   Diverticulosis, Dr.Brodie.  Marland Kitchen GANGLION CYST EXCISION  2005   5th L digit  . LUMBAR LAMINECTOMY  1981   Family History  Problem Relation Age of Onset  . Heart attack Father 16       former smoker  . Alzheimer's disease Mother   . Coronary artery disease Sister        open heart surgery  . Diabetes Paternal Aunt   . Heart attack Paternal Uncle 65  . Coronary artery disease Paternal Grandfather   . Prostate cancer Paternal Grandfather   . Prostate cancer Maternal Uncle   . Stroke Neg Hx   . Colon cancer Neg Hx    Social History   Socioeconomic History  . Marital status: Married    Spouse name: Not on file  . Number of children: 2  . Years of education: Not on file  . Highest education level: Not on file  Occupational History  . Occupation: retired  Tobacco Use  . Smoking status: Former Smoker    Packs/day: 0.50    Years: 5.00    Pack years: 2.50    Quit date: 06/25/1963    Years since quitting: 56.7  . Smokeless tobacco: Never Used  Vaping Use  . Vaping Use: Never used  Substance and Sexual Activity  . Alcohol use: Yes    Alcohol/week: 0.0 standard drinks    Comment: beer occasionally  . Drug use: No  . Sexual activity: Not Currently  Other Topics Concern  . Not on file  Social History Narrative  . Not on file   Social Determinants of Health   Financial Resource Strain: Low Risk   . Difficulty of Paying Living Expenses: Not hard at all  Food Insecurity: No Food Insecurity  . Worried About Charity fundraiser in the Last Year: Never true  . Ran Out  of Food in the Last Year: Never true  Transportation Needs: No Transportation Needs  . Lack of Transportation (Medical): No  . Lack of Transportation (Non-Medical): No  Physical Activity: Sufficiently Active  . Days of Exercise per Week: 7 days  . Minutes of Exercise per Session: 30 min  Stress: No Stress Concern Present  . Feeling of Stress : Not at all  Social Connections: Socially Integrated  . Frequency of Communication with Friends and Family: More than three times a week  . Frequency of Social Gatherings with Friends and Family: More than three times a week  . Attends Religious Services: More than 4 times per year  . Active Member of Clubs or Organizations: Yes  . Attends Archivist Meetings: More than 4 times per year  . Marital Status: Married    Tobacco Counseling Counseling given: Not Answered   Clinical Intake:  Pre-visit preparation completed: Yes  Pain : No/denies pain Pain Score: 0-No pain     BMI - recorded: 23.1 Nutritional Status: BMI of 19-24  Normal Nutritional Risks: None Diabetes: No  How often do you need to have someone help you when you read instructions, pamphlets, or other written materials from your doctor or pharmacy?: 1 - Never What is the last grade level you completed in school?: Bachelor's Degree from [employer]  Diabetic? no  Interpreter Needed?: No  Information entered by :: Lamonica Trueba N. Emmi Wertheim, LPN   Activities of Daily Living In your present state of health, do you have any difficulty performing the following activities: 02/24/2020  Hearing? Y  Comment wears hearing aids  Vision? N  Difficulty concentrating or making decisions? Y  Comment with names  Walking or climbing stairs? N  Dressing or bathing? N  Doing errands, shopping? N  Preparing Food and eating ? N  Using the Toilet? N  In the past six months, have you accidently leaked urine? N  Do you have problems with loss of bowel control? N  Managing your  Medications? N  Managing your Finances? N  Housekeeping or managing your Housekeeping? N  Some recent data might be hidden    Patient Care Team: Binnie Rail, MD as PCP - General (Internal Medicine) Tanda Rockers, MD as Consulting Physician (Pulmonary Disease) Domingo Pulse, MD (Urology)  Indicate any recent Medical Services you may have received from other than [facility] providers in the past year (date may be approximate).     Assessment:   This is a routine wellness examination for Elton.  Hearing/Vision screen No exam data  present  Dietary issues and exercise activities discussed: Current Exercise Habits: Home exercise routine, Type of exercise: strength training/weights;stretching;treadmill;walking, Time (Minutes): 30, Frequency (Times/Week): 7, Weekly Exercise (Minutes/Week): 210, Intensity: Mild, Exercise limited by: respiratory conditions(s)  Goals    .  Patient Stated      Continue to exercise, eat healthy, enjoy life and family.    .  Patient Stated (pt-stated)      My goal is to continue to exercise and listen to my body more.      Depression Screen PHQ 2/9 Scores 02/24/2020 02/23/2019 02/01/2019 08/03/2018 01/28/2018 01/13/2013  PHQ - 2 Score 0 0 0 0 0 0    Fall Risk Fall Risk  02/24/2020 08/04/2019 02/23/2019 02/01/2019 08/03/2018  Falls in the past year? 0 0 0 0 0  Comment - - - - -  Number falls in past yr: 0 0 0 0 -  Injury with Fall? 0 - 0 - -  Risk for fall due to : No Fall Risks - - - -  Follow up Falls evaluation completed - - - -    Any stairs in or around the home? Yes  If so, are there any without handrails? No  Home free of loose throw rugs in walkways, pet beds, electrical cords, etc? Yes  Adequate lighting in your home to reduce risk of falls? Yes   ASSISTIVE DEVICES UTILIZED TO PREVENT FALLS:  Life alert? No  Use of a cane, walker or w/c? No  Grab bars in the bathroom? No  Shower chair or bench in shower? Yes  Elevated toilet seat or a handicapped  toilet? No   TIMED UP AND GO:  Was the test performed? No .  Length of time to ambulate 10 feet: 0 sec.   Gait steady and fast without use of assistive device  Cognitive Function:     6CIT Screen 02/24/2020  What Year? 0 points  What month? 0 points  What time? 0 points  Count back from 20 0 points  Months in reverse 0 points  Repeat phrase 0 points  Total Score 0    Immunizations Immunization History  Administered Date(s) Administered  . Fluad Quad(high Dose 65+) 02/26/2019, 03/25/2019  . Influenza Split 04/03/2011, 04/08/2012  . Influenza Whole 04/07/2007, 03/31/2008, 03/22/2009, 03/27/2010  . Influenza, High Dose Seasonal PF 03/25/2013, 03/23/2014, 03/14/2017, 03/20/2018  . Influenza-Unspecified 03/14/2015, 02/08/2016, 03/14/2017  . PFIZER SARS-COV-2 Vaccination 07/07/2019, 07/27/2019  . Pneumococcal Conjugate-13 01/23/2015  . Pneumococcal Polysaccharide-23 10/05/2009  . Tdap 04/26/2004  . Tetanus 07/01/2014  . Zoster 06/25/2011    TDAP status: Due, Education has been provided regarding the importance of this vaccine. Advised may receive this vaccine at local pharmacy or Health Dept. Aware to provide a copy of the vaccination record if obtained from local pharmacy or Health Dept. Verbalized acceptance and understanding. Flu Vaccine status: Up to date Pneumococcal vaccine status: Up to date Covid-19 vaccine status: Completed vaccines  Qualifies for Shingles Vaccine? Yes   Zostavax completed Yes   Shingrix Completed?: No.    Education has been provided regarding the importance of this vaccine. Patient has been advised to call insurance company to determine out of pocket expense if they have not yet received this vaccine. Advised may also receive vaccine at local pharmacy or Health Dept. Verbalized acceptance and understanding.  Screening Tests Health Maintenance  Topic Date Due  . INFLUENZA VACCINE  01/23/2020  . TETANUS/TDAP  07/01/2024  . COVID-19 Vaccine   Completed  . PNA  vac Low Risk Adult  Completed    Health Maintenance  Health Maintenance Due  Topic Date Due  . INFLUENZA VACCINE  01/23/2020    Colorectal cancer screening: No longer required.   Lung Cancer Screening: (Low Dose CT Chest recommended if Age 51-80 years, 30 pack-year currently smoking OR have quit w/in 15years.) does not qualify.   Lung Cancer Screening Referral: no  Additional Screening:  Hepatitis C Screening: does not qualify; Completed no  Vision Screening: Recommended annual ophthalmology exams for early detection of glaucoma and other disorders of the eye. Is the patient up to date with their annual eye exam?  Yes  Who is the provider or what is the name of the office in which the patient attends annual eye exams? [facility] If pt is not established with a provider, would they like to be referred to a provider to establish care? No .   Dental Screening: Recommended annual dental exams for proper oral hygiene  Community Resource Referral / Chronic Care Management: CRR required this visit?  No   CCM required this visit?  No      Plan:     I have personally reviewed and noted the following in the patient's chart:   . Medical and social history . Use of alcohol, tobacco or illicit drugs  . Current medications and supplements . Functional ability and status . Nutritional status . Physical activity . Advanced directives . List of other physicians . Hospitalizations, surgeries, and ER visits in previous 12 months . Vitals . Screenings to include cognitive, depression, and falls . Referrals and appointments  In addition, I have reviewed and discussed with patient certain preventive protocols, quality metrics, and best practice recommendations. A written personalized care plan for preventive services as well as general preventive health recommendations were provided to patient.     Sheral Flow, LPN   03/01/9210   Nurse Notes:  n/a

## 2020-02-24 NOTE — Patient Instructions (Signed)
Walter Hess , Thank you for taking time to come for your Medicare Wellness Visit. I appreciate your ongoing commitment to your health goals. Please review the following plan we discussed and let me know if I can assist you in the future.   Screening recommendations/referrals: Colonoscopy: 04/26/2014; no repeat due to age Recommended yearly ophthalmology/optometry visit for glaucoma screening and checkup Recommended yearly dental visit for hygiene and checkup  Vaccinations: Influenza vaccine: 03/25/2019 Pneumococcal vaccine: completed Tdap vaccine: 04/26/2004; due every 10 years; overdue Shingles vaccine: never done; will check with local pharmacy Covid-19: completed Pfizer vaccine  Advanced directives: Please bring a copy of your health care power of attorney and living will to the office at your convenience.  Conditions/risks identified: Yes; Reviewed health maintenance screenings with patient today and relevant education, vaccines, and/or referrals were provided. Please continue to do your personal lifestyle choices by: daily care of teeth and gums, regular physical activity (goal should be 5 days a week for 30 minutes), eat a healthy diet, avoid tobacco and drug use, limiting any alcohol intake, taking a low-dose aspirin (if not allergic or have been advised by your provider otherwise) and taking vitamins and minerals as recommended by your provider. Continue doing brain stimulating activities (puzzles, reading, adult coloring books, staying active) to keep memory sharp. Continue to eat heart healthy diet (full of fruits, vegetables, whole grains, lean protein, water--limit salt, fat, and sugar intake) and increase physical activity as tolerated.  Next appointment: Please schedule your next Medicare Wellness Visit with your Nurse Health Advisor in 1 year.  Preventive Care 81 Years and Older, Male Preventive care refers to lifestyle choices and visits with your health care provider that can  promote health and wellness. What does preventive care include?  A yearly physical exam. This is also called an annual well check.  Dental exams once or twice a year.  Routine eye exams. Ask your health care provider how often you should have your eyes checked.  Personal lifestyle choices, including:  Daily care of your teeth and gums.  Regular physical activity.  Eating a healthy diet.  Avoiding tobacco and drug use.  Limiting alcohol use.  Practicing safe sex.  Taking low doses of aspirin every day.  Taking vitamin and mineral supplements as recommended by your health care provider. What happens during an annual well check? The services and screenings done by your health care provider during your annual well check will depend on your age, overall health, lifestyle risk factors, and family history of disease. Counseling  Your health care provider may ask you questions about your:  Alcohol use.  Tobacco use.  Drug use.  Emotional well-being.  Home and relationship well-being.  Sexual activity.  Eating habits.  History of falls.  Memory and ability to understand (cognition).  Work and work Statistician. Screening  You may have the following tests or measurements:  Height, weight, and BMI.  Blood pressure.  Lipid and cholesterol levels. These may be checked every 5 years, or more frequently if you are over 50 years old.  Skin check.  Lung cancer screening. You may have this screening every year starting at age 67 if you have a 30-pack-year history of smoking and currently smoke or have quit within the past 15 years.  Fecal occult blood test (FOBT) of the stool. You may have this test every year starting at age 51.  Flexible sigmoidoscopy or colonoscopy. You may have a sigmoidoscopy every 5 years or a colonoscopy every 10 years  starting at age 86.  Prostate cancer screening. Recommendations will vary depending on your family history and other  risks.  Hepatitis C blood test.  Hepatitis B blood test.  Sexually transmitted disease (STD) testing.  Diabetes screening. This is done by checking your blood sugar (glucose) after you have not eaten for a while (fasting). You may have this done every 1-3 years.  Abdominal aortic aneurysm (AAA) screening. You may need this if you are a current or former smoker.  Osteoporosis. You may be screened starting at age 51 if you are at high risk. Talk with your health care provider about your test results, treatment options, and if necessary, the need for more tests. Vaccines  Your health care provider may recommend certain vaccines, such as:  Influenza vaccine. This is recommended every year.  Tetanus, diphtheria, and acellular pertussis (Tdap, Td) vaccine. You may need a Td booster every 10 years.  Zoster vaccine. You may need this after age 95.  Pneumococcal 13-valent conjugate (PCV13) vaccine. One dose is recommended after age 55.  Pneumococcal polysaccharide (PPSV23) vaccine. One dose is recommended after age 64. Talk to your health care provider about which screenings and vaccines you need and how often you need them. This information is not intended to replace advice given to you by your health care provider. Make sure you discuss any questions you have with your health care provider. Document Released: 07/07/2015 Document Revised: 02/28/2016 Document Reviewed: 04/11/2015 Elsevier Interactive Patient Education  2017 [place] Prevention in the Home Falls can cause injuries. They can happen to people of all ages. There are many things you can do to make your home safe and to help prevent falls. What can I do on the outside of my home?  Regularly fix the edges of walkways and driveways and fix any cracks.  Remove anything that might make you trip as you walk through a door, such as a raised step or threshold.  Trim any bushes or trees on the path to your home.  Use  bright outdoor lighting.  Clear any walking paths of anything that might make someone trip, such as rocks or tools.  Regularly check to see if handrails are loose or broken. Make sure that both sides of any steps have handrails.  Any raised decks and porches should have guardrails on the edges.  Have any leaves, snow, or ice cleared regularly.  Use sand or salt on walking paths during winter.  Clean up any spills in your garage right away. This includes oil or grease spills. What can I do in the bathroom?  Use night lights.  Install grab bars by the toilet and in the tub and shower. Do not use towel bars as grab bars.  Use non-skid mats or decals in the tub or shower.  If you need to sit down in the shower, use a plastic, non-slip stool.  Keep the floor dry. Clean up any water that spills on the floor as soon as it happens.  Remove soap buildup in the tub or shower regularly.  Attach bath mats securely with double-sided non-slip rug tape.  Do not have throw rugs and other things on the floor that can make you trip. What can I do in the bedroom?  Use night lights.  Make sure that you have a light by your bed that is easy to reach.  Do not use any sheets or blankets that are too big for your bed. They should not hang down  onto the floor.  Have a firm chair that has side arms. You can use this for support while you get dressed.  Do not have throw rugs and other things on the floor that can make you trip. What can I do in the kitchen?  Clean up any spills right away.  Avoid walking on wet floors.  Keep items that you use a lot in easy-to-reach places.  If you need to reach something above you, use a strong step stool that has a grab bar.  Keep electrical cords out of the way.  Do not use floor polish or wax that makes floors slippery. If you must use wax, use non-skid floor wax.  Do not have throw rugs and other things on the floor that can make you trip. What can  I do with my stairs?  Do not leave any items on the stairs.  Make sure that there are handrails on both sides of the stairs and use them. Fix handrails that are broken or loose. Make sure that handrails are as long as the stairways.  Check any carpeting to make sure that it is firmly attached to the stairs. Fix any carpet that is loose or worn.  Avoid having throw rugs at the top or bottom of the stairs. If you do have throw rugs, attach them to the floor with carpet tape.  Make sure that you have a light switch at the top of the stairs and the bottom of the stairs. If you do not have them, ask someone to add them for you. What else can I do to help prevent falls?  Wear shoes that:  Do not have high heels.  Have rubber bottoms.  Are comfortable and fit you well.  Are closed at the toe. Do not wear sandals.  If you use a stepladder:  Make sure that it is fully opened. Do not climb a closed stepladder.  Make sure that both sides of the stepladder are locked into place.  Ask someone to hold it for you, if possible.  Clearly mark and make sure that you can see:  Any grab bars or handrails.  First and last steps.  Where the edge of each step is.  Use tools that help you move around (mobility aids) if they are needed. These include:  Canes.  Walkers.  Scooters.  Crutches.  Turn on the lights when you go into a dark area. Replace any light bulbs as soon as they burn out.  Set up your furniture so you have a clear path. Avoid moving your furniture around.  If any of your floors are uneven, fix them.  If there are any pets around you, be aware of where they are.  Review your medicines with your doctor. Some medicines can make you feel dizzy. This can increase your chance of falling. Ask your doctor what other things that you can do to help prevent falls. This information is not intended to replace advice given to you by your health care provider. Make sure you  discuss any questions you have with your health care provider. Document Released: 04/06/2009 Document Revised: 11/16/2015 Document Reviewed: 07/15/2014 Elsevier Interactive Patient Education  2017 [employer].

## 2020-03-14 DIAGNOSIS — Z23 Encounter for immunization: Secondary | ICD-10-CM | POA: Diagnosis not present

## 2020-03-22 DIAGNOSIS — Z23 Encounter for immunization: Secondary | ICD-10-CM | POA: Diagnosis not present

## 2020-03-23 DIAGNOSIS — L821 Other seborrheic keratosis: Secondary | ICD-10-CM | POA: Diagnosis not present

## 2020-03-23 DIAGNOSIS — L814 Other melanin hyperpigmentation: Secondary | ICD-10-CM | POA: Diagnosis not present

## 2020-03-23 DIAGNOSIS — D1801 Hemangioma of skin and subcutaneous tissue: Secondary | ICD-10-CM | POA: Diagnosis not present

## 2020-03-23 DIAGNOSIS — L570 Actinic keratosis: Secondary | ICD-10-CM | POA: Diagnosis not present

## 2020-03-23 DIAGNOSIS — L400 Psoriasis vulgaris: Secondary | ICD-10-CM | POA: Diagnosis not present

## 2020-04-20 DIAGNOSIS — H5203 Hypermetropia, bilateral: Secondary | ICD-10-CM | POA: Diagnosis not present

## 2020-04-20 DIAGNOSIS — H524 Presbyopia: Secondary | ICD-10-CM | POA: Diagnosis not present

## 2020-04-20 DIAGNOSIS — H52223 Regular astigmatism, bilateral: Secondary | ICD-10-CM | POA: Diagnosis not present

## 2020-04-20 DIAGNOSIS — H5053 Vertical heterophoria: Secondary | ICD-10-CM | POA: Diagnosis not present

## 2020-04-20 DIAGNOSIS — H5022 Vertical strabismus, left eye: Secondary | ICD-10-CM | POA: Diagnosis not present

## 2020-04-20 DIAGNOSIS — H50122 Monocular exotropia with A pattern, left eye: Secondary | ICD-10-CM | POA: Diagnosis not present

## 2020-04-20 DIAGNOSIS — H50021 Monocular esotropia with A pattern, right eye: Secondary | ICD-10-CM | POA: Diagnosis not present

## 2020-04-21 ENCOUNTER — Other Ambulatory Visit: Payer: Self-pay | Admitting: Internal Medicine

## 2020-07-18 ENCOUNTER — Other Ambulatory Visit: Payer: Self-pay | Admitting: Internal Medicine

## 2020-07-18 DIAGNOSIS — J8410 Pulmonary fibrosis, unspecified: Secondary | ICD-10-CM

## 2020-08-01 ENCOUNTER — Ambulatory Visit: Payer: Medicare Other | Admitting: Internal Medicine

## 2020-08-02 DIAGNOSIS — K045 Chronic apical periodontitis: Secondary | ICD-10-CM | POA: Diagnosis not present

## 2020-08-03 ENCOUNTER — Telehealth: Payer: Self-pay | Admitting: Internal Medicine

## 2020-08-03 DIAGNOSIS — N1831 Chronic kidney disease, stage 3a: Secondary | ICD-10-CM

## 2020-08-03 DIAGNOSIS — E782 Mixed hyperlipidemia: Secondary | ICD-10-CM

## 2020-08-03 DIAGNOSIS — R7303 Prediabetes: Secondary | ICD-10-CM

## 2020-08-03 NOTE — Telephone Encounter (Signed)
Patient wondering if he can get his labs done before his appointment on 02.21.22

## 2020-08-03 NOTE — Telephone Encounter (Signed)
Lab appointment scheduled 

## 2020-08-03 NOTE — Telephone Encounter (Signed)
ordered

## 2020-08-07 ENCOUNTER — Encounter: Payer: Self-pay | Admitting: Internal Medicine

## 2020-08-07 ENCOUNTER — Other Ambulatory Visit: Payer: Self-pay

## 2020-08-07 ENCOUNTER — Ambulatory Visit (INDEPENDENT_AMBULATORY_CARE_PROVIDER_SITE_OTHER): Payer: Medicare Other | Admitting: Internal Medicine

## 2020-08-07 DIAGNOSIS — J8410 Pulmonary fibrosis, unspecified: Secondary | ICD-10-CM

## 2020-08-07 NOTE — Assessment & Plan Note (Addendum)
Incidental finding/ pt asymptomatic Ct scan 01/2016:  No suspicious pulmonary nodules. Changes of pulmonary fibrosis, most pronounced in the mid and lower lung zones. Borderline sized mediastinal lymph nodes, likely reactive. - PFT's  05/30/2016  FEV1 3.07 (101 % ) ratio 81  p 2 % improvement from saba p nothing  prior to study with DLCO  54/59 % corrects to 76 % for alv volume   - HRCT  01/24/2017 1. There is a spectrum of findings compatible with interstitial lung disease, an although the overall appearance is considered a "probable UIP CT pattern", the lack of progression compared to the prior study, lack of frank honeycombing and presence of air trapping raises the possibility of fibrotic phase nonspecific interstitial pneumonia (NSIP). Repeat high-resolution chest CT is suggested in 12 months to assess for temporal changes in the appearance of the lung parenchyma. 2. Aortic atherosclerosis, in addition to three-vessel coronary artery disease. - PFT's  11/25/2017   FVC 3.80 (91%) with DLCO  57 % corrects to 78 % for alv volume  - PFT's  12/11/2018 FVC 3.75 (91%)   with DLCO  60 % corrects to 73  % for alv volume   -  01/27/2020   Walked RA  2 laps @ approx 210ft each @ fast pace  stopped due to end of study, no sob with sats 89% at end  - HRCT 02/14/2020   Minimal change UIP pattern  -  08/07/2020   Walked RA  2 laps @ approx 233ft each @ fast pace  stopped due to end of study,  no sob   And 93% at end   Extremely benign course without typical UIP features so probably some form of NSIP for which there is no obvious assoc collagen vasc process so reasonalbe to just follow serial sats and f/u in 6 m  In meantime encouraged: Make sure you check your oxygen saturation at your highest level of activity to be sure it stays over 90% and keep track of it at least once a week, more often if breathing getting worse, and let me know if losing ground.   Discussed in detail all the  indications, usual  risks  and alternatives  relative to the benefits with patient who agrees to proceed with conservative f/u as outlined    Each maintenance medication was reviewed in detail including emphasizing most importantly the difference between maintenance and prns and under what circumstances the prns are to be triggered using an action plan format where appropriate.  Total time for H and P, chart review, counseling,  directly observing portions of ambulatory 02 saturation study/ and generating customized AVS unique to this office visit / same day charting = 34 min

## 2020-08-07 NOTE — Patient Instructions (Signed)
We will walk you today to check your saturations at peak exercise  Make sure you check your oxygen saturation at your highest level of activity to be sure it stays over 90% and keep track of it at least once a week, more often if breathing getting worse, and let me know if losing ground.    Please schedule a follow up visit in 6  months but call sooner if needed

## 2020-08-07 NOTE — Progress Notes (Signed)
Subjective:     Patient ID: Walter Hess, male   DOB: 04-Jun-1941     MRN: [id number]    Brief patient profile:  69  yowm never regular smoker stopped completely  in his 20's and no lower resp problems just sinus symptoms starting in 40s with recurrent infections  but no need for  specialty eval or need for rx then routine cxr suggested lung nodule in 01/2015 repeat in 01/2016 suggested PF so referred to pulmonary clinic 02/29/2016 by Dr  Quay Burow with dx of non UIP PF    History of Present Illness  02/29/2016 1st [facility] office visit/ Walter Hess   Chief Complaint  Patient presents with  . Pulmonary Consult    Referred by Dr. Billey Gosling for abnormal cxr. Pt denies any respiratory co's today.    6 days a week does 5 degrees of elevation at 3-4 mph x 25 min s sob x 40 year pattern s change rec Stay as active as you can and let me know if you are  losing ground with exercise or your oxygen saturation is dropping below your baseline   - above 90% is normal - pulse oximeter is the name of the instrument    05/30/2016  f/u ov/Walter Hess re: pf/ no change ex tol  Chief Complaint  Patient presents with  . Follow-up    Breathing is unchanged. No new co's today. PFT's done.   with peak exercise on his home treamill no limiting sob and  never less than 94% sats rec Pantoprazole (protonix) 40 mg   Take  30-60 min before first meal of the day and Pepcid (famotidine)  20 mg one @  bedtime until return to office - this is the best way to tell whether stomach acid is contributing to your problem.  Stop fish oil and eat more fish  GERD diet    06/27/2016 acute extended ov/Walter Hess re: acute cough in pt with underlying PF maint rx ppi/h2hs  Chief Complaint  Patient presents with  . Acute Visit    Pt c/o cough and chest congestion- onset 5 days ago.  He has had some dark colored nasal d/c.    cc chronic nasal congestion for which he uses nettipot nightly x years but then much worse  Abruptly x one week prior to  OV   then started with cough 2 days p flare of nasal symptoms with nasal secrtions and sputum turning dark grey/green but no fever or sob   rec For cough > mucinex dm up to 1200 mg every 12 hours as needed  Augmentin 875 mg take one pill twice daily  X 10 days - take at breakfast and supper with large glass of water.  It would help reduce the usual side effects (diarrhea and yeast infections) if you ate cultured yogurt at lunch.  Please remember to go to the  x-ray department downstairs for your tests - we will call you with the results when they are available.   11/25/2016  f/u ov/Walter Hess re:  PF / cough resolved on gerd rx  Chief Complaint  Patient presents with  . Follow-up    Cough has resolved. No new co's.    6 days per week, treadmill at 5 degrees x 4.5 mph x 25 min with sats 92% and no change ex tol or sats  No cough at all  rec No change in acid suppression  Please see patient coordinator before you leave today  to schedule HRCT in  2 months        11/25/2017  f/u ov/Walter Hess re: PF  Non dx HRCT only rx is for gerd Chief Complaint  Patient presents with  . Follow-up    Breathing is unchanged since last OV. Pt does not have any complaints today. PFT was performed.  Dyspnea:  No change in sats with ex  Cough: sporadic, not with ex Sleep: fine rec Continue your exercise program and let me know if you note any decline in 02 sats with exercise over time      01/27/2020  f/u ov/Walter Hess re:  PF/ non dx HRCT  Chief Complaint  Patient presents with  . Follow-up    Pt states has noticed decreased o2 sats 88-89%ra after walks on his treadmill. He also c/o minimal fatigue and hoarseness.   Dyspnea:  Changed treadmill after wore out the hold one  Cough: more raspy on protonix 40 mg ac / off pepcid since first of year  Sleeping: ok with 4 in blocks  SABA use: none  02: none  rec Add back pepcid (famotidine) after supper  or at bedtime  We will contact you regarding a CT chest and call the  results  Make sure you check your oxygen saturations at highest level of activity     08/07/2020  f/u ov/Walter Hess re:  PF  Chief Complaint  Patient presents with  . Follow-up    Breathing has improved some since his last visit and he feels like he has more energy. No new co's.   Dyspnea:  Treadmill 6 x per week x 20 min  X 4% grade x 3 mph with sats 92-93% Cough: none  Sleeping: bed blocks, one pillow  SABA use: none  02: none  Covid status:   Max vax    No obvious day to day or daytime variability or assoc excess/ purulent sputum or mucus plugs or hemoptysis or cp or chest tightness, subjective wheeze or overt sinus or hb symptoms.   sleeping without nocturnal  or early am exacerbation  of respiratory  c/o's or need for noct saba. Also denies any obvious fluctuation of symptoms with weather or environmental changes or other aggravating or alleviating factors except as outlined above   No unusual exposure hx or h/o childhood pna/ asthma or knowledge of premature birth.  Current Allergies, Complete Past Medical History, Past Surgical History, Family History, and Social History were reviewed in [employer] record.  ROS  The following are not active complaints unless bolded Hoarseness, sore throat, dysphagia, dental problems, itching, sneezing,  nasal congestion or discharge of excess mucus or purulent secretions, ear ache,   fever, chills, sweats, unintended wt loss or wt gain, classically pleuritic or exertional cp,  orthopnea pnd or arm/hand swelling  or leg swelling, presyncope, palpitations, abdominal pain, anorexia, nausea, vomiting, diarrhea  or change in bowel habits or change in bladder habits, change in stools or change in urine, dysuria, hematuria,  rash, arthralgias, visual complaints, headache, numbness, weakness or ataxia or problems with walking or coordination,  change in mood or  memory.        Current Meds  Medication Sig  . aspirin 81 MG tablet Take  81 mg by mouth daily.  . betamethasone dipropionate 0.05 % cream Apply topically 2 (two) times daily as needed.  . Cinnamon 500 MG TABS Take by mouth.  . Coenzyme Q10 (COQ10) 100 MG CAPS Take by mouth daily.  . famotidine (PEPCID) 20 MG tablet One after  supper  . Multiple Vitamin (MULTIVITAMIN) tablet Take 1 tablet by mouth daily.  . pantoprazole (PROTONIX) 40 MG tablet TAKE 1 TABLET EVERY DAY 30-60 MINUTES BEFORE THE 1ST MEAL OF THE DAY  . rosuvastatin (CRESTOR) 5 MG tablet TAKE 1 TABLET BY MOUTH EVERY DAY  . S-Adenosylmethionine (SAM-E) 200 MG TABS Take by mouth daily.  . Saw Palmetto 450 MG CAPS Take by mouth daily.  . Turmeric 500 MG CAPS Take by mouth daily.                           Objective:   Physical Exam   08/07/2020        161   01/27/2020           162  12/11/2018        160 11/25/2017          165  05/27/2017        166  11/25/2016          167  06/27/2016          163 05/30/2016        168   02/29/16 169 lb 12.8 oz (77 kg)  01/25/16 160 lb 8 oz (72.8 kg)  01/23/15 170 lb (77.1 kg)     Vital signs reviewed  08/07/2020  - Note at rest 02 sats  100% on RA   General appearance:   Pleasant amb wm nad   HEENT : pt wearing mask not removed for exam due to covid -19 concerns.    NECK :  without JVD/Nodes/TM/ nl carotid upstrokes bilaterally   LUNGS: no acc muscle use,  Nl contour chest with trace insp crackles bilaterally without cough on insp or exp maneuvers   CV:  RRR  no s3 or murmur or increase in P2, and no edema   ABD:  soft and nontender with nl inspiratory excursion in the supine position. No bruits or organomegaly appreciated, bowel sounds nl  MS:  Nl gait/ ext warm with R >> L duptryen's  deformities, calf tenderness, cyanosis or clubbing No obvious joint restrictions   SKIN: warm and dry without lesions    NEURO:  alert, approp, nl sensorium with  no motor or cerebellar deficits apparent.                Assessment:

## 2020-08-08 ENCOUNTER — Other Ambulatory Visit: Payer: Self-pay

## 2020-08-08 ENCOUNTER — Other Ambulatory Visit (INDEPENDENT_AMBULATORY_CARE_PROVIDER_SITE_OTHER): Payer: Medicare Other

## 2020-08-08 DIAGNOSIS — N1831 Chronic kidney disease, stage 3a: Secondary | ICD-10-CM

## 2020-08-08 DIAGNOSIS — R7303 Prediabetes: Secondary | ICD-10-CM

## 2020-08-08 DIAGNOSIS — E782 Mixed hyperlipidemia: Secondary | ICD-10-CM | POA: Diagnosis not present

## 2020-08-08 LAB — LIPID PANEL
Cholesterol: 100 mg/dL (ref 0–200)
HDL: 43.4 mg/dL (ref >39.00)
LDL Cholesterol: 44 mg/dL (ref 0–99)
NonHDL: 56.54
Total CHOL/HDL Ratio: 2
Triglycerides: 61 mg/dL (ref 0.0–149.0)
VLDL: 12.2 mg/dL (ref 0.0–40.0)

## 2020-08-08 LAB — COMPREHENSIVE METABOLIC PANEL
ALT: 12 U/L (ref 0–53)
AST: 20 U/L (ref 0–37)
Albumin: 4 g/dL (ref 3.5–5.2)
Alkaline Phosphatase: 38 U/L — ABNORMAL LOW (ref 39–117)
BUN: 13 mg/dL (ref 6–23)
CO2: 31 mEq/L (ref 19–32)
Calcium: 9.1 mg/dL (ref 8.4–10.5)
Chloride: 104 mEq/L (ref 96–112)
Creatinine, Ser: 1.2 mg/dL (ref 0.40–1.50)
GFR: 56.47 mL/min — ABNORMAL LOW (ref >60.00)
Glucose, Bld: 124 mg/dL — ABNORMAL HIGH (ref 70–99)
Potassium: 4.7 mEq/L (ref 3.5–5.1)
Sodium: 139 mEq/L (ref 135–145)
Total Bilirubin: 0.9 mg/dL (ref 0.2–1.2)
Total Protein: 7 g/dL (ref 6.0–8.3)

## 2020-08-08 LAB — CBC WITH DIFFERENTIAL/PLATELET
Basophils Absolute: 0 10*3/uL (ref 0.0–0.1)
Basophils Relative: 0.6 % (ref 0.0–3.0)
Eosinophils Absolute: 0.3 10*3/uL (ref 0.0–0.7)
Eosinophils Relative: 5.3 % — ABNORMAL HIGH (ref 0.0–5.0)
HCT: 36.7 % — ABNORMAL LOW (ref 39.0–52.0)
Hemoglobin: 12.7 g/dL — ABNORMAL LOW (ref 13.0–17.0)
Lymphocytes Relative: 21.2 % (ref 12.0–46.0)
Lymphs Abs: 1.1 10*3/uL (ref 0.7–4.0)
MCHC: 34.6 g/dL (ref 30.0–36.0)
MCV: 95.8 fl (ref 78.0–100.0)
Monocytes Absolute: 0.6 10*3/uL (ref 0.1–1.0)
Monocytes Relative: 11.8 % (ref 3.0–12.0)
Neutro Abs: 3.3 10*3/uL (ref 1.4–7.7)
Neutrophils Relative %: 61.1 % (ref 43.0–77.0)
Platelets: 190 10*3/uL (ref 150.0–400.0)
RBC: 3.83 Mil/uL — ABNORMAL LOW (ref 4.22–5.81)
RDW: 14.2 % (ref 11.5–15.5)
WBC: 5.4 10*3/uL (ref 4.0–10.5)

## 2020-08-08 LAB — HEMOGLOBIN A1C: Hgb A1c MFr Bld: 6.5 % (ref 4.6–6.5)

## 2020-08-11 ENCOUNTER — Other Ambulatory Visit: Payer: Self-pay

## 2020-08-13 NOTE — Patient Instructions (Addendum)
  Blood work was reviewed.  Blood work ordered for your next visit.    Medications changes include :   None   An echocardiogram ( ultrasound of your heart) and carotid ultrasound was ordered.    An EKG was done today.    Please followup in 6 months

## 2020-08-13 NOTE — Progress Notes (Signed)
Subjective:    Patient ID: Walter Hess, male    DOB: Dec 11, 1940, 80 y.o.   MRN: [id number]  HPI The patient is here for follow up of their chronic medical problems, including prediabetes, hyperlipidemia, CKD, pulm fibrosis, GERD.  He is exercising regularly.   He walks on the treadmill.     a1c 6.5% - new, over the holiday he was not compliant with a low sugar diet.     Medications and allergies reviewed with patient and updated if appropriate.  Patient Active Problem List   Diagnosis Date Noted  . Localized osteoarthritis of hand, left 08/14/2020  . Aortic atherosclerosis ([place]) 01/28/2018  . Decreased hearing 01/27/2017  . GERD (gastroesophageal reflux disease) 01/26/2017  . Cough 06/27/2016  . Sinusitis, chronic 02/29/2016  . Postinflammatory pulmonary fibrosis ([place]) 02/29/2016  . Chronic kidney disease 01/30/2016  . Lung nodule 01/28/2016  . Prediabetes 01/25/2016  . Vertebral basilar insufficiency 01/25/2016  . BPH (benign prostatic hyperplasia) 11/27/2011  . DUPUYTREN'S CONTRACTURE, RIGHT 10/05/2009  . HYPERLIPIDEMIA 06/24/2007  . DIVERTICULOSIS, COLON 06/24/2007  . PROSTATE SPECIFIC ANTIGEN( PSA), ELEVATED 06/24/2007  . NONSPECIFIC ABNORMAL ELECTROCARDIOGRAM 06/24/2007    Current Outpatient Medications on File Prior to Visit  Medication Sig Dispense Refill  . aspirin 81 MG tablet Take 81 mg by mouth daily.    . betamethasone dipropionate 0.05 % cream Apply topically 2 (two) times daily as needed.    . Cinnamon 500 MG TABS Take by mouth.    . Coenzyme Q10 (COQ10) 100 MG CAPS Take by mouth daily.    . famotidine (PEPCID) 20 MG tablet One after supper 90 tablet 3  . Multiple Vitamin (MULTIVITAMIN) tablet Take 1 tablet by mouth daily.    . pantoprazole (PROTONIX) 40 MG tablet TAKE 1 TABLET EVERY DAY 30-60 MINUTES BEFORE THE 1ST MEAL OF THE DAY 90 tablet 3  . rosuvastatin (CRESTOR) 5 MG tablet TAKE 1 TABLET BY MOUTH EVERY DAY 90 tablet 1  . S-Adenosylmethionine  (SAM-E) 200 MG TABS Take by mouth daily.    . Saw Palmetto 450 MG CAPS Take by mouth daily.    . Turmeric 500 MG CAPS Take by mouth daily.     No current facility-administered medications on file prior to visit.    Past Medical History:  Diagnosis Date  . Anemia    former blood donor for decades; stopped 2006  . Diverticulosis 2005   Dr. Olevia Perches  . Elevated prostate specific antigen (PSA)    Dr. Amalia Hailey [facility] patient states due to infection  . Gilbert's syndrome   . Hyperlipidemia     Past Surgical History:  Procedure Laterality Date  . CATARACT EXTRACTION, BILATERAL  2017  . COLONOSCOPY  2005   Diverticulosis, Dr.Brodie.  Marland Kitchen GANGLION CYST EXCISION  2005   5th L digit  . LUMBAR LAMINECTOMY  1981    Social History   Socioeconomic History  . Marital status: Married    Spouse name: Not on file  . Number of children: 2  . Years of education: Not on file  . Highest education level: Not on file  Occupational History  . Occupation: retired  Tobacco Use  . Smoking status: Former Smoker    Packs/day: 0.50    Years: 5.00    Pack years: 2.50    Quit date: 06/25/1963    Years since quitting: 57.1  . Smokeless tobacco: Never Used  Vaping Use  . Vaping Use: Never used  Substance and Sexual Activity  .  Alcohol use: Yes    Alcohol/week: 0.0 standard drinks    Comment: beer occasionally  . Drug use: No  . Sexual activity: Not Currently  Other Topics Concern  . Not on file  Social History Narrative  . Not on file   Social Determinants of Health   Financial Resource Strain: Low Risk   . Difficulty of Paying Living Expenses: Not hard at all  Food Insecurity: No Food Insecurity  . Worried About Charity fundraiser in the Last Year: Never true  . Ran Out of Food in the Last Year: Never true  Transportation Needs: No Transportation Needs  . Lack of Transportation (Medical): No  . Lack of Transportation (Non-Medical): No  Physical Activity: Sufficiently Active  . Days of  Exercise per Week: 7 days  . Minutes of Exercise per Session: 30 min  Stress: No Stress Concern Present  . Feeling of Stress : Not at all  Social Connections: Socially Integrated  . Frequency of Communication with Friends and Family: More than three times a week  . Frequency of Social Gatherings with Friends and Family: More than three times a week  . Attends Religious Services: More than 4 times per year  . Active Member of Clubs or Organizations: Yes  . Attends Archivist Meetings: More than 4 times per year  . Marital Status: Married    Family History  Problem Relation Age of Onset  . Heart attack Father 51       former smoker  . Alzheimer's disease Mother   . Coronary artery disease Sister        open heart surgery  . Diabetes Paternal Aunt   . Heart attack Paternal Uncle 52  . Coronary artery disease Paternal Grandfather   . Prostate cancer Paternal Grandfather   . Prostate cancer Maternal Uncle   . Stroke Neg Hx   . Colon cancer Neg Hx     Review of Systems  Constitutional: Negative for chills and fever.  Respiratory: Negative for cough, shortness of breath and wheezing.   Cardiovascular: Negative for chest pain, palpitations and leg swelling.  Musculoskeletal: Negative for neck pain.  Neurological: Positive for light-headedness (only when lays back with head lower than body - ? vertibral insufficiency). Negative for headaches.       Objective:   Vitals:   08/14/20 0925  BP: 126/76  Pulse: 78  Temp: 98.4 F (36.9 C)  SpO2: 97%   BP Readings from Last 3 Encounters:  08/14/20 126/76  08/07/20 140/70  02/24/20 118/60   Wt Readings from Last 3 Encounters:  08/14/20 160 lb (72.6 kg)  08/07/20 161 lb (73 kg)  02/24/20 161 lb (73 kg)   Body mass index is 22.32 kg/m.   Physical Exam    Constitutional: Appears well-developed and well-nourished. No distress.  HENT:  Head: Normocephalic and atraumatic.  Neck: Neck supple. No tracheal deviation  present. No thyromegaly present.  No cervical lymphadenopathy Cardiovascular: Normal rate, regular rhythm and normal heart sounds.   No murmur heard.  Possible right carotid bruit .  No edema Pulmonary/Chest: Effort normal. No respiratory distress. No has no wheezes.  Bilateral dry crackles from pulmonary fibrosis.  Skin: Skin is warm and dry. Not diaphoretic.  Psychiatric: Normal mood and affect. Behavior is normal.      Assessment & Plan:    See Problem List for Assessment and Plan of chronic medical problems.    This visit occurred during the SARS-CoV-2 public health emergency.  Safety protocols were in place, including screening questions prior to the visit, additional usage of staff PPE, and extensive cleaning of exam room while observing appropriate contact time as indicated for disinfecting solutions.

## 2020-08-14 ENCOUNTER — Other Ambulatory Visit: Payer: Self-pay

## 2020-08-14 ENCOUNTER — Encounter: Payer: Self-pay | Admitting: Internal Medicine

## 2020-08-14 ENCOUNTER — Ambulatory Visit (INDEPENDENT_AMBULATORY_CARE_PROVIDER_SITE_OTHER): Payer: Medicare Other | Admitting: Internal Medicine

## 2020-08-14 VITALS — BP 126/76 | HR 78 | Temp 98.4°F | Ht 71.0 in | Wt 160.0 lb

## 2020-08-14 DIAGNOSIS — K219 Gastro-esophageal reflux disease without esophagitis: Secondary | ICD-10-CM

## 2020-08-14 DIAGNOSIS — M19042 Primary osteoarthritis, left hand: Secondary | ICD-10-CM

## 2020-08-14 DIAGNOSIS — Z8249 Family history of ischemic heart disease and other diseases of the circulatory system: Secondary | ICD-10-CM | POA: Diagnosis not present

## 2020-08-14 DIAGNOSIS — G450 Vertebro-basilar artery syndrome: Secondary | ICD-10-CM

## 2020-08-14 DIAGNOSIS — E782 Mixed hyperlipidemia: Secondary | ICD-10-CM | POA: Diagnosis not present

## 2020-08-14 DIAGNOSIS — J8410 Pulmonary fibrosis, unspecified: Secondary | ICD-10-CM

## 2020-08-14 DIAGNOSIS — I517 Cardiomegaly: Secondary | ICD-10-CM | POA: Diagnosis not present

## 2020-08-14 DIAGNOSIS — N1831 Chronic kidney disease, stage 3a: Secondary | ICD-10-CM

## 2020-08-14 DIAGNOSIS — R0989 Other specified symptoms and signs involving the circulatory and respiratory systems: Secondary | ICD-10-CM | POA: Diagnosis not present

## 2020-08-14 DIAGNOSIS — I491 Atrial premature depolarization: Secondary | ICD-10-CM | POA: Insufficient documentation

## 2020-08-14 DIAGNOSIS — R7303 Prediabetes: Secondary | ICD-10-CM

## 2020-08-14 DIAGNOSIS — I700 Atherosclerosis of aorta: Secondary | ICD-10-CM | POA: Diagnosis not present

## 2020-08-14 NOTE — Assessment & Plan Note (Addendum)
Chronic No gerd - started due to dec lung function Discussed pantoprazole and long term side effects. May need to continue for lungs-we will discuss with pulmonary, but continue for now Continue pepcid 20 mg daily

## 2020-08-14 NOTE — Assessment & Plan Note (Addendum)
Chronic a1c 6.5% - higher than in the past - typically well controlled He states he was less compliant with a low sugar diet over the holidays and will make dietary changes Continue regular exercise Weight is very good for him Recheck A1c in 6 months

## 2020-08-14 NOTE — Assessment & Plan Note (Signed)
Chronic Seen on imaging Echocardiogram, EKG

## 2020-08-14 NOTE — Assessment & Plan Note (Signed)
Chronic LDL well controlled  Continue crestor 5 mg daily Continue regular exercise and healthy diet

## 2020-08-14 NOTE — Assessment & Plan Note (Signed)
Chronic Advised avoiding all ibuprofen-take Tylenol for pain if needed Increase fluid intake Blood pressure well controlled Discussed importance of sugar control Discussed importance of healthy diet

## 2020-08-14 NOTE — Assessment & Plan Note (Signed)
Chronic Continue crestor 5 mg daily LDL at goal of less than 70 Exercising regularly

## 2020-08-14 NOTE — Assessment & Plan Note (Signed)
chronic Has seen hand surgeon Discussed symptomatic treatment Discussed avoiding oral nsaids

## 2020-08-14 NOTE — Assessment & Plan Note (Signed)
Chronic Has lightheadedness only when laying back with his head in a certain position-no other time Has been told this is likely related to bone spurs, which is possible Deferred further evaluation

## 2020-08-14 NOTE — Assessment & Plan Note (Addendum)
Chronic Denies palpitations Seen on EKG Recheck EKG today-EKG shows sinus rhythm at 61 bpm with premature atrial complexes, possible left atrial enlargement, LVH and nonspecific T wave abnormality.  Compared to previous EKG from 2017-no significant change We will check echocardiogram

## 2020-08-14 NOTE — Assessment & Plan Note (Signed)
Acute Possible right carotid bruit on exam Check carotid ultrasound

## 2020-08-14 NOTE — Assessment & Plan Note (Addendum)
Sister had acute aortic dissection,?  Aneurysm We will check echocardiogram

## 2020-09-07 ENCOUNTER — Ambulatory Visit (HOSPITAL_BASED_OUTPATIENT_CLINIC_OR_DEPARTMENT_OTHER): Payer: Medicare Other

## 2020-09-07 ENCOUNTER — Other Ambulatory Visit: Payer: Self-pay

## 2020-09-07 ENCOUNTER — Ambulatory Visit (HOSPITAL_COMMUNITY)
Admission: RE | Admit: 2020-09-07 | Discharge: 2020-09-07 | Disposition: A | Discharge status: Home / Self Care | Payer: Medicare Other | Source: Ambulatory Visit | Attending: Internal Medicine | Admitting: Internal Medicine

## 2020-09-07 DIAGNOSIS — Z8249 Family history of ischemic heart disease and other diseases of the circulatory system: Secondary | ICD-10-CM | POA: Insufficient documentation

## 2020-09-07 DIAGNOSIS — I491 Atrial premature depolarization: Secondary | ICD-10-CM | POA: Insufficient documentation

## 2020-09-07 DIAGNOSIS — R0989 Other specified symptoms and signs involving the circulatory and respiratory systems: Secondary | ICD-10-CM

## 2020-09-07 DIAGNOSIS — I517 Cardiomegaly: Secondary | ICD-10-CM | POA: Insufficient documentation

## 2020-09-07 LAB — ECHOCARDIOGRAM COMPLETE
Area-P 1/2: 2.95 cm2
P 1/2 time: 592 msec
S' Lateral: 3.1 cm

## 2020-09-21 DIAGNOSIS — L570 Actinic keratosis: Secondary | ICD-10-CM | POA: Diagnosis not present

## 2020-09-21 DIAGNOSIS — L821 Other seborrheic keratosis: Secondary | ICD-10-CM | POA: Diagnosis not present

## 2020-09-21 DIAGNOSIS — D1801 Hemangioma of skin and subcutaneous tissue: Secondary | ICD-10-CM | POA: Diagnosis not present

## 2021-01-05 ENCOUNTER — Other Ambulatory Visit: Payer: Self-pay | Admitting: Internal Medicine

## 2021-01-23 DIAGNOSIS — J343 Hypertrophy of nasal turbinates: Secondary | ICD-10-CM | POA: Diagnosis not present

## 2021-01-23 DIAGNOSIS — Z974 Presence of external hearing-aid: Secondary | ICD-10-CM | POA: Diagnosis not present

## 2021-01-23 DIAGNOSIS — H6123 Impacted cerumen, bilateral: Secondary | ICD-10-CM | POA: Diagnosis not present

## 2021-01-23 DIAGNOSIS — H903 Sensorineural hearing loss, bilateral: Secondary | ICD-10-CM | POA: Diagnosis not present

## 2021-01-23 DIAGNOSIS — J342 Deviated nasal septum: Secondary | ICD-10-CM | POA: Diagnosis not present

## 2021-01-23 DIAGNOSIS — J320 Chronic maxillary sinusitis: Secondary | ICD-10-CM | POA: Diagnosis not present

## 2021-01-29 ENCOUNTER — Other Ambulatory Visit: Payer: Self-pay | Admitting: *Deleted

## 2021-01-29 MED ORDER — FAMOTIDINE 20 MG PO TABS: ORAL_TABLET | ORAL | 3 refills | Status: DC

## 2021-02-05 ENCOUNTER — Other Ambulatory Visit: Payer: Self-pay

## 2021-02-05 ENCOUNTER — Other Ambulatory Visit (INDEPENDENT_AMBULATORY_CARE_PROVIDER_SITE_OTHER): Payer: Medicare Other

## 2021-02-05 DIAGNOSIS — E782 Mixed hyperlipidemia: Secondary | ICD-10-CM | POA: Diagnosis not present

## 2021-02-05 DIAGNOSIS — N1831 Chronic kidney disease, stage 3a: Secondary | ICD-10-CM | POA: Diagnosis not present

## 2021-02-05 DIAGNOSIS — R7303 Prediabetes: Secondary | ICD-10-CM

## 2021-02-05 LAB — COMPREHENSIVE METABOLIC PANEL
ALT: 11 U/L (ref 0–53)
AST: 20 U/L (ref 0–37)
Albumin: 4.1 g/dL (ref 3.5–5.2)
Alkaline Phosphatase: 38 U/L — ABNORMAL LOW (ref 39–117)
BUN: 18 mg/dL (ref 6–23)
CO2: 28 mEq/L (ref 19–32)
Calcium: 9.5 mg/dL (ref 8.4–10.5)
Chloride: 105 mEq/L (ref 96–112)
Creatinine, Ser: 1.32 mg/dL (ref 0.40–1.50)
GFR: 50.19 mL/min — ABNORMAL LOW (ref >60.00)
Glucose, Bld: 114 mg/dL — ABNORMAL HIGH (ref 70–99)
Potassium: 4.7 mEq/L (ref 3.5–5.1)
Sodium: 141 mEq/L (ref 135–145)
Total Bilirubin: 1 mg/dL (ref 0.2–1.2)
Total Protein: 7.2 g/dL (ref 6.0–8.3)

## 2021-02-05 LAB — CBC WITH DIFFERENTIAL/PLATELET
Basophils Absolute: 0.1 10*3/uL (ref 0.0–0.1)
Basophils Relative: 0.8 % (ref 0.0–3.0)
Eosinophils Absolute: 0.4 10*3/uL (ref 0.0–0.7)
Eosinophils Relative: 5.9 % — ABNORMAL HIGH (ref 0.0–5.0)
HCT: 35.6 % — ABNORMAL LOW (ref 39.0–52.0)
Hemoglobin: 12.4 g/dL — ABNORMAL LOW (ref 13.0–17.0)
Lymphocytes Relative: 16.2 % (ref 12.0–46.0)
Lymphs Abs: 1.1 10*3/uL (ref 0.7–4.0)
MCHC: 34.8 g/dL (ref 30.0–36.0)
MCV: 95.8 fl (ref 78.0–100.0)
Monocytes Absolute: 0.7 10*3/uL (ref 0.1–1.0)
Monocytes Relative: 10.8 % (ref 3.0–12.0)
Neutro Abs: 4.5 10*3/uL (ref 1.4–7.7)
Neutrophils Relative %: 66.3 % (ref 43.0–77.0)
Platelets: 212 10*3/uL (ref 150.0–400.0)
RBC: 3.72 Mil/uL — ABNORMAL LOW (ref 4.22–5.81)
RDW: 14.2 % (ref 11.5–15.5)
WBC: 6.8 10*3/uL (ref 4.0–10.5)

## 2021-02-05 LAB — LIPID PANEL
Cholesterol: 105 mg/dL (ref 0–200)
HDL: 45.4 mg/dL (ref >39.00)
LDL Cholesterol: 46 mg/dL (ref 0–99)
NonHDL: 59.78
Total CHOL/HDL Ratio: 2
Triglycerides: 71 mg/dL (ref 0.0–149.0)
VLDL: 14.2 mg/dL (ref 0.0–40.0)

## 2021-02-05 LAB — HEMOGLOBIN A1C: Hgb A1c MFr Bld: 6.6 % — ABNORMAL HIGH (ref 4.6–6.5)

## 2021-02-07 ENCOUNTER — Ambulatory Visit (INDEPENDENT_AMBULATORY_CARE_PROVIDER_SITE_OTHER): Payer: Medicare Other | Admitting: Internal Medicine

## 2021-02-07 ENCOUNTER — Encounter: Payer: Self-pay | Admitting: Internal Medicine

## 2021-02-07 ENCOUNTER — Other Ambulatory Visit: Payer: Self-pay

## 2021-02-07 DIAGNOSIS — J8410 Pulmonary fibrosis, unspecified: Secondary | ICD-10-CM

## 2021-02-07 NOTE — Progress Notes (Signed)
Subjective:     Patient ID: Walter Hess, male   DOB: 1941/05/29     MRN: [id number]    Brief patient profile:  30  yowm never regular smoker stopped completely  in his 20's and no lower resp problems just sinus symptoms starting in 40s with recurrent infections  but no need for  specialty eval or need for rx then routine cxr suggested lung nodule in 01/2015 repeat in 01/2016 suggested PF so referred to pulmonary clinic 02/29/2016 by Dr  Quay Burow with dx of  PF not typical of UIP (non dx HRCT)     History of Present Illness  02/29/2016 1st [facility] office visit/ Walter Hess   Chief Complaint  Patient presents with   Pulmonary Consult    Referred by Dr. Billey Gosling for abnormal cxr. Pt denies any respiratory co's today.    6 days a week does 5 degrees of elevation at 3-4 mph x 25 min s sob x 40 year pattern s change rec Stay as active as you can and let me know if you are  losing ground with exercise or your oxygen saturation is dropping below your baseline   - above 90% is normal - pulse oximeter is the name of the instrument    05/30/2016  f/u ov/Walter Hess re: pf/ no change ex tol  Chief Complaint  Patient presents with   Follow-up    Breathing is unchanged. No new co's today. PFT's done.   with peak exercise on his home treamill no limiting sob and  never less than 94% sats rec Pantoprazole (protonix) 40 mg   Take  30-60 min before first meal of the day and Pepcid (famotidine)  20 mg one @  bedtime until return to office - this is the best way to tell whether stomach acid is contributing to your problem.  Stop fish oil and eat more fish  GERD diet    06/27/2016 acute extended ov/Walter Hess re: acute cough in pt with underlying PF maint rx ppi/h2hs  Chief Complaint  Patient presents with   Acute Visit    Pt c/o cough and chest congestion- onset 5 days ago.  He has had some dark colored nasal d/c.    cc chronic nasal congestion for which he uses nettipot nightly x years but then much worse   Abruptly x one week prior to OV   then started with cough 2 days p flare of nasal symptoms with nasal secrtions and sputum turning dark grey/green but no fever or sob   rec For cough > mucinex dm up to 1200 mg every 12 hours as needed  Augmentin 875 mg take one pill twice daily  X 10 days - take at breakfast and supper with large glass of water.  It would help reduce the usual side effects (diarrhea and yeast infections) if you ate cultured yogurt at lunch.  Please remember to go to the  x-ray department downstairs for your tests - we will call you with the results when they are available.   11/25/2016  f/u ov/Walter Hess re:  PF / cough resolved on gerd rx  Chief Complaint  Patient presents with   Follow-up    Cough has resolved. No new co's.    6 days per week, treadmill at 5 degrees x 4.5 mph x 25 min with sats 92% and no change ex tol or sats  No cough at all  rec No change in acid suppression  Please see patient coordinator before you  leave today  to schedule HRCT in 2 months        11/25/2017  f/u ov/Walter Hess re: PF  Non dx HRCT only rx is for gerd Chief Complaint  Patient presents with   Follow-up    Breathing is unchanged since last OV. Pt does not have any complaints today. PFT was performed.  Dyspnea:  No change in sats with ex  Cough: sporadic, not with ex Sleep: fine rec Continue your exercise program and let me know if you note any decline in 02 sats with exercise over time     02/07/2021  f/u ov/Walter Hess re:  PF  UIP vs NSIP favor former  Chief Complaint  Patient presents with   Follow-up    Patient denies any concerns.   Dyspnea:  20 m s stopping with sats no lower than 90% Cough: none  Sleeping: fine flat SABA use: none  02: none  Covid status:   vax x 3  ENT [employer] [facility] but has not seen re hoarseness    No obvious day to day or daytime variability or assoc excess/ purulent sputum or mucus plugs or hemoptysis or cp or chest tightness, subjective wheeze or overt sinus or hb  symptoms.   sleeping without nocturnal  or early am exacerbation  of respiratory  c/o's or need for noct saba. Also denies any obvious fluctuation of symptoms with weather or environmental changes or other aggravating or alleviating factors except as outlined above   No unusual exposure hx or h/o childhood pna/ asthma or knowledge of premature birth.  Current Allergies, Complete Past Medical History, Past Surgical History, Family History, and Social History were reviewed in [employer] record.  ROS  The following are not active complaints unless bolded Hoarseness, sore throat, dysphagia, dental problems, itching, sneezing,  nasal congestion or discharge of excess mucus or purulent secretions, ear ache,   fever, chills, sweats, unintended wt loss or wt gain, classically pleuritic or exertional cp,  orthopnea pnd or arm/hand swelling  or leg swelling, presyncope, palpitations, abdominal pain, anorexia, nausea, vomiting, diarrhea  or change in bowel habits or change in bladder habits, change in stools or change in urine, dysuria, hematuria,  rash, arthralgias, visual complaints, headache, numbness, weakness or ataxia or problems with walking or coordination,  change in mood or  memory.        Current Meds  Medication Sig   aspirin 81 MG tablet Take 81 mg by mouth daily.   betamethasone dipropionate 0.05 % cream Apply topically 2 (two) times daily as needed.   Cinnamon 500 MG TABS Take by mouth.   Coenzyme Q10 (COQ10) 100 MG CAPS Take by mouth daily.   famotidine (PEPCID) 20 MG tablet One after supper   Multiple Vitamin (MULTIVITAMIN) tablet Take 1 tablet by mouth daily.   pantoprazole (PROTONIX) 40 MG tablet TAKE 1 TABLET EVERY DAY 30-60 MINUTES BEFORE THE 1ST MEAL OF THE DAY   rosuvastatin (CRESTOR) 5 MG tablet TAKE 1 TABLET BY MOUTH EVERY DAY   S-Adenosylmethionine (SAM-E) 200 MG TABS Take by mouth daily.   Saw Palmetto 450 MG CAPS Take by mouth daily.   Turmeric 500 MG  CAPS Take by mouth daily.                            Objective:   Physical Exam  02/07/2021       160  08/07/2020        161  01/27/2020           162  12/11/2018        160 11/25/2017          165  05/27/2017        166  11/25/2016          167  06/27/2016          163 05/30/2016        168   02/29/16 169 lb 12.8 oz (77 kg)  01/25/16 160 lb 8 oz (72.8 kg)  01/23/15 170 lb (77.1 kg)    Vital signs reviewed  02/07/2021  - Note at rest 02 sats  96% on RA   General appearance:    robust pleasant amb wm nad      HEENT : pt wearing mask not removed for exam due to covid -19 concerns.    NECK :  without JVD/Nodes/TM/ nl carotid upstrokes bilaterally   LUNGS: no acc muscle use,  Nl contour chest with race insp crackles in bases  bilaterally without cough on insp or exp maneuvers   CV:  RRR  no s3 or murmur or increase in P2, and no edema   ABD:  soft and nontender with nl inspiratory excursion in the supine position. No bruits or organomegaly appreciated, bowel sounds nl  MS:  Nl gait/ ext warm without deformities, calf tenderness, cyanosis or clubbing No obvious joint restrictions   R >> L duptryen's  deformities,  SKIN: warm and dry without lesions    NEURO:  alert, approp, nl sensorium with  no motor or cerebellar deficits apparent.                  Assessment:

## 2021-02-07 NOTE — Assessment & Plan Note (Signed)
Incidental finding/ pt asymptomatic Ct scan 01/2016:  No suspicious pulmonary nodules. Changes of pulmonary fibrosis, most pronounced in the mid and lower lung zones. - PFT's  05/30/2016  FEV1 3.07 (101 % ) ratio 81  p 2 % improvement from saba p nothing  prior to study with DLCO  54/59 % corrects to 76 % for alv volume   - HRCT  01/24/2017 1. There is a spectrum of findings compatible with interstitial lung disease, an although the overall appearance is considered a "probable UIP CT pattern", the lack of progression compared to the prior study, lack of frank honeycombing and presence of air trapping raises the possibility of fibrotic phase nonspecific interstitial pneumonia (NSIP).  - PFT's  11/25/2017   FVC 3.80 (91%) with DLCO  57 % corrects to 78 % for alv volume  - PFT's  12/11/2018 FVC 3.75 (91%)   with DLCO  60 % corrects to 73  % for alv volume   -  01/27/2020   Walked RA  2 laps @ approx 274f each @ fast pace  stopped due to end of study, no sob with sats 89% at end  - HRCT 02/14/2020   Minimal change UIP pattern  -  08/07/2020   Walked RA  2 laps @ approx 2512feach @ fast pace  stopped due to end of study,  no sob with sats  93% at end  - 02/07/2021   Walked on RA x  2  lap(s) =  approx 500 ft @ nl pace, stopped due to end of study  with lowest 02 sats 91% at end    He feels better with ex x 6 m so as long as he continues to monitor sats at same level of activity and no drop < 90% he will be difficult to convince to consider antifibrotic rx but I asked him to keep an open mind and f/u q 6 m sooner prn with pft/hrct and f/u with PF clinic  Discussed in detail all the  indications, usual  risks and alternatives  relative to the benefits with patient who agrees to proceed with conservative f/u as outlined    Each maintenance medication was reviewed in detail including emphasizing most importantly the difference between maintenance and prns and under what circumstances the prns are to be triggered  using an action plan format where appropriate.  Total time for H and P, chart review, counseling,  directly observing portions of ambulatory 02 saturation study/ and generating customized AVS unique to this office visit / same day charting = 30 min

## 2021-02-07 NOTE — Patient Instructions (Signed)
Make sure you check your oxygen saturation at your highest level of activity to be sure it stays over 90% and keep track of it at least once a week, more often if breathing getting worse, and let me know if losing ground.    Please schedule a follow up visit in6 months but call sooner if needed

## 2021-02-12 ENCOUNTER — Ambulatory Visit: Payer: Medicare Other | Admitting: Internal Medicine

## 2021-02-12 NOTE — Progress Notes (Signed)
Subjective:    Patient ID: Walter Hess, male    DOB: [birth year], 80 y.o.   MRN: [id number]  HPI The patient is here for follow up of their chronic medical problems, including diabetes, hichol, CKD, pulm fibrosis, GERD  Had blood work done - a1c in diabetic range x 2.    He is exercising regularly - 6 days a week.  He is compliant with a diabetic diet.   Some difficulty recalling names.  He knows this is probably normal for his age.  He denies any other difficulties consistent with memory issues.  Medications and allergies reviewed with patient and updated if appropriate.  Patient Active Problem List   Diagnosis Date Noted   Anemia 02/13/2021   Localized osteoarthritis of hand, left 08/14/2020   PAC (premature atrial contraction) 08/14/2020   Cardiomegaly 08/14/2020   Family history of thoracic aortic aneurysm 08/14/2020   Aortic atherosclerosis ([place]) 01/28/2018   Decreased hearing 01/27/2017   GERD (gastroesophageal reflux disease) 01/26/2017   Cough 06/27/2016   Sinusitis, chronic 02/29/2016   Postinflammatory pulmonary fibrosis ([place]) 02/29/2016   Chronic kidney disease 01/30/2016   Lung nodule 01/28/2016   Diabetes ([place]) 01/25/2016   Vertebral basilar insufficiency 01/25/2016   BPH (benign prostatic hyperplasia) 11/27/2011   DUPUYTREN'S CONTRACTURE, RIGHT 10/05/2009   HYPERLIPIDEMIA 06/24/2007   DIVERTICULOSIS, COLON 06/24/2007   PROSTATE SPECIFIC ANTIGEN( PSA), ELEVATED 06/24/2007   NONSPECIFIC ABNORMAL ELECTROCARDIOGRAM 06/24/2007    Current Outpatient Medications on File Prior to Visit  Medication Sig Dispense Refill   aspirin 81 MG tablet Take 81 mg by mouth daily.     betamethasone dipropionate 0.05 % cream Apply topically 2 (two) times daily as needed.     Cinnamon 500 MG TABS Take by mouth.     Coenzyme Q10 (COQ10) 100 MG CAPS Take by mouth daily.     Multiple Vitamin (MULTIVITAMIN) tablet Take 1 tablet by mouth daily.     pantoprazole (PROTONIX) 40  MG tablet TAKE 1 TABLET EVERY DAY 30-60 MINUTES BEFORE THE 1ST MEAL OF THE DAY 90 tablet 3   rosuvastatin (CRESTOR) 5 MG tablet TAKE 1 TABLET BY MOUTH EVERY DAY 90 tablet 1   S-Adenosylmethionine (SAM-E) 200 MG TABS Take by mouth daily.     Saw Palmetto 450 MG CAPS Take by mouth daily.     Turmeric 500 MG CAPS Take by mouth daily.     No current facility-administered medications on file prior to visit.    Past Medical History:  Diagnosis Date   Anemia    former blood donor for decades; stopped 2006   Diverticulosis 2005   Dr. Olevia Perches   Elevated prostate specific antigen (PSA)    Dr. Amalia Hailey [facility] patient states due to infection   Gilbert's syndrome    Hyperlipidemia     Past Surgical History:  Procedure Laterality Date   CATARACT EXTRACTION, BILATERAL  2017   COLONOSCOPY  2005   Diverticulosis, Dr.Brodie.   GANGLION CYST EXCISION  2005   5th L digit   LUMBAR LAMINECTOMY  1981    Social History   Socioeconomic History   Marital status: Married    Spouse name: Not on file   Number of children: 2   Years of education: Not on file   Highest education level: Not on file  Occupational History   Occupation: retired  Tobacco Use   Smoking status: Former    Packs/day: 0.50    Years: 5.00    Pack years: 2.50  Types: Cigarettes    Quit date: 06/25/1963    Years since quitting: 57.6   Smokeless tobacco: Never  Vaping Use   Vaping Use: Never used  Substance and Sexual Activity   Alcohol use: Yes    Alcohol/week: 0.0 standard drinks    Comment: beer occasionally   Drug use: No   Sexual activity: Not Currently  Other Topics Concern   Not on file  Social History Narrative   Not on file   Social Determinants of Health   Financial Resource Strain: Low Risk    Difficulty of Paying Living Expenses: Not hard at all  Food Insecurity: No Food Insecurity   Worried About Charity fundraiser in the Last Year: Never true   [place] in the Last Year: Never true   Transportation Needs: No Transportation Needs   Lack of Transportation (Medical): No   Lack of Transportation (Non-Medical): No  Physical Activity: Sufficiently Active   Days of Exercise per Week: 7 days   Minutes of Exercise per Session: 30 min  Stress: No Stress Concern Present   Feeling of Stress : Not at all  Social Connections: Socially Integrated   Frequency of Communication with Friends and Family: More than three times a week   Frequency of Social Gatherings with Friends and Family: More than three times a week   Attends Religious Services: More than 4 times per year   Active Member of [employer] or Organizations: Yes   Attends Music therapist: More than 4 times per year   Marital Status: Married    Family History  Problem Relation Age of Onset   Heart attack Father 39       former smoker   Alzheimer's disease Mother    Coronary artery disease Sister        open heart surgery   Diabetes Paternal Aunt    Heart attack Paternal Uncle 68   Coronary artery disease Paternal Grandfather    Prostate cancer Paternal Grandfather    Prostate cancer Maternal Uncle    Stroke Neg Hx    Colon cancer Neg Hx     Review of Systems  Constitutional:  Negative for fever.  HENT:  Negative for trouble swallowing.   Respiratory:  Negative for cough, shortness of breath and wheezing.   Cardiovascular:  Negative for chest pain, palpitations and leg swelling.  Gastrointestinal:  Negative for abdominal pain and nausea.  Neurological:  Negative for light-headedness and headaches.      Objective:   Vitals:   02/13/21 1033  BP: 122/78  Pulse: 86  Temp: 98.4 F (36.9 C)  SpO2: 95%   BP Readings from Last 3 Encounters:  02/13/21 122/78  02/07/21 120/78  08/14/20 126/76   Wt Readings from Last 3 Encounters:  02/13/21 161 lb (73 kg)  02/07/21 160 lb 3.2 oz (72.7 kg)  08/14/20 160 lb (72.6 kg)   Body mass index is 22.45 kg/m.   Physical Exam    Constitutional:  Appears well-developed and well-nourished. No distress.  HENT:  Head: Normocephalic and atraumatic.  Neck: Neck supple. No tracheal deviation present. No thyromegaly present.  No cervical lymphadenopathy Cardiovascular: Normal rate, regular rhythm and normal heart sounds.   No murmur heard. No carotid bruit .  No edema Pulmonary/Chest: Effort normal and breath sounds normal. No respiratory distress. No has no wheezes. Bibasilar crackles Skin: Skin is warm and dry. Not diaphoretic.  Psychiatric: Normal mood and affect. Behavior is normal.  Assessment & Plan:    See Problem List for Assessment and Plan of chronic medical problems.    This visit occurred during the SARS-CoV-2 public health emergency.  Safety protocols were in place, including screening questions prior to the visit, additional usage of staff PPE, and extensive cleaning of exam room while observing appropriate contact time as indicated for disinfecting solutions.

## 2021-02-12 NOTE — Patient Instructions (Addendum)
    Medications changes include :   increase famotidine to 40 mg daily.  Take pantoprazole every day other day and slowly wean off the medication.     Your prescription(s) have been submitted to your pharmacy. Please take as directed and contact our office if you believe you are having problem(s) with the medication(s).     Please followup in 6 months

## 2021-02-13 ENCOUNTER — Ambulatory Visit (INDEPENDENT_AMBULATORY_CARE_PROVIDER_SITE_OTHER): Payer: Medicare Other | Admitting: Internal Medicine

## 2021-02-13 ENCOUNTER — Other Ambulatory Visit: Payer: Self-pay

## 2021-02-13 ENCOUNTER — Encounter: Payer: Self-pay | Admitting: Internal Medicine

## 2021-02-13 VITALS — BP 122/78 | HR 86 | Temp 98.4°F | Ht 71.0 in | Wt 161.0 lb

## 2021-02-13 DIAGNOSIS — D649 Anemia, unspecified: Secondary | ICD-10-CM | POA: Diagnosis not present

## 2021-02-13 DIAGNOSIS — E782 Mixed hyperlipidemia: Secondary | ICD-10-CM

## 2021-02-13 DIAGNOSIS — N1831 Chronic kidney disease, stage 3a: Secondary | ICD-10-CM | POA: Diagnosis not present

## 2021-02-13 DIAGNOSIS — K219 Gastro-esophageal reflux disease without esophagitis: Secondary | ICD-10-CM | POA: Diagnosis not present

## 2021-02-13 DIAGNOSIS — E119 Type 2 diabetes mellitus without complications: Secondary | ICD-10-CM

## 2021-02-13 MED ORDER — FAMOTIDINE 40 MG PO TABS: 40.0000 mg | ORAL_TABLET | Freq: Every day | ORAL | 1 refills | Status: DC

## 2021-02-13 NOTE — Assessment & Plan Note (Addendum)
Chronic No GERD symptoms - started for dec lung function Continue pepcid, but increase to 40 mg daily Will try tapering slowly off of pantoprazole 40 mg daily because of chronic kidney disease-start taking every other day and slowly taper

## 2021-02-13 NOTE — Assessment & Plan Note (Signed)
New Lab Results  Component Value Date   HGBA1C 6.6 (H) 02/05/2021   Diet controlled Continue regular exercise, diabetic diet

## 2021-02-13 NOTE — Assessment & Plan Note (Signed)
Chronic Lipids well controlled Continue Crestor 5 mg daily 

## 2021-02-13 NOTE — Assessment & Plan Note (Signed)
Chronic Mild, but slightly worse now He takes occasional NSAIDs-1 Advil at a time only Advised increasing fluids Blood pressure well controlled, sugars well controlled We will try tapering him off of the pantoprazole slowly and keep him on just the famotidine Imaging of kidneys on CT scan with the last couple of years was normal No difficulty emptying bladder Recheck CMP in 6 months, urine microalbumin, UA

## 2021-02-13 NOTE — Assessment & Plan Note (Addendum)
Chronic Mild ?  Related to CKD or anemia of chronic disease No symptoms consistent with GI bleed Check CBC, iron panel in 6 months

## 2021-02-23 ENCOUNTER — Ambulatory Visit: Payer: Medicare Other

## 2021-02-27 ENCOUNTER — Ambulatory Visit (INDEPENDENT_AMBULATORY_CARE_PROVIDER_SITE_OTHER): Payer: Medicare Other

## 2021-02-27 ENCOUNTER — Other Ambulatory Visit: Payer: Self-pay

## 2021-02-27 VITALS — BP 138/60 | HR 63 | Temp 98.5°F | Resp 16 | Ht 71.0 in | Wt 160.4 lb

## 2021-02-27 DIAGNOSIS — Z0000 Encounter for general adult medical examination without abnormal findings: Secondary | ICD-10-CM | POA: Diagnosis not present

## 2021-02-27 NOTE — Progress Notes (Signed)
Subjective:   Walter Hess is a 80 y.o. male who presents for Medicare Annual/Subsequent preventive examination.  Review of Systems     Cardiac Risk Factors include: advanced age (>92mn, >>41women);diabetes mellitus;dyslipidemia;hypertension;male gender;family history of premature cardiovascular disease     Objective:    Today's Vitals   02/27/21 1055  BP: 138/60  Pulse: 63  Resp: 16  Temp: 98.5 F (36.9 C)  SpO2: 96%  Weight: 160 lb 6.4 oz (72.8 kg)  Height: '5\' 11"'$  (1.803 m)  PainSc: 0-No pain   Body mass index is 22.37 kg/m.  Advanced Directives 02/27/2021 02/24/2020 02/23/2019 04/14/2014  Does Patient Have a Medical Advance Directive? Yes Yes Yes Yes  Type of Advance Directive - Living will;Healthcare Power of [place] Living will;Healthcare Power of Attorney  Does patient want to make changes to medical advance directive? No - Patient declined No - Patient declined - -  Copy of [place]? - No - copy requested No - copy requested -    Current Medications (verified) Outpatient Encounter Medications as of 02/27/2021  Medication Sig   aspirin 81 MG tablet Take 81 mg by mouth daily.   betamethasone dipropionate 0.05 % cream Apply topically 2 (two) times daily as needed.   Cinnamon 500 MG TABS Take by mouth.   Coenzyme Q10 (COQ10) 100 MG CAPS Take by mouth daily.   famotidine (PEPCID) 40 MG tablet Take 1 tablet (40 mg total) by mouth daily.   Multiple Vitamin (MULTIVITAMIN) tablet Take 1 tablet by mouth daily.   rosuvastatin (CRESTOR) 5 MG tablet TAKE 1 TABLET BY MOUTH EVERY DAY   S-Adenosylmethionine (SAM-E) 200 MG TABS Take by mouth daily.   Saw Palmetto 450 MG CAPS Take by mouth daily.   Turmeric 500 MG CAPS Take by mouth daily.   pantoprazole (PROTONIX) 40 MG tablet TAKE 1 TABLET EVERY DAY 30-60 MINUTES BEFORE THE 1ST MEAL OF THE DAY (Patient not taking: Reported on 02/27/2021)   No  facility-administered encounter medications on file as of 02/27/2021.    Allergies (verified) Patient has no allergy information on record.   History: Past Medical History:  Diagnosis Date   Anemia    former blood donor for decades; stopped 2006   Diverticulosis 2005   Dr. BOlevia Perches  Elevated prostate specific antigen (PSA)    [facility]   Gilbert's syndrome    Hyperlipidemia    Past Surgical History:  Procedure Laterality Date   CATARACT EXTRACTION, BILATERAL  2017   COLONOSCOPY  2005   Diverticulosis, Dr.Brodie.   GANGLION CYST EXCISION  2005   5th L digit   LUMBAR LAMINECTOMY  1981   Family History  Problem Relation Age of Onset   Heart attack Father 555      former smoker   Alzheimer's disease Mother    Coronary artery disease Sister        open heart surgery   Diabetes Paternal Aunt    Heart attack Paternal Uncle 536  Coronary artery disease Paternal Grandfather    Prostate cancer Paternal Grandfather    Prostate cancer Maternal Uncle    Stroke Neg Hx    Colon cancer Neg Hx    Social History   Socioeconomic History   Marital status: Married    Spouse name: Not on file   Number of children: 2   Years of education: Not on file  Highest education level: Not on file  Occupational History   Occupation: retired  Tobacco Use   Smoking status: Former    Packs/day: 0.50    Years: 5.00    Pack years: 2.50    Types: Cigarettes    Quit date: 06/25/1963    Years since quitting: 57.7   Smokeless tobacco: Never  Vaping Use   Vaping Use: Never used  Substance and Sexual Activity   Alcohol use: Yes    Alcohol/week: 0.0 standard drinks    Comment: beer occasionally   Drug use: No   Sexual activity: Not Currently  Other Topics Concern   Not on file  Social History Narrative   Not on file   Social Determinants of Health   Financial Resource Strain: Low Risk    Difficulty of Paying Living Expenses: Not hard at all  Food  Insecurity: No Food Insecurity   Worried About Charity fundraiser in the Last Year: Never true   [place] in the Last Year: Never true  Transportation Needs: No Transportation Needs   Lack of Transportation (Medical): No   Lack of Transportation (Non-Medical): No  Physical Activity: Sufficiently Active   Days of Exercise per Week: 6 days   Minutes of Exercise per Session: 30 min  Stress: No Stress Concern Present   Feeling of Stress : Not at all  Social Connections: Socially Integrated   Frequency of Communication with Friends and Family: More than three times a week   Frequency of Social Gatherings with Friends and Family: More than three times a week   Attends Religious Services: More than 4 times per year   Active Member of [employer] or Organizations: Yes   Attends Music therapist: More than 4 times per year   Marital Status: Married    Tobacco Counseling Counseling given: Not Answered   Clinical Intake:  Pre-visit preparation completed: Yes  Pain : No/denies pain Pain Score: 0-No pain     BMI - recorded: 22.37 Nutritional Status: BMI of 19-24  Normal Nutritional Risks: None Diabetes: Yes CBG done?: No Did pt. bring in CBG monitor from home?: No  How often do you need to have someone help you when you read instructions, pamphlets, or other written materials from your doctor or pharmacy?: 1 - Never What is the last grade level you completed in school?: Bachelor's Degree from [employer]  Diabetic? yes  Interpreter Needed?: No  Information entered by :: Lisette Abu, LPN   Activities of Daily Living In your present state of health, do you have any difficulty performing the following activities: 02/27/2021  Hearing? N  Comment wears hearing aids  Vision? N  Difficulty concentrating or making decisions? N  Walking or climbing stairs? N  Dressing or bathing? N  Doing errands, shopping? N  Preparing Food and eating ? N  Using the Toilet? N  In  the past six months, have you accidently leaked urine? N  Do you have problems with loss of bowel control? N  Managing your Medications? N  Managing your Finances? N  Housekeeping or managing your Housekeeping? N  Some recent data might be hidden    Patient Care Team: Binnie Rail, MD as PCP - General (Internal Medicine) Tanda Rockers, MD as Consulting Physician (Pulmonary Disease) Domingo Pulse, MD (Urology) Marica Otter, OD as Consulting Physician (Optometry)  Indicate any recent Medical Services you may have received from other than [facility] providers in the past year (date  may be approximate).     Assessment:   This is a routine wellness examination for Kj.  Hearing/Vision screen Hearing Screening - Comments:: Patient denied any hearing difficulty.  Patient wears hearing aids. Vision Screening - Comments:: Patient wears eyeglasses.  Eye exam done every 6 months by Marica Otter, OD.  Dietary issues and exercise activities discussed: Current Exercise Habits: Home exercise routine, Type of exercise: walking;treadmill;Other - see comments;stretching;strength training/weights (30 sit ups, stretching, hand weights, walk on treadmill at 4 degree elevation on 3rd speed), Time (Minutes): 30, Frequency (Times/Week): 6, Weekly Exercise (Minutes/Week): 180, Intensity: Moderate, Exercise limited by: respiratory conditions(s);cardiac condition(s)   Goals Addressed   None   Depression Screen PHQ 2/9 Scores 02/27/2021 02/24/2020 02/23/2019 02/01/2019 08/03/2018 01/28/2018 01/13/2013  PHQ - 2 Score 0 0 0 0 0 0 0    Fall Risk Fall Risk  02/27/2021 02/24/2020 08/04/2019 02/23/2019 02/01/2019  Falls in the past year? 0 0 0 0 0  Comment - - - - -  Number falls in past yr: 0 0 0 0 0  Injury with Fall? 0 0 - 0 -  Risk for fall due to : No Fall Risks No Fall Risks - - -  Follow up Falls evaluation completed Falls evaluation completed - - -    FALL RISK PREVENTION PERTAINING TO THE HOME:  Any stairs in  or around the home? Yes  If so, are there any without handrails? No  Home free of loose throw rugs in walkways, pet beds, electrical cords, etc? Yes  Adequate lighting in your home to reduce risk of falls? Yes   ASSISTIVE DEVICES UTILIZED TO PREVENT FALLS:  Life alert? No  Use of a cane, walker or w/c? No  Grab bars in the bathroom? No  Shower chair or bench in shower? No  Elevated toilet seat or a handicapped toilet? No   TIMED UP AND GO:  Was the test performed? Yes .  Length of time to ambulate 10 feet: 5 sec.   Gait steady and fast without use of assistive device  Cognitive Function: Normal cognitive status assessed by direct observation by this Nurse Health Advisor. No abnormalities found.       6CIT Screen 02/24/2020  What Year? 0 points  What month? 0 points  What time? 0 points  Count back from 20 0 points  Months in reverse 0 points  Repeat phrase 0 points  Total Score 0    Immunizations Immunization History  Administered Date(s) Administered   Fluad Quad(high Dose 65+) 02/26/2019, 03/25/2019   Influenza Split 04/03/2011, 04/08/2012   Influenza Whole 04/07/2007, 03/31/2008, 03/22/2009, 03/27/2010   Influenza, High Dose Seasonal PF 03/25/2013, 03/23/2014, 03/14/2017, 03/20/2018, 03/14/2020   Influenza-Unspecified 03/14/2015, 02/08/2016, 03/14/2017   PFIZER(Purple Top)SARS-COV-2 Vaccination 07/07/2019, 07/27/2019, 03/22/2020   Pneumococcal Conjugate-13 01/23/2015   Pneumococcal Polysaccharide-23 10/05/2009   Tdap 04/26/2004   Tetanus 07/01/2014   Zoster, Live 06/25/2011    TDAP status: Up to date  Flu Vaccine status: Up to date  Pneumococcal vaccine status: Up to date  Covid-19 vaccine status: Completed vaccines  Qualifies for Shingles Vaccine? Yes   Zostavax completed Yes   Shingrix Completed?: No.    Education has been provided regarding the importance of this vaccine. Patient has been advised to call insurance company to determine out of pocket  expense if they have not yet received this vaccine. Advised may also receive vaccine at local pharmacy or Health Dept. Verbalized acceptance and understanding.  Screening Tests Health Maintenance  Topic Date Due   FOOT EXAM  Never done   OPHTHALMOLOGY EXAM  Never done   URINE MICROALBUMIN  Never done   Zoster Vaccines- Shingrix (1 of 2) Never done   COVID-19 Vaccine (4 - Booster for Pfizer series) 07/22/2020   INFLUENZA VACCINE  01/22/2021   HEMOGLOBIN A1C  08/08/2021   TETANUS/TDAP  07/01/2024   PNA vac Low Risk Adult  Completed   HPV VACCINES  Aged Out    Health Maintenance  Health Maintenance Due  Topic Date Due   FOOT EXAM  Never done   OPHTHALMOLOGY EXAM  Never done   URINE MICROALBUMIN  Never done   Zoster Vaccines- Shingrix (1 of 2) Never done   COVID-19 Vaccine (4 - Booster for Pfizer series) 07/22/2020   INFLUENZA VACCINE  01/22/2021    Colorectal cancer screening: No longer required.   Lung Cancer Screening: (Low Dose CT Chest recommended if Age 98-80 years, 30 pack-year currently smoking OR have quit w/in 15years.) does not qualify.   Lung Cancer Screening Referral: no  Additional Screening:  Hepatitis C Screening: does qualify; Completed no  Vision Screening: Recommended annual ophthalmology exams for early detection of glaucoma and other disorders of the eye. Is the patient up to date with their annual eye exam?  Yes  Who is the provider or what is the name of the office in which the patient attends annual eye exams? Marica Otter, OD. If pt is not established with a provider, would they like to be referred to a provider to establish care? No .   Dental Screening: Recommended annual dental exams for proper oral hygiene  Community Resource Referral / Chronic Care Management: CRR required this visit?  No   CCM required this visit?  No      Plan:     I have personally reviewed and noted the following in the patient's chart:   Medical and social  history Use of alcohol, tobacco or illicit drugs  Current medications and supplements including opioid prescriptions. Patient is not currently taking opioid prescriptions. Functional ability and status Nutritional status Physical activity Advanced directives List of other physicians Hospitalizations, surgeries, and ER visits in previous 12 months Vitals Screenings to include cognitive, depression, and falls Referrals and appointments  In addition, I have reviewed and discussed with patient certain preventive protocols, quality metrics, and best practice recommendations. A written personalized care plan for preventive services as well as general preventive health recommendations were provided to patient.     Sheral Flow, LPN   [id number]   Nurse Notes:  Hearing Screening - Comments:: Patient denied any hearing difficulty.  Patient wears hearing aids. Vision Screening - Comments:: Patient wears eyeglasses.  Eye exam done every 6 months by Marica Otter, OD.

## 2021-02-27 NOTE — Patient Instructions (Signed)
[name] , Thank you for taking time to come for your Medicare Wellness Visit. I appreciate your ongoing commitment to your health goals. Please review the following plan we discussed and let me know if I can assist you in the future.   Screening recommendations/referrals: Colonoscopy: Not a candidate for screening due to age Recommended yearly ophthalmology/optometry visit for glaucoma screening and checkup Recommended yearly dental visit for hygiene and checkup  Vaccinations: Influenza vaccine: 03/14/2020 Pneumococcal vaccine: 10/05/2009, 01/23/2015 Tdap vaccine: 07/01/2014; due every 10 years (overdue) Shingles vaccine: never done   Covid-19: 07/07/2019, 07/27/2019, 03/22/2020  Advanced directives: Yes; documents were scanned into chart.  Conditions/risks identified: Yes; Client understands the importance of follow-up with providers by attending scheduled visits and discussed goals to eat healthier, increase physical activity, exercise the brain, socialize more, get enough sleep and make time for laughter.  Next appointment: Please schedule your next Medicare Wellness Visit with your Nurse Health Advisor in 1 year by calling [phone].  Preventive Care 2 Years and Older, Male Preventive care refers to lifestyle choices and visits with your health care provider that can promote health and wellness. What does preventive care include? A yearly physical exam. This is also called an annual well check. Dental exams once or twice a year. Routine eye exams. Ask your health care provider how often you should have your eyes checked. Personal lifestyle choices, including: Daily care of your teeth and gums. Regular physical activity. Eating a healthy diet. Avoiding tobacco and drug use. Limiting alcohol use. Practicing safe sex. Taking low doses of aspirin every day. Taking vitamin and mineral supplements as recommended by your health care provider. What happens during an annual well  check? The services and screenings done by your health care provider during your annual well check will depend on your age, overall health, lifestyle risk factors, and family history of disease. Counseling  Your health care provider may ask you questions about your: Alcohol use. Tobacco use. Drug use. Emotional well-being. Home and relationship well-being. Sexual activity. Eating habits. History of falls. Memory and ability to understand (cognition). Work and work Statistician. Screening  You may have the following tests or measurements: Height, weight, and BMI. Blood pressure. Lipid and cholesterol levels. These may be checked every 5 years, or more frequently if you are over 54 years old. Skin check. Lung cancer screening. You may have this screening every year starting at age 80 if you have a 30-pack-year history of smoking and currently smoke or have quit within the past 15 years. Fecal occult blood test (FOBT) of the stool. You may have this test every year starting at age 4. Flexible sigmoidoscopy or colonoscopy. You may have a sigmoidoscopy every 5 years or a colonoscopy every 10 years starting at age 26. Prostate cancer screening. Recommendations will vary depending on your family history and other risks. Hepatitis C blood test. Hepatitis B blood test. Sexually transmitted disease (STD) testing. Diabetes screening. This is done by checking your blood sugar (glucose) after you have not eaten for a while (fasting). You may have this done every 1-3 years. Abdominal aortic aneurysm (AAA) screening. You may need this if you are a current or former smoker. Osteoporosis. You may be screened starting at age 73 if you are at high risk. Talk with your health care provider about your test results, treatment options, and if necessary, the need for more tests. Vaccines  Your health care provider may recommend certain vaccines, such as: Influenza vaccine. This is recommended every  year. Tetanus, diphtheria, and acellular pertussis (Tdap, Td) vaccine. You may need a Td booster every 10 years. Zoster vaccine. You may need this after age 85. Pneumococcal 13-valent conjugate (PCV13) vaccine. One dose is recommended after age 71. Pneumococcal polysaccharide (PPSV23) vaccine. One dose is recommended after age 38. Talk to your health care provider about which screenings and vaccines you need and how often you need them. This information is not intended to replace advice given to you by your health care provider. Make sure you discuss any questions you have with your health care provider. Document Released: 07/07/2015 Document Revised: 02/28/2016 Document Reviewed: 04/11/2015 Elsevier Interactive Patient Education  2017 [employer] Prevention in the Home Falls can cause injuries. They can happen to people of all ages. There are many things you can do to make your home safe and to help prevent falls. What can I do on the outside of my home? Regularly fix the edges of walkways and driveways and fix any cracks. Remove anything that might make you trip as you walk through a door, such as a raised step or threshold. Trim any bushes or trees on the path to your home. Use bright outdoor lighting. Clear any walking paths of anything that might make someone trip, such as rocks or tools. Regularly check to see if handrails are loose or broken. Make sure that both sides of any steps have handrails. Any raised decks and porches should have guardrails on the edges. Have any leaves, snow, or ice cleared regularly. Use sand or salt on walking paths during winter. Clean up any spills in your garage right away. This includes oil or grease spills. What can I do in the bathroom? Use night lights. Install grab bars by the toilet and in the tub and shower. Do not use towel bars as grab bars. Use non-skid mats or decals in the tub or shower. If you need to sit down in the shower, use a  plastic, non-slip stool. Keep the floor dry. Clean up any water that spills on the floor as soon as it happens. Remove soap buildup in the tub or shower regularly. Attach bath mats securely with double-sided non-slip rug tape. Do not have throw rugs and other things on the floor that can make you trip. What can I do in the bedroom? Use night lights. Make sure that you have a light by your bed that is easy to reach. Do not use any sheets or blankets that are too big for your bed. They should not hang down onto the floor. Have a firm chair that has side arms. You can use this for support while you get dressed. Do not have throw rugs and other things on the floor that can make you trip. What can I do in the kitchen? Clean up any spills right away. Avoid walking on wet floors. Keep items that you use a lot in easy-to-reach places. If you need to reach something above you, use a strong step stool that has a grab bar. Keep electrical cords out of the way. Do not use floor polish or wax that makes floors slippery. If you must use wax, use non-skid floor wax. Do not have throw rugs and other things on the floor that can make you trip. What can I do with my stairs? Do not leave any items on the stairs. Make sure that there are handrails on both sides of the stairs and use them. Fix handrails that are broken or loose.  Make sure that handrails are as long as the stairways. Check any carpeting to make sure that it is firmly attached to the stairs. Fix any carpet that is loose or worn. Avoid having throw rugs at the top or bottom of the stairs. If you do have throw rugs, attach them to the floor with carpet tape. Make sure that you have a light switch at the top of the stairs and the bottom of the stairs. If you do not have them, ask someone to add them for you. What else can I do to help prevent falls? Wear shoes that: Do not have high heels. Have rubber bottoms. Are comfortable and fit you  well. Are closed at the toe. Do not wear sandals. If you use a stepladder: Make sure that it is fully opened. Do not climb a closed stepladder. Make sure that both sides of the stepladder are locked into place. Ask someone to hold it for you, if possible. Clearly mark and make sure that you can see: Any grab bars or handrails. First and last steps. Where the edge of each step is. Use tools that help you move around (mobility aids) if they are needed. These include: Canes. Walkers. Scooters. Crutches. Turn on the lights when you go into a dark area. Replace any light bulbs as soon as they burn out. Set up your furniture so you have a clear path. Avoid moving your furniture around. If any of your floors are uneven, fix them. If there are any pets around you, be aware of where they are. Review your medicines with your doctor. Some medicines can make you feel dizzy. This can increase your chance of falling. Ask your doctor what other things that you can do to help prevent falls. This information is not intended to replace advice given to you by your health care provider. Make sure you discuss any questions you have with your health care provider. Document Released: 04/06/2009 Document Revised: 11/16/2015 Document Reviewed: 07/15/2014 Elsevier Interactive Patient Education  2017 [employer].

## 2021-03-22 ENCOUNTER — Telehealth (INDEPENDENT_AMBULATORY_CARE_PROVIDER_SITE_OTHER): Payer: Medicare Other | Admitting: Family Medicine

## 2021-03-22 DIAGNOSIS — U071 COVID-19: Secondary | ICD-10-CM

## 2021-03-22 MED ORDER — BENZONATATE 100 MG PO CAPS: 100.0000 mg | ORAL_CAPSULE | Freq: Three times a day (TID) | ORAL | 0 refills | Status: DC | PRN

## 2021-03-22 MED ORDER — MOLNUPIRAVIR EUA 200MG CAPSULE: 4.0000 | ORAL_CAPSULE | Freq: Two times a day (BID) | ORAL | 0 refills | Status: AC

## 2021-03-22 NOTE — Progress Notes (Signed)
Virtual Visit via Telephone Note  I connected with Walter Hess on 03/22/21 at 10:40 AM EDT by telephone and verified that I am speaking with the correct person using two identifiers.   I discussed the limitations, risks, security and privacy concerns of performing an evaluation and management service by telephone and the availability of in person appointments. I also discussed with the patient that there may be a patient responsible charge related to this service. The patient expressed understanding and agreed to proceed.  Location patient: home, [place] Location provider: work or home office Participants present for the call: patient, provider, pt's wife Patient did not have a visit with me in the prior 7 days to address this/these issue(s).   History of Present Illness:  Acute telemedicine visit for Covid19: -Onset: yesterday -Symptoms include:sinus congestion, fever up to 101 yesterday (normal temp today), runny nose, mild cough -Denies: CP, SOB, NVD, inability to eat/drink/get out of bed -Has tried: -Pertinent past medical history: see below -Pertinent medication allergies: Not on File -COVID-19 vaccine status: 2 doses and 1 booster -GFR ~ 50  Past Medical History:  Diagnosis Date   Anemia    former blood donor for decades; stopped 2006   Diverticulosis 2005   Dr. Olevia Perches   Elevated prostate specific antigen (PSA)    Dr. Amalia Hailey [facility] patient states due to infection   Gilbert's syndrome    Hyperlipidemia       Observations/Objective: Patient sounds cheerful and well on the phone. I do not appreciate any SOB. Speech and thought processing are grossly intact. Patient reported vitals:  Assessment and Plan:  COVID-19   Discussed treatment options (infusions and oral options and risk of drug interactions), ideal treatment window, potential complications, isolation and precautions for COVID-19.  Discussed possibility of rebound with antivirals and the need to reisolate if  it should occur for 5 days. Checked for/reviewed any labs done in the last 90 days with GFR listed in HPI if available. After lengthy discussion, the patient opted for treatment with molnupiravir due to being higher risk for complications of covid or severe disease and other factors. HE preferred this over Paxlovid. Discussed EUA status of this drug and the fact that there is preliminary limited knowledge of risks/interactions/side effects per EUA document vs possible benefits and precautions. This information was shared with patient during the visit and also was provided in patient instructions. Also, advised that patient discuss risks/interactions and use with pharmacist/treatment team as well.  The patient did want a prescription for cough, Tessalon Rx sent.  Other symptomatic care measures summarized in patient instructions.  Advised to seek prompt in person care if worsening, new symptoms arise, or if is not improving with treatment. Advised of options for inperson care in case PCP office not available. Did let the patient know that I only do telemedicine shifts for [place] on Tuesdays and Thursdays and advised a follow up visit with PCP or at an [facility] if has further questions or concerns.   Follow Up Instructions:  I did not refer this patient for an OV with me in the next 24 hours for this/these issue(s).  I discussed the assessment and treatment plan with the patient. The patient was provided an opportunity to ask questions and all were answered. The patient agreed with the plan and demonstrated an understanding of the instructions.   I spent 17 minutes on the date of this visit in the care of this patient. See summary of tasks completed to properly care  for this patient in the detailed notes above which also included counseling of above, review of PMH, medications, allergies, evaluation of the patient and ordering and/or  instructing patient on testing and care options.     Lucretia Kern, DO

## 2021-03-22 NOTE — Patient Instructions (Signed)
HOME CARE TIPS:  -[place] [facility] testing information: https://www.[email or web address]-powers.org/ OR [phone] Most pharmacies also offer testing and home test kits. If the Covid19 test is positive, please make a prompt follow up visit with your primary care office or with [facility] to discuss treatment options. Treatments for Covid19 are best given early in the course of the illness.   -I sent the medication(s) we discussed to your pharmacy: Meds ordered this encounter  Medications   molnupiravir EUA (LAGEVRIO) 200 mg CAPS capsule    Sig: Take 4 capsules (800 mg total) by mouth 2 (two) times daily for 5 days.    Dispense:  40 capsule    Refill:  0   benzonatate (TESSALON PERLES) 100 MG capsule    Sig: Take 1 capsule (100 mg total) by mouth 3 (three) times daily as needed.    Dispense:  20 capsule    Refill:  0     -I sent in the [place] treatment or referral you requested per our discussion. Please see the information provided below and discuss further with the pharmacist/treatment team.   -there is a chance of rebound illness after finishing your treatment. If you become sick again please isolate for an additional 5 days.    -can use tylenol if needed for fevers, aches and pains per instructions  -can use nasal saline a few times per day if you have nasal congestion  -stay hydrated, drink plenty of fluids and eat small healthy meals - avoid dairy  -follow up with your doctor in 2-3 days unless improving and feeling better  -stay home while sick, except to seek medical care. If you have COVID19, ideally it would be best to stay home for a full 10 days since the onset of symptoms PLUS one day of no fever and feeling better. Wear a good mask that fits snugly (such as N95 or KN95) if around others to reduce the risk of transmission.  It was nice to meet you today, and I really hope you are feeling better soon. I help [facility] out with telemedicine visits on  Tuesdays and Thursdays and am available for visits on those days. If you have any concerns or questions following this visit please schedule a follow up visit with your Primary Care doctor or seek care at a local urgent care clinic to avoid delays in care.    Seek in person care or schedule a follow up video visit promptly if your symptoms worsen, new concerns arise or you are not improving with treatment. Call 911 and/or seek emergency care if your symptoms are severe or life threatening.    Fact Sheet for Patients And Caregivers Emergency Use Authorization (EUA) Of LAGEVRIOT (molnupiravir) capsules For Coronavirus Disease 2019 (COVID-19)  What is the most important information I should know about LAGEVRIO? LAGEVRIO may cause serious side effects, including: ? LAGEVRIO may cause harm to your unborn baby. It is not known if LAGEVRIO will harm your baby if you take LAGEVRIO during pregnancy. o LAGEVRIO is not recommended for use in pregnancy. o LAGEVRIO has not been studied in pregnancy. LAGEVRIO was studied in pregnant animals only. When LAGEVRIO was given to pregnant animals, LAGEVRIO caused harm to their unborn babies. o You and your healthcare provider may decide that you should take LAGEVRIO during pregnancy if there are no other COVID-19 treatment options approved or authorized by the FDA that are accessible or clinically appropriate for you. o If you and your healthcare provider decide that you should take [place]  during pregnancy, you and your healthcare provider should discuss the known and potential benefits and the potential risks of taking LAGEVRIO during pregnancy. For individuals who are able to become pregnant: ? You should use a reliable method of birth control (contraception) consistently and correctly during treatment with LAGEVRIO and for 4 days after the last dose of LAGEVRIO. Talk to your healthcare provider about reliable birth control methods. ? Before starting  treatment with [facility] your healthcare provider may do a pregnancy test to see if you are pregnant before starting treatment with LAGEVRIO. ? Tell your healthcare provider right away if you become pregnant or think you may be pregnant during treatment with LAGEVRIO. Pregnancy Surveillance Program: ? There is a pregnancy surveillance program for individuals who take LAGEVRIO during pregnancy. The purpose of this program is to collect information about the health of you and your baby. Talk to your healthcare provider about how to take part in this program. ? If you take LAGEVRIO during pregnancy and you agree to participate in the pregnancy surveillance program and allow your healthcare provider to share your information with [place], then your healthcare provider will report your use of [place] during pregnancy to [place]. by calling [phone] or PeacefulBlog.es. For individuals who are sexually active with partners who are able to become pregnant: ? It is not known if LAGEVRIO can affect sperm. While the risk is regarded as low, animal studies to fully assess the potential for LAGEVRIO to affect the babies of males treated with LAGEVRIO have not been completed. A reliable method of birth control (contraception) should be used consistently and correctly during treatment with LAGEVRIO and for at least 3 months after the last dose. The risk to sperm beyond 3 months is not known. Studies to understand the risk to sperm beyond 3 months are ongoing. Talk to your healthcare provider about reliable birth control methods. Talk to your healthcare provider if you have questions or concerns about how LAGEVRIO may affect sperm. You are being given this fact sheet because your healthcare provider believes it is necessary to provide you with LAGEVRIO for the treatment of adults with mild-to-moderate coronavirus disease 2019 (COVID-19) with positive results of  direct SARS-CoV-2 viral testing, and who are at high risk for progression to severe COVID-19 including hospitalization or death, and for whom other COVID-19 treatment options approved or authorized by the FDA are not accessible or clinically appropriate. The U.S. Food and Drug Administration (FDA) has issued an Emergency Use Authorization (EUA) to make LAGEVRIO available during the COVID-19 pandemic (for more details about an EUA please see "What is an Emergency Use Authorization?" at the end of this document). LAGEVRIO is not an FDA-approved medicine in the [place]. Read this Fact Sheet for information about LAGEVRIO. Talk to your healthcare provider about your options if you have any questions. It is your choice to take LAGEVRIO.  What is COVID-19? COVID-19 is caused by a virus called a coronavirus. You can get COVID-19 through close contact with another person who has the virus. COVID-19 illnesses have ranged from very mild-to-severe, including illness resulting in death. While information so far suggests that most COVID-19 illness is mild, serious illness can happen and may cause some of your other medical conditions to become worse. Older people and people of all ages with severe, long lasting (chronic) medical conditions like heart disease, lung disease and diabetes, for example seem to be at higher risk of being hospitalized for COVID-19.  What is LAGEVRIO? LAGEVRIO is an investigational medicine used to treat mild-to-moderate COVID-19 in adults: ? with positive results of direct SARS-CoV-2 viral testing, and ? who are at high risk for progression to severe COVID-19 including hospitalization or death, and for whom other COVID-19 treatment options approved or authorized by the FDA are not accessible or clinically appropriate. The FDA has authorized the emergency use of LAGEVRIO for the treatment of mild-tomoderate COVID-19 in adults under an EUA. For more information on EUA,  see the "What is an Emergency Use Authorization (EUA)?" section at the end of this Fact Sheet. LAGEVRIO is not authorized: ? for use in people less than 78 years of age. ? for prevention of COVID-19. ? for people needing hospitalization for COVID-19. ? for use for longer than 5 consecutive days.  What should I tell my healthcare provider before I take LAGEVRIO? Tell your healthcare provider if you: ? Have any allergies ? Are breastfeeding or plan to breastfeed ? Have any serious illnesses ? Are taking any medicines (prescription, over-the-counter, vitamins, or herbal products).  How do I take LAGEVRIO? ? Take LAGEVRIO exactly as your healthcare provider tells you to take it. ? Take 4 capsules of LAGEVRIO every 12 hours (for example, at 8 am and at 8 pm) ? Take LAGEVRIO for 5 days. It is important that you complete the full 5 days of treatment with LAGEVRIO. Do not stop taking LAGEVRIO before you complete the full 5 days of treatment, even if you feel better. ? Take LAGEVRIO with or without food. ? You should stay in isolation for as long as your healthcare provider tells you to. Talk to your healthcare provider if you are not sure about how to properly isolate while you have COVID-19. ? Swallow LAGEVRIO capsules whole. Do not open, break, or crush the capsules. If you cannot swallow capsules whole, tell your healthcare provider. ? What to do if you miss a dose: o If it has been less than 10 hours since the missed dose, take it as soon as you remember o If it has been more than 10 hours since the missed dose, skip the missed dose and take your dose at the next scheduled time. ? Do not double the dose of LAGEVRIO to make up for a missed dose.  What are the important possible side effects of LAGEVRIO? ? See, "What is the most important information I should know about LAGEVRIO?" ? Allergic Reactions. Allergic reactions can happen in people taking LAGEVRIO, even after only 1 dose.  Stop taking LAGEVRIO and call your healthcare provider right away if you get any of the following symptoms of an allergic reaction: o hives o rapid heartbeat o trouble swallowing or breathing o swelling of the mouth, lips, or face o throat tightness o hoarseness o skin rash The most common side effects of LAGEVRIO are: ? diarrhea ? nausea ? dizziness These are not all the possible side effects of LAGEVRIO. Not many people have taken LAGEVRIO. Serious and unexpected side effects may happen. This medicine is still being studied, so it is possible that all of the risks are not known at this time.  What other treatment choices are there?  Veklury (remdesivir) is FDA-approved as an intravenous (IV) infusion for the treatment of mildto-moderate XLKGM-01 in certain adults and children. Talk with your doctor to see if Marijean Heath is appropriate for you. Like LAGEVRIO, FDA may also allow for the emergency use of other medicines to treat people with COVID-19. Go to  LacrosseProperties.si for more information. It is your choice to be treated or not to be treated with LAGEVRIO. Should you decide not to take it, it will not change your standard medical care.  What if I am breastfeeding? Breastfeeding is not recommended during treatment with LAGEVRIO and for 4 days after the last dose of LAGEVRIO. If you are breastfeeding or plan to breastfeed, talk to your healthcare provider about your options and specific situation before taking LAGEVRIO.  How do I report side effects with LAGEVRIO? Contact your healthcare provider if you have any side effects that bother you or do not go away. Report side effects to FDA MedWatch at SmoothHits.hu or call 1-800-FDA-1088 (1- 250-767-8074).  How should I store [place]? ? Store LAGEVRIO capsules at room temperature between 6F to 22F (20C to 25C). ?  Keep LAGEVRIO and all medicines out of the reach of children and pets. How can I learn more about COVID-19? ? Ask your healthcare provider. ? Visit SeekRooms.co.uk ? Contact your local or state public health department. ? Call [place] at [phone] (toll free in the [place].S.) ? Visit www.molnupiravir.com  What Is an Emergency Use Authorization (EUA)? The [place] FDA has made [place] available under an emergency access mechanism called an Emergency Use Authorization (EUA) The EUA is supported by a Presenter, broadcasting Health and Human Service (HHS) declaration that circumstances exist to justify emergency use of drugs and biological products during the COVID-19 pandemic. LAGEVRIO for the treatment of mild-to-moderate COVID-19 in adults with positive results of direct SARS-CoV-2 viral testing, who are at high risk for progression to severe COVID-19, including hospitalization or death, and for whom alternative COVID-19 treatment options approved or authorized by FDA are not accessible or clinically appropriate, has not undergone the same type of review as an FDA-approved product. In issuing an EUA under the MKTLZ-30 public health emergency, the FDA has determined, among other things, that based on the total amount of scientific evidence available including data from adequate and well-controlled clinical trials, if available, it is reasonable to believe that the product may be effective for diagnosing, treating, or preventing COVID-19, or a serious or life-threatening disease or condition caused by COVID-19; that the known and potential benefits of the product, when used to diagnose, treat, or prevent such disease or condition, outweigh the known and potential risks of such product; and that there are no adequate, approved, and available alternatives.  All of these criteria must be met to allow for the product to be used in the treatment of patients during the COVID-19 pandemic.  The EUA for LAGEVRIO is in effect for the duration of the COVID-19 declaration justifying emergency use of LAGEVRIO, unless terminated or revoked (after which LAGEVRIO may no longer be used under the EUA). For patent information: http://rogers.info/ Copyright  2021-2022 [place], [place] and its affiliates. All rights reserved. [id number] Revised: March 2022

## 2021-04-06 ENCOUNTER — Other Ambulatory Visit (INDEPENDENT_AMBULATORY_CARE_PROVIDER_SITE_OTHER): Payer: Medicare Other

## 2021-04-06 DIAGNOSIS — Z23 Encounter for immunization: Secondary | ICD-10-CM | POA: Diagnosis not present

## 2021-05-01 DIAGNOSIS — E7800 Pure hypercholesterolemia, unspecified: Secondary | ICD-10-CM | POA: Diagnosis not present

## 2021-05-01 DIAGNOSIS — H50021 Monocular esotropia with A pattern, right eye: Secondary | ICD-10-CM | POA: Diagnosis not present

## 2021-05-01 DIAGNOSIS — H5053 Vertical heterophoria: Secondary | ICD-10-CM | POA: Diagnosis not present

## 2021-05-01 DIAGNOSIS — H524 Presbyopia: Secondary | ICD-10-CM | POA: Diagnosis not present

## 2021-05-01 DIAGNOSIS — H50022 Monocular esotropia with A pattern, left eye: Secondary | ICD-10-CM | POA: Diagnosis not present

## 2021-05-01 DIAGNOSIS — H5203 Hypermetropia, bilateral: Secondary | ICD-10-CM | POA: Diagnosis not present

## 2021-05-01 DIAGNOSIS — H52223 Regular astigmatism, bilateral: Secondary | ICD-10-CM | POA: Diagnosis not present

## 2021-05-01 DIAGNOSIS — H50112 Monocular exotropia, left eye: Secondary | ICD-10-CM | POA: Diagnosis not present

## 2021-05-16 DIAGNOSIS — M13849 Other specified arthritis, unspecified hand: Secondary | ICD-10-CM | POA: Diagnosis not present

## 2021-05-16 DIAGNOSIS — M79641 Pain in right hand: Secondary | ICD-10-CM | POA: Diagnosis not present

## 2021-05-16 DIAGNOSIS — M720 Palmar fascial fibromatosis [Dupuytren]: Secondary | ICD-10-CM | POA: Diagnosis not present

## 2021-05-16 DIAGNOSIS — M79642 Pain in left hand: Secondary | ICD-10-CM | POA: Diagnosis not present

## 2021-05-17 DIAGNOSIS — M19049 Primary osteoarthritis, unspecified hand: Secondary | ICD-10-CM | POA: Insufficient documentation

## 2021-06-05 ENCOUNTER — Other Ambulatory Visit (HOSPITAL_BASED_OUTPATIENT_CLINIC_OR_DEPARTMENT_OTHER): Payer: Self-pay

## 2021-06-05 ENCOUNTER — Ambulatory Visit: Payer: Medicare Other | Attending: Internal Medicine

## 2021-06-05 DIAGNOSIS — Z23 Encounter for immunization: Secondary | ICD-10-CM

## 2021-06-05 MED ORDER — PFIZER COVID-19 VAC BIVALENT 30 MCG/0.3ML IM SUSP
INTRAMUSCULAR | 0 refills | Status: DC
  Filled 2021-06-05: qty 0.3, 1d supply, fill #0

## 2021-06-05 NOTE — Progress Notes (Signed)
° °  Covid-19 Vaccination Clinic  Name:  Walter Hess    MRN: [id number] DOB: [birth year]  06/05/2021  Walter Hess was observed post Covid-19 immunization for 15 minutes without incident. He was provided with Vaccine Information Sheet and instruction to access the V-Safe system.   Walter Hess was instructed to call 911 with any severe reactions post vaccine: Difficulty breathing  Swelling of face and throat  A fast heartbeat  A bad rash all over body  Dizziness and weakness   Immunizations Administered     Name Date Dose VIS Date Route   Pfizer Covid-19 Vaccine Bivalent Booster 06/05/2021  1:34 PM 0.3 mL 02/21/2021 Intramuscular   Manufacturer: [place]   Lot: [id number]   [place]: [phone]

## 2021-06-14 DIAGNOSIS — L821 Other seborrheic keratosis: Secondary | ICD-10-CM | POA: Diagnosis not present

## 2021-06-14 DIAGNOSIS — L858 Other specified epidermal thickening: Secondary | ICD-10-CM | POA: Diagnosis not present

## 2021-06-14 DIAGNOSIS — L720 Epidermal cyst: Secondary | ICD-10-CM | POA: Diagnosis not present

## 2021-06-14 DIAGNOSIS — L400 Psoriasis vulgaris: Secondary | ICD-10-CM | POA: Diagnosis not present

## 2021-07-01 ENCOUNTER — Other Ambulatory Visit: Payer: Self-pay | Admitting: Internal Medicine

## 2021-07-02 DIAGNOSIS — M720 Palmar fascial fibromatosis [Dupuytren]: Secondary | ICD-10-CM | POA: Diagnosis not present

## 2021-07-04 DIAGNOSIS — Z4789 Encounter for other orthopedic aftercare: Secondary | ICD-10-CM | POA: Diagnosis not present

## 2021-07-04 DIAGNOSIS — M25641 Stiffness of right hand, not elsewhere classified: Secondary | ICD-10-CM | POA: Diagnosis not present

## 2021-07-04 DIAGNOSIS — R2231 Localized swelling, mass and lump, right upper limb: Secondary | ICD-10-CM | POA: Diagnosis not present

## 2021-08-02 DIAGNOSIS — M720 Palmar fascial fibromatosis [Dupuytren]: Secondary | ICD-10-CM | POA: Diagnosis not present

## 2021-08-02 DIAGNOSIS — Z4789 Encounter for other orthopedic aftercare: Secondary | ICD-10-CM | POA: Diagnosis not present

## 2021-08-02 DIAGNOSIS — M19041 Primary osteoarthritis, right hand: Secondary | ICD-10-CM | POA: Diagnosis not present

## 2021-08-06 ENCOUNTER — Other Ambulatory Visit: Payer: Self-pay | Admitting: Internal Medicine

## 2021-08-10 ENCOUNTER — Ambulatory Visit: Payer: Medicare Other | Admitting: Internal Medicine

## 2021-08-14 ENCOUNTER — Other Ambulatory Visit: Payer: Self-pay

## 2021-08-14 ENCOUNTER — Other Ambulatory Visit (INDEPENDENT_AMBULATORY_CARE_PROVIDER_SITE_OTHER): Payer: Medicare Other

## 2021-08-14 DIAGNOSIS — D649 Anemia, unspecified: Secondary | ICD-10-CM | POA: Diagnosis not present

## 2021-08-14 DIAGNOSIS — K219 Gastro-esophageal reflux disease without esophagitis: Secondary | ICD-10-CM | POA: Diagnosis not present

## 2021-08-14 DIAGNOSIS — E782 Mixed hyperlipidemia: Secondary | ICD-10-CM

## 2021-08-14 DIAGNOSIS — E119 Type 2 diabetes mellitus without complications: Secondary | ICD-10-CM | POA: Diagnosis not present

## 2021-08-14 DIAGNOSIS — N1831 Chronic kidney disease, stage 3a: Secondary | ICD-10-CM | POA: Diagnosis not present

## 2021-08-14 LAB — COMPREHENSIVE METABOLIC PANEL
ALT: 15 U/L (ref 0–53)
AST: 21 U/L (ref 0–37)
Albumin: 4 g/dL (ref 3.5–5.2)
Alkaline Phosphatase: 36 U/L — ABNORMAL LOW (ref 39–117)
BUN: 18 mg/dL (ref 6–23)
CO2: 31 mEq/L (ref 19–32)
Calcium: 9.4 mg/dL (ref 8.4–10.5)
Chloride: 104 mEq/L (ref 96–112)
Creatinine, Ser: 1.34 mg/dL (ref 0.40–1.50)
GFR: 49.12 mL/min — ABNORMAL LOW (ref >60.00)
Glucose, Bld: 132 mg/dL — ABNORMAL HIGH (ref 70–99)
Potassium: 4 mEq/L (ref 3.5–5.1)
Sodium: 140 mEq/L (ref 135–145)
Total Bilirubin: 0.9 mg/dL (ref 0.2–1.2)
Total Protein: 7.1 g/dL (ref 6.0–8.3)

## 2021-08-14 LAB — URINALYSIS, ROUTINE W REFLEX MICROSCOPIC
Bilirubin Urine: NEGATIVE
Hgb urine dipstick: NEGATIVE
Ketones, ur: NEGATIVE
Leukocytes,Ua: NEGATIVE
Nitrite: NEGATIVE
RBC / HPF: NONE SEEN (ref 0–?)
Specific Gravity, Urine: 1.025 (ref 1.000–1.030)
Total Protein, Urine: NEGATIVE
Urine Glucose: NEGATIVE
Urobilinogen, UA: 0.2 (ref 0.0–1.0)
pH: 5.5 (ref 5.0–8.0)

## 2021-08-14 LAB — MICROALBUMIN / CREATININE URINE RATIO
Creatinine,U: 164.5 mg/dL
Microalb Creat Ratio: 0.7 mg/g (ref 0.0–30.0)
Microalb, Ur: 1.1 mg/dL (ref 0.0–1.9)

## 2021-08-14 LAB — LIPID PANEL
Cholesterol: 108 mg/dL (ref 0–200)
HDL: 49.1 mg/dL (ref >39.00)
LDL Cholesterol: 47 mg/dL (ref 0–99)
NonHDL: 59.24
Total CHOL/HDL Ratio: 2
Triglycerides: 61 mg/dL (ref 0.0–149.0)
VLDL: 12.2 mg/dL (ref 0.0–40.0)

## 2021-08-14 LAB — HEMOGLOBIN A1C: Hgb A1c MFr Bld: 6.7 % — ABNORMAL HIGH (ref 4.6–6.5)

## 2021-08-15 LAB — IRON,TIBC AND FERRITIN PANEL
%SAT: 20 % (calc) (ref 20–48)
Ferritin: 13 ng/mL — ABNORMAL LOW (ref 24–380)
Iron: 68 ug/dL (ref 50–180)
TIBC: 348 mcg/dL (calc) (ref 250–425)

## 2021-08-20 ENCOUNTER — Ambulatory Visit (INDEPENDENT_AMBULATORY_CARE_PROVIDER_SITE_OTHER): Payer: Medicare Other | Admitting: Internal Medicine

## 2021-08-20 ENCOUNTER — Other Ambulatory Visit: Payer: Self-pay

## 2021-08-20 ENCOUNTER — Encounter: Payer: Self-pay | Admitting: Internal Medicine

## 2021-08-20 DIAGNOSIS — J8410 Pulmonary fibrosis, unspecified: Secondary | ICD-10-CM

## 2021-08-20 NOTE — Patient Instructions (Addendum)
° ° ° ° °  Medications changes include :   None     Return in about 6 months (around 02/18/2022) for follow up, schedule labs prior.

## 2021-08-20 NOTE — Progress Notes (Signed)
Subjective:     Patient ID: Walter Hess, male   DOB: 07-Dec-1940     MRN: [id number]    Brief patient profile:  62  yowm never regular smoker stopped completely  in his 20's and no lower resp problems just sinus symptoms starting in 40s with recurrent infections  but no need for  specialty eval or need for rx then routine cxr suggested lung nodule in 01/2015 repeat in 01/2016 suggested PF so referred to pulmonary clinic 02/29/2016 by Dr  Quay Burow with dx of  PF not typical of UIP (non dx HRCT)     History of Present Illness  02/29/2016 1st [facility] office visit/ Walter Hess   Chief Complaint  Patient presents with   Pulmonary Consult    Referred by Dr. Billey Gosling for abnormal cxr. Pt denies any respiratory co's today.    6 days a week does 5 degrees of elevation at 3-4 mph x 25 min s sob x 40 year pattern s change rec Stay as active as you can and let me know if you are  losing ground with exercise or your oxygen saturation is dropping below your baseline   - above 90% is normal - pulse oximeter is the name of the instrument      02/07/2021  f/u ov/Walter Hess re:  PF  UIP vs NSIP favor former  Chief Complaint  Patient presents with   Follow-up    Patient denies any concerns.   Dyspnea:  51 m s stopping with sats no lower than 90% Cough: none  Sleeping: fine flat SABA use: none  02: none  Covid status:   vax x 3  ENT [facility] but has not seen re hoarseness  Rec Make sure you check your oxygen saturation at your highest level of activity to be sure it stays over 90%  Had covid 02/2021    08/20/2021  f/u ov/Walter Hess re: UIP vs NSIP   maint on pepcid 40 mg one daily  per Dr Quay Burow  Chief Complaint  Patient presents with   Follow-up    Doing well  Dyspnea:  treadmill 6 days per week at 4 degrees elevation x 22-25 min at 3-3.5  Cough: one and done > mucus  Sleeping: fine 4 in bed blocks  SABA use: none  02: none      No obvious day to day or daytime variability or assoc excess/ purulent  sputum or mucus plugs or hemoptysis or cp or chest tightness, subjective wheeze or overt sinus or hb symptoms.   Sleeping  without nocturnal  or early am exacerbation  of respiratory  c/o's or need for noct saba. Also denies any obvious fluctuation of symptoms with weather or environmental changes or other aggravating or alleviating factors except as outlined above   No unusual exposure hx or h/o childhood pna/ asthma or knowledge of premature birth.  Current Allergies, Complete Past Medical History, Past Surgical History, Family History, and Social History were reviewed in [employer] record.  ROS  The following are not active complaints unless bolded Hoarseness, sore throat, dysphagia, dental problems, itching, sneezing,  nasal congestion or discharge of excess mucus or purulent secretions, ear ache,   fever, chills, sweats, unintended wt loss or wt gain, classically pleuritic or exertional cp,  orthopnea pnd or arm/hand swelling  or leg swelling, presyncope, palpitations, abdominal pain, anorexia, nausea, vomiting, diarrhea  or change in bowel habits or change in bladder habits, change in stools or change in urine,  dysuria, hematuria,  rash, arthralgias better , visual complaints, headache, numbness, weakness or ataxia or problems with walking or coordination,  change in mood or  memory.        Current Meds  Medication Sig   aspirin 81 MG tablet Take 81 mg by mouth daily.   betamethasone dipropionate 0.05 % cream Apply topically 2 (two) times daily as needed.   Cinnamon 500 MG TABS Take by mouth.   Coenzyme Q10 (COQ10) 100 MG CAPS Take by mouth daily.   famotidine (PEPCID) 40 MG tablet TAKE 1 TABLET BY MOUTH EVERY DAY   Multiple Vitamin (MULTIVITAMIN) tablet Take 1 tablet by mouth daily.   rosuvastatin (CRESTOR) 5 MG tablet TAKE 1 TABLET BY MOUTH EVERY DAY   S-Adenosylmethionine (SAM-E) 200 MG TABS Take by mouth daily.   Saw Palmetto 450 MG CAPS Take by mouth daily.    Turmeric 500 MG CAPS Take by mouth daily.               Objective:   Physical Exam  08/20/2021        158   02/07/2021        160  08/07/2020        161   01/27/2020           162  12/11/2018        160 11/25/2017          165  05/27/2017        166  11/25/2016          167  06/27/2016          163 05/30/2016        168   02/29/16 169 lb 12.8 oz (77 kg)  01/25/16 160 lb 8 oz (72.8 kg)  01/23/15 170 lb (77.1 kg)     Vital signs reviewed  08/20/2021  - Note at rest 02 sats  95% on RA   General appearance:    amb wm nad    HEENT : pt wearing mask not removed for exam due to covid -19 concerns.    NECK :  without JVD/Nodes/TM/ nl carotid upstrokes bilaterally   LUNGS: no acc muscle use,  Nl contour chest with trace crackles bases  bilaterally  R> L  without cough on insp or exp maneuvers   CV:  RRR  no s3 or murmur or increase in P2, and no edema   ABD:  soft and nontender with nl inspiratory excursion in the supine position. No bruits or organomegaly appreciated, bowel sounds nl  MS:  Nl gait/ ext warm without deformities, calf tenderness, cyanosis or clubbing No obvious joint restrictions   SKIN: warm and dry without lesions    NEURO:  alert, approp, nl sensorium with  no motor or cerebellar deficits apparent.       Assessment:

## 2021-08-20 NOTE — Progress Notes (Signed)
Subjective:    Patient ID: Walter Hess, male    DOB: Oct 29, 1940, 81 y.o.   MRN: [id number]  This visit occurred during the SARS-CoV-2 public health emergency.  Safety protocols were in place, including screening questions prior to the visit, additional usage of staff PPE, and extensive cleaning of exam room while observing appropriate contact time as indicated for disinfecting solutions.     HPI The patient is here for follow up of their chronic medical problems, including DM, hld, CKD, pulm fibrosis, GERD  He is exercising regularly - 6 days a week.  He is eating well.    He feels like his voice is a little hoarse.   Still has difficulty recalling names.    Medications and allergies reviewed with patient and updated if appropriate.  Patient Active Problem List   Diagnosis Date Noted   Anemia 02/13/2021   Localized osteoarthritis of hand, left 08/14/2020   PAC (premature atrial contraction) 08/14/2020   Cardiomegaly 08/14/2020   Family history of thoracic aortic aneurysm 08/14/2020   Aortic atherosclerosis ([place]) 01/28/2018   Decreased hearing 01/27/2017   GERD (gastroesophageal reflux disease) 01/26/2017   Cough 06/27/2016   Sinusitis, chronic 02/29/2016   Postinflammatory pulmonary fibrosis ([place]) 02/29/2016   Chronic kidney disease 01/30/2016   Lung nodule 01/28/2016   Diabetes ([place]) 01/25/2016   Vertebral basilar insufficiency 01/25/2016   BPH (benign prostatic hyperplasia) 11/27/2011   DUPUYTREN'S CONTRACTURE, RIGHT 10/05/2009   HYPERLIPIDEMIA 06/24/2007   DIVERTICULOSIS, COLON 06/24/2007   PROSTATE SPECIFIC ANTIGEN( PSA), ELEVATED 06/24/2007   NONSPECIFIC ABNORMAL ELECTROCARDIOGRAM 06/24/2007    Current Outpatient Medications on File Prior to Visit  Medication Sig Dispense Refill   aspirin 81 MG tablet Take 81 mg by mouth daily.     betamethasone dipropionate 0.05 % cream Apply topically 2 (two) times daily as needed.     Cinnamon 500 MG TABS Take by  mouth.     Coenzyme Q10 (COQ10) 100 MG CAPS Take by mouth daily.     famotidine (PEPCID) 40 MG tablet TAKE 1 TABLET BY MOUTH EVERY DAY 90 tablet 1   Multiple Vitamin (MULTIVITAMIN) tablet Take 1 tablet by mouth daily.     rosuvastatin (CRESTOR) 5 MG tablet TAKE 1 TABLET BY MOUTH EVERY DAY 90 tablet 1   S-Adenosylmethionine (SAM-E) 200 MG TABS Take by mouth daily.     Saw Palmetto 450 MG CAPS Take by mouth daily.     Turmeric 500 MG CAPS Take by mouth daily.     No current facility-administered medications on file prior to visit.    Past Medical History:  Diagnosis Date   Anemia    former blood donor for decades; stopped 2006   Diverticulosis 2005   Dr. Olevia Perches   Elevated prostate specific antigen (PSA)    Dr. Amalia Hailey [facility] patient states due to infection   Gilbert's syndrome    Hyperlipidemia     Past Surgical History:  Procedure Laterality Date   CATARACT EXTRACTION, BILATERAL  2017   COLONOSCOPY  2005   Diverticulosis, Dr.Brodie.   GANGLION CYST EXCISION  2005   5th L digit   LUMBAR LAMINECTOMY  1981    Social History   Socioeconomic History   Marital status: Married    Spouse name: Not on file   Number of children: 2   Years of education: Not on file   Highest education level: Not on file  Occupational History   Occupation: retired  Tobacco Use  Smoking status: Former    Packs/day: 0.50    Years: 5.00    Pack years: 2.50    Types: Cigarettes    Quit date: 06/25/1963    Years since quitting: 58.1   Smokeless tobacco: Never  Vaping Use   Vaping Use: Never used  Substance and Sexual Activity   Alcohol use: Yes    Alcohol/week: 0.0 standard drinks    Comment: beer occasionally   Drug use: No   Sexual activity: Not Currently  Other Topics Concern   Not on file  Social History Narrative   Not on file   Social Determinants of Health   Financial Resource Strain: Low Risk    Difficulty of Paying Living Expenses: Not hard at all  Food Insecurity: No Food  Insecurity   Worried About Charity fundraiser in the Last Year: Never true   [place] in the Last Year: Never true  Transportation Needs: No Transportation Needs   Lack of Transportation (Medical): No   Lack of Transportation (Non-Medical): No  Physical Activity: Sufficiently Active   Days of Exercise per Week: 6 days   Minutes of Exercise per Session: 30 min  Stress: No Stress Concern Present   Feeling of Stress : Not at all  Social Connections: Socially Integrated   Frequency of Communication with Friends and Family: More than three times a week   Frequency of Social Gatherings with Friends and Family: More than three times a week   Attends Religious Services: More than 4 times per year   Active Member of Clubs or Organizations: Yes   Attends Music therapist: More than 4 times per year   Marital Status: Married    Family History  Problem Relation Age of Onset   Heart attack Father 56       former smoker   Alzheimer's disease Mother    Coronary artery disease Sister        open heart surgery   Diabetes Paternal Aunt    Heart attack Paternal Uncle 32   Coronary artery disease Paternal Grandfather    Prostate cancer Paternal Grandfather    Prostate cancer Maternal Uncle    Stroke Neg Hx    Colon cancer Neg Hx     Review of Systems     Objective:   Vitals:   08/21/21 0955  BP: 136/76  Pulse: 91  Temp: 98.1 F (36.7 C)  SpO2: 92%   BP Readings from Last 3 Encounters:  08/21/21 136/76  08/20/21 126/70  02/27/21 138/60   Wt Readings from Last 3 Encounters:  08/21/21 159 lb 2 oz (72.2 kg)  08/20/21 158 lb (71.7 kg)  02/27/21 160 lb 6.4 oz (72.8 kg)   Body mass index is 22.19 kg/m.   Physical Exam       Lab Results  Component Value Date   WBC 6.8 02/05/2021   HGB 12.4 (L) 02/05/2021   HCT 35.6 (L) 02/05/2021   PLT 212.0 02/05/2021   GLUCOSE 132 (H) 08/14/2021   CHOL 108 08/14/2021   TRIG 61.0 08/14/2021   HDL 49.10 08/14/2021    LDLCALC 47 08/14/2021   ALT 15 08/14/2021   AST 21 08/14/2021   NA 140 08/14/2021   K 4.0 08/14/2021   CL 104 08/14/2021   CREATININE 1.34 08/14/2021   BUN 18 08/14/2021   CO2 31 08/14/2021   TSH 4.20 01/27/2017   HGBA1C 6.7 (H) 08/14/2021   MICROALBUR 1.1 08/14/2021  VAS US CAROTID Carotid Arterial Duplex Study  Indications:  Right bruit.               Patient indicates he has some chronic dizziness and               lightheadedness when tilting his head back when laying down. No               other cerebrovascular symptoms at this time. Risk Factors: Hyperlipidemia, past history of smoking.  Performing Technologist: Mariane Masters RVT    Examination Guidelines: A complete evaluation includes B-mode imaging, spectral Doppler, color Doppler, and power Doppler as needed of all accessible portions of each vessel. Bilateral testing is considered an integral part of a complete examination. Limited examinations for reoccurring indications may be performed as noted.    Right Carotid Findings: +----------+--------+--------+--------+------------------+------------------+             PSV cm/s EDV cm/s Stenosis Plaque Description Comments            +----------+--------+--------+--------+------------------+------------------+  CCA Prox   73       14                                                       +----------+--------+--------+--------+------------------+------------------+  CCA Distal 68       12                                   intimal thickening  +----------+--------+--------+--------+------------------+------------------+  ICA Prox   57       12                                                       +----------+--------+--------+--------+------------------+------------------+  ICA Mid    73       17       Normal                                          +----------+--------+--------+--------+------------------+------------------+  ICA Distal 137      33                                                        +----------+--------+--------+--------+------------------+------------------+  ECA        93       8                                                        +----------+--------+--------+--------+------------------+------------------+  +----------+--------+-------+----------------+-------------------+             PSV cm/s EDV cms Describe         Arm Pressure (mmHG)  +----------+--------+-------+----------------+-------------------+  Subclavian 76  Multiphasic, WNL 120                  +----------+--------+-------+----------------+-------------------+  +---------+--------+--+--------+--+---------+  Vertebral PSV cm/s 63 EDV cm/s 12 Antegrade  +---------+--------+--+--------+--+---------+     Left Carotid Findings: +----------+--------+--------+--------+------------------+------------------+             PSV cm/s EDV cm/s Stenosis Plaque Description Comments            +----------+--------+--------+--------+------------------+------------------+  CCA Prox   100      17                                                       +----------+--------+--------+--------+------------------+------------------+  CCA Distal 79       19                                   intimal thickening  +----------+--------+--------+--------+------------------+------------------+  ICA Prox   60       11                                                       +----------+--------+--------+--------+------------------+------------------+  ICA Mid    104      36       Normal                                          +----------+--------+--------+--------+------------------+------------------+  ICA Distal 88       22                                                       +----------+--------+--------+--------+------------------+------------------+  ECA        76       5                                                         +----------+--------+--------+--------+------------------+------------------+  +----------+--------+--------+----------------+-------------------+             PSV cm/s EDV cm/s Describe         Arm Pressure (mmHG)  +----------+--------+--------+----------------+-------------------+  Subclavian 64                Multiphasic, WNL 120                  +----------+--------+--------+----------------+-------------------+  +---------+--------+--+--------+--+---------+  Vertebral PSV cm/s 46 EDV cm/s 10 Antegrade  +---------+--------+--+--------+--+---------+        Summary: Right Carotid: There is no evidence of stenosis in the right ICA. The                extracranial vessels were near-normal with only minimal wall  thickening or plaque.  Left Carotid: There is no evidence of stenosis in the left ICA. The extracranial               vessels were near-normal with only minimal wall thickening or               plaque.  Vertebrals:  Bilateral vertebral arteries demonstrate antegrade flow. Subclavians: Normal flow hemodynamics were seen in bilateral subclavian              arteries.  *See table(s) above for measurements and observations.    Electronically signed by Quay Burow MD on 09/07/2020 at 4:31:30 PM.      Final   ECHOCARDIOGRAM COMPLETE    ECHOCARDIOGRAM REPORT       Patient Name:   Walter Hess Date of Exam: 09/07/2020 Medical Rec #:  [id number]         Height:       71.0 in Accession #:    [id number]        Weight:       160.0 lb Date of Birth:  1940/09/13          BSA:          1.918 m Patient Age:    23 years          BP:           126/76 mmHg Patient Gender: M                 HR:           59 bpm. Exam Location:  [place]  Procedure: 2D Echo, Cardiac Doppler and Color Doppler  Indications:    I49.1 PAC   History:        Patient has no prior history of Echocardiogram examinations.                 Risk Factors:Dyslipidemia.    Sonographer:    Cresenciano Lick RDCS Referring Phys: [id number] [place]   1. Left ventricular ejection fraction, by estimation, is 60 to 65%. The left ventricle has normal function. The left ventricle has no regional wall motion abnormalities. Left ventricular diastolic parameters are consistent with Grade I diastolic  dysfunction (impaired relaxation).  2. Right ventricular systolic function is normal. The right ventricular size is normal.  3. Left atrial size was mildly dilated.  4. The mitral valve is normal in structure. No evidence of mitral valve regurgitation. No evidence of mitral stenosis.  5. The aortic valve is normal in structure. Aortic valve regurgitation is trivial. No aortic stenosis is present.  6. The inferior vena cava is normal in size with greater than 50% respiratory variability, suggesting right atrial pressure of 3 mmHg.  FINDINGS  Left Ventricle: Left ventricular ejection fraction, by estimation, is 60 to 65%. The left ventricle has normal function. The left ventricle has no regional wall motion abnormalities. The left ventricular internal cavity size was normal in size. There is  no left ventricular hypertrophy. Left ventricular diastolic parameters are consistent with Grade I diastolic dysfunction (impaired relaxation). Normal left ventricular filling pressure.  Right Ventricle: The right ventricular size is normal. No increase in right ventricular wall thickness. Right ventricular systolic function is normal.  Left Atrium: Left atrial size was mildly dilated.  Right Atrium: Right atrial size was normal in size.  Pericardium: There is no evidence of pericardial effusion.  Mitral Valve: The mitral valve is normal in structure. No  evidence of mitral valve regurgitation. No evidence of mitral valve stenosis.  Tricuspid Valve: The tricuspid valve is normal in structure. Tricuspid valve regurgitation is mild . No evidence of tricuspid  stenosis.  Aortic Valve: The aortic valve is normal in structure. Aortic valve regurgitation is trivial. Aortic regurgitation PHT measures 592 msec. No aortic stenosis is present.  Pulmonic Valve: The pulmonic valve was normal in structure. Pulmonic valve regurgitation is not visualized. No evidence of pulmonic stenosis.  Aorta: The aortic root is normal in size and structure.  Venous: The inferior vena cava is normal in size with greater than 50% respiratory variability, suggesting right atrial pressure of 3 mmHg.  IAS/Shunts: No atrial level shunt detected by color flow Doppler.    LEFT VENTRICLE PLAX 2D LVIDd:         5.20 cm  Diastology LVIDs:         3.10 cm  LV e' medial:    8.27 cm/s LV PW:         1.00 cm  LV E/e' medial:  6.5 LV IVS:        0.80 cm  LV e' lateral:   5.55 cm/s LVOT diam:     2.40 cm  LV E/e' lateral: 9.7 LV SV:         97 LV SV Index:   50 LVOT Area:     4.52 cm    RIGHT VENTRICLE RV Basal diam:  4.10 cm RV S prime:     22.20 cm/s TAPSE (M-mode): 2.5 cm  LEFT ATRIUM             Index       RIGHT ATRIUM           Index LA diam:        4.40 cm 2.29 cm/m  RA Area:     14.80 cm LA Vol (A2C):   45.2 ml 23.57 ml/m RA Volume:   40.80 ml  21.27 ml/m LA Vol (A4C):   44.2 ml 23.05 ml/m LA Biplane Vol: 48.3 ml 25.19 ml/m  AORTIC VALVE LVOT Vmax:   115.00 cm/s LVOT Vmean:  72.000 cm/s LVOT VTI:    0.214 m AI PHT:      592 msec   AORTA Ao Root diam: 3.90 cm Ao Asc diam:  3.90 cm  MITRAL VALVE               TRICUSPID VALVE MV Area (PHT): 2.95 cm    TR Peak grad:   22.1 mmHg MV Decel Time: 257 msec    TR Vmax:        235.00 cm/s MV E velocity: 54.00 cm/s MV A velocity: 88.70 cm/s  SHUNTS MV E/A ratio:  0.61        Systemic VTI:  0.21 m                            Systemic Diam: 2.40 cm  Dani Gobble Croitoru MD Electronically signed by Sanda Klein MD Signature Date/Time: 09/07/2020/3:59:12 PM      Final     Assessment & Plan:    See Problem  List for Assessment and Plan of chronic medical problems.

## 2021-08-20 NOTE — Patient Instructions (Addendum)
No change in recommendations - let me know if losing ground with your peak 02 saturation during exercise.  Please schedule a follow up visit in 6  months but call sooner if needed

## 2021-08-20 NOTE — Assessment & Plan Note (Signed)
Incidental finding/ pt asymptomatic Ct scan 01/2016:  No suspicious pulmonary nodules. Changes of pulmonary fibrosis, most pronounced in the mid and lower lung zones. - PFT's  05/30/2016  FEV1 3.07 (101 % ) ratio 81  p 2 % improvement from saba p nothing  prior to study with DLCO  54/59 % corrects to 76 % for alv volume   - HRCT  01/24/2017 1. There is a spectrum of findings compatible with interstitial lung disease, an although the overall appearance is considered a "probable UIP CT pattern", the lack of progression compared to the prior study, lack of frank honeycombing and presence of air trapping raises the possibility of fibrotic phase nonspecific interstitial pneumonia (NSIP).  - PFT's  11/25/2017   FVC 3.80 (91%) with DLCO  57 % corrects to 78 % for alv volume  - PFT's  12/11/2018 FVC 3.75 (91%)   with DLCO  60 % corrects to 73  % for alv volume   -  01/27/2020   Walked RA  2 laps @ approx 229ft each @ fast pace  stopped due to end of study, no sob with sats 89% at end  - HRCT 02/14/2020   Minimal change UIP pattern  -  08/07/2020   Walked RA  2 laps @ approx 280ft each @ fast pace  stopped due to end of study,  no sob with sats  93% at end  - 02/07/2021   Walked on RA x  2  lap(s) =  approx 500 ft @ nl pace, stopped due to end of study  with lowest 02 sats 91% at end   - 08/20/2021 reports no change ex tol on treadmill at home x 40 y and no change in peak 02 sats never below 91% and declined PF clinic . Off ppi  And on H2 prophylactically    Discussed in detail all the  indications, usual  risks and alternatives  relative to the benefits with patient who agrees to proceed with Rx as outlined.              Each maintenance medication was reviewed in detail including emphasizing most importantly the difference between maintenance and prns and under what circumstances the prns are to be triggered using an action plan format where appropriate.  Total time for H and P, chart review, counseling, ) and  generating customized AVS unique to this office visit / same day charting = 25 min

## 2021-08-21 ENCOUNTER — Encounter: Payer: Self-pay | Admitting: Internal Medicine

## 2021-08-21 ENCOUNTER — Ambulatory Visit (INDEPENDENT_AMBULATORY_CARE_PROVIDER_SITE_OTHER): Payer: Medicare Other | Admitting: Internal Medicine

## 2021-08-21 VITALS — BP 136/76 | HR 91 | Temp 98.1°F | Ht 71.0 in | Wt 159.1 lb

## 2021-08-21 DIAGNOSIS — D649 Anemia, unspecified: Secondary | ICD-10-CM | POA: Diagnosis not present

## 2021-08-21 DIAGNOSIS — N1831 Chronic kidney disease, stage 3a: Secondary | ICD-10-CM

## 2021-08-21 DIAGNOSIS — K219 Gastro-esophageal reflux disease without esophagitis: Secondary | ICD-10-CM

## 2021-08-21 DIAGNOSIS — E119 Type 2 diabetes mellitus without complications: Secondary | ICD-10-CM | POA: Diagnosis not present

## 2021-08-21 DIAGNOSIS — E782 Mixed hyperlipidemia: Secondary | ICD-10-CM

## 2021-08-21 DIAGNOSIS — I700 Atherosclerosis of aorta: Secondary | ICD-10-CM | POA: Diagnosis not present

## 2021-08-21 NOTE — Assessment & Plan Note (Addendum)
Chronic Regular exercise and healthy diet encouraged Lipids controlled Continue rosuvastatin 5 mg daily

## 2021-08-21 NOTE — Assessment & Plan Note (Addendum)
Chronic Diet controlled Lab Results  Component Value Date   HGBA1C 6.7 (H) 08/14/2021    Continue healthy diet, regular exercise

## 2021-08-21 NOTE — Assessment & Plan Note (Signed)
Chronic GERD controlled Continue famotidine 40 mg daily 

## 2021-08-21 NOTE — Assessment & Plan Note (Addendum)
Chronic Mild   iron ok Check cbc, iron in 6 months

## 2021-08-21 NOTE — Assessment & Plan Note (Signed)
Chronic Continue crestor 5 mg daily Lab Results  Component Value Date   LDLCALC 47 08/14/2021   Well controlled Eats healthy, continue regular exercise

## 2021-08-21 NOTE — Assessment & Plan Note (Signed)
Chronic ?Stable ?CMP ?

## 2021-12-12 DIAGNOSIS — D692 Other nonthrombocytopenic purpura: Secondary | ICD-10-CM | POA: Diagnosis not present

## 2021-12-12 DIAGNOSIS — L570 Actinic keratosis: Secondary | ICD-10-CM | POA: Diagnosis not present

## 2021-12-12 DIAGNOSIS — L400 Psoriasis vulgaris: Secondary | ICD-10-CM | POA: Diagnosis not present

## 2022-01-05 ENCOUNTER — Other Ambulatory Visit: Payer: Self-pay | Admitting: Internal Medicine

## 2022-01-28 ENCOUNTER — Other Ambulatory Visit: Payer: Self-pay | Admitting: Internal Medicine

## 2022-01-28 DIAGNOSIS — M19042 Primary osteoarthritis, left hand: Secondary | ICD-10-CM | POA: Diagnosis not present

## 2022-01-28 DIAGNOSIS — M720 Palmar fascial fibromatosis [Dupuytren]: Secondary | ICD-10-CM | POA: Diagnosis not present

## 2022-02-22 ENCOUNTER — Other Ambulatory Visit (INDEPENDENT_AMBULATORY_CARE_PROVIDER_SITE_OTHER): Payer: Medicare Other

## 2022-02-22 DIAGNOSIS — D649 Anemia, unspecified: Secondary | ICD-10-CM

## 2022-02-22 DIAGNOSIS — E782 Mixed hyperlipidemia: Secondary | ICD-10-CM | POA: Diagnosis not present

## 2022-02-22 DIAGNOSIS — N1831 Chronic kidney disease, stage 3a: Secondary | ICD-10-CM | POA: Diagnosis not present

## 2022-02-22 DIAGNOSIS — E119 Type 2 diabetes mellitus without complications: Secondary | ICD-10-CM

## 2022-02-22 LAB — CBC WITH DIFFERENTIAL/PLATELET
Basophils Absolute: 0 10*3/uL (ref 0.0–0.1)
Basophils Relative: 0.6 % (ref 0.0–3.0)
Eosinophils Absolute: 0.3 10*3/uL (ref 0.0–0.7)
Eosinophils Relative: 4.7 % (ref 0.0–5.0)
HCT: 36.5 % — ABNORMAL LOW (ref 39.0–52.0)
Hemoglobin: 12.8 g/dL — ABNORMAL LOW (ref 13.0–17.0)
Lymphocytes Relative: 15.2 % (ref 12.0–46.0)
Lymphs Abs: 1.1 10*3/uL (ref 0.7–4.0)
MCHC: 35.2 g/dL (ref 30.0–36.0)
MCV: 101.3 fl — ABNORMAL HIGH (ref 78.0–100.0)
Monocytes Absolute: 0.8 10*3/uL (ref 0.1–1.0)
Monocytes Relative: 11.3 % (ref 3.0–12.0)
Neutro Abs: 4.9 10*3/uL (ref 1.4–7.7)
Neutrophils Relative %: 68.2 % (ref 43.0–77.0)
Platelets: 195 10*3/uL (ref 150.0–400.0)
RBC: 3.6 Mil/uL — ABNORMAL LOW (ref 4.22–5.81)
RDW: 13 % (ref 11.5–15.5)
WBC: 7.2 10*3/uL (ref 4.0–10.5)

## 2022-02-22 LAB — IBC PANEL
Iron: 70 ug/dL (ref 42–165)
Saturation Ratios: 21.7 % (ref 20.0–50.0)
TIBC: 322 ug/dL (ref 250.0–450.0)
Transferrin: 230 mg/dL (ref 212.0–360.0)

## 2022-02-22 LAB — COMPREHENSIVE METABOLIC PANEL
ALT: 14 U/L (ref 0–53)
AST: 22 U/L (ref 0–37)
Albumin: 3.8 g/dL (ref 3.5–5.2)
Alkaline Phosphatase: 34 U/L — ABNORMAL LOW (ref 39–117)
BUN: 22 mg/dL (ref 6–23)
CO2: 28 mEq/L (ref 19–32)
Calcium: 9.2 mg/dL (ref 8.4–10.5)
Chloride: 105 mEq/L (ref 96–112)
Creatinine, Ser: 1.24 mg/dL (ref 0.40–1.50)
GFR: 53.71 mL/min — ABNORMAL LOW (ref >60.00)
Glucose, Bld: 113 mg/dL — ABNORMAL HIGH (ref 70–99)
Potassium: 4.2 mEq/L (ref 3.5–5.1)
Sodium: 139 mEq/L (ref 135–145)
Total Bilirubin: 0.9 mg/dL (ref 0.2–1.2)
Total Protein: 6.7 g/dL (ref 6.0–8.3)

## 2022-02-22 LAB — LIPID PANEL
Cholesterol: 98 mg/dL (ref 0–200)
HDL: 47.4 mg/dL (ref >39.00)
LDL Cholesterol: 41 mg/dL (ref 0–99)
NonHDL: 50.21
Total CHOL/HDL Ratio: 2
Triglycerides: 48 mg/dL (ref 0.0–149.0)
VLDL: 9.6 mg/dL (ref 0.0–40.0)

## 2022-02-22 LAB — FERRITIN: Ferritin: 17.8 ng/mL — ABNORMAL LOW (ref 22.0–322.0)

## 2022-02-22 LAB — HEMOGLOBIN A1C: Hgb A1c MFr Bld: 6.5 % (ref 4.6–6.5)

## 2022-02-27 ENCOUNTER — Encounter: Payer: Self-pay | Admitting: Internal Medicine

## 2022-02-27 NOTE — Progress Notes (Signed)
Subjective:    Patient ID: Walter Hess, male    DOB: [birth year], 81 y.o.   MRN: [id number]     HPI Walter Hess is here for follow up of his chronic medical problems, including DM, hld, CKD, GERD, anemia  Had blood work done already.   He moved to [place] and is doing well.  He is more active. He walks 25 min daily and goes to the gym 6 days a week.  The food is wonderful.   Hoarse a lot of times.  He has chronic sinusitis.  Has h/o reflux but has never felt reflux.    Medications and allergies reviewed with patient and updated if appropriate.  Current Outpatient Medications on File Prior to Visit  Medication Sig Dispense Refill   aspirin 81 MG tablet Take 81 mg by mouth daily.     betamethasone dipropionate 0.05 % cream Apply topically 2 (two) times daily as needed.     Cinnamon 500 MG TABS Take by mouth.     Coenzyme Q10 (COQ10) 100 MG CAPS Take by mouth daily.     famotidine (PEPCID) 40 MG tablet TAKE 1 TABLET BY MOUTH EVERY DAY 90 tablet 1   Multiple Vitamin (MULTIVITAMIN) tablet Take 1 tablet by mouth daily.     rosuvastatin (CRESTOR) 5 MG tablet TAKE 1 TABLET BY MOUTH EVERY DAY 90 tablet 1   S-Adenosylmethionine (SAM-E) 200 MG TABS Take by mouth daily.     Saw Palmetto 450 MG CAPS Take by mouth daily.     Turmeric 500 MG CAPS Take by mouth daily.     No current facility-administered medications on file prior to visit.     Review of Systems  Constitutional:  Negative for fever.  HENT:  Positive for voice change.   Respiratory:  Positive for cough (in the mornings). Negative for shortness of breath and wheezing.   Cardiovascular:  Negative for chest pain, palpitations and leg swelling.  Neurological:  Negative for light-headedness and headaches.       Objective:   Vitals:   02/28/22 1116  BP: 122/62  Pulse: 68  Temp: 98 F (36.7 C)  SpO2: 96%   BP Readings from Last 3 Encounters:  02/28/22 122/62  02/28/22 122/62  08/21/21 136/76   Wt Readings  from Last 3 Encounters:  02/28/22 159 lb 6.4 oz (72.3 kg)  02/28/22 159 lb 6.4 oz (72.3 kg)  08/21/21 159 lb 2 oz (72.2 kg)   Body mass index is 22.23 kg/m.    Physical Exam Constitutional:      General: He is not in acute distress.    Appearance: Normal appearance. He is not ill-appearing.  HENT:     Head: Normocephalic and atraumatic.  Eyes:     Conjunctiva/sclera: Conjunctivae normal.  Cardiovascular:     Rate and Rhythm: Normal rate and regular rhythm.     Heart sounds: Normal heart sounds. No murmur heard. Pulmonary:     Effort: Pulmonary effort is normal. No respiratory distress.     Breath sounds: Normal breath sounds. No wheezing or rales.  Musculoskeletal:     Right lower leg: No edema.     Left lower leg: No edema.  Skin:    General: Skin is warm and dry.     Findings: No rash.  Neurological:     Mental Status: He is alert. Mental status is at baseline.  Psychiatric:        Mood and Affect: Mood normal.  Lab Results  Component Value Date   WBC 7.2 02/22/2022   HGB 12.8 (L) 02/22/2022   HCT 36.5 (L) 02/22/2022   PLT 195.0 02/22/2022   GLUCOSE 113 (H) 02/22/2022   CHOL 98 02/22/2022   TRIG 48.0 02/22/2022   HDL 47.40 02/22/2022   LDLCALC 41 02/22/2022   ALT 14 02/22/2022   AST 22 02/22/2022   NA 139 02/22/2022   K 4.2 02/22/2022   CL 105 02/22/2022   CREATININE 1.24 02/22/2022   BUN 22 02/22/2022   CO2 28 02/22/2022   TSH 4.20 01/27/2017   HGBA1C 6.5 02/22/2022   MICROALBUR 1.1 08/14/2021     Assessment & Plan:    See Problem List for Assessment and Plan of chronic medical problems.

## 2022-02-27 NOTE — Patient Instructions (Addendum)
     Blood work was ordered for you to have done prior to your next visit.    Medications changes include :   start a B complex vitamin daily    Return in about 6 months (around 08/29/2022) for follow up, schedule lab appt prior to next appt.

## 2022-02-28 ENCOUNTER — Ambulatory Visit (INDEPENDENT_AMBULATORY_CARE_PROVIDER_SITE_OTHER): Payer: Medicare Other | Admitting: Internal Medicine

## 2022-02-28 ENCOUNTER — Ambulatory Visit (INDEPENDENT_AMBULATORY_CARE_PROVIDER_SITE_OTHER): Payer: Medicare Other

## 2022-02-28 VITALS — BP 122/62 | HR 68 | Temp 98.0°F | Ht 71.0 in | Wt 159.4 lb

## 2022-02-28 DIAGNOSIS — N1831 Chronic kidney disease, stage 3a: Secondary | ICD-10-CM

## 2022-02-28 DIAGNOSIS — Z0000 Encounter for general adult medical examination without abnormal findings: Secondary | ICD-10-CM | POA: Diagnosis not present

## 2022-02-28 DIAGNOSIS — Z23 Encounter for immunization: Secondary | ICD-10-CM | POA: Diagnosis not present

## 2022-02-28 DIAGNOSIS — E119 Type 2 diabetes mellitus without complications: Secondary | ICD-10-CM

## 2022-02-28 DIAGNOSIS — D649 Anemia, unspecified: Secondary | ICD-10-CM | POA: Diagnosis not present

## 2022-02-28 DIAGNOSIS — R490 Dysphonia: Secondary | ICD-10-CM | POA: Insufficient documentation

## 2022-02-28 DIAGNOSIS — E782 Mixed hyperlipidemia: Secondary | ICD-10-CM | POA: Diagnosis not present

## 2022-02-28 DIAGNOSIS — K219 Gastro-esophageal reflux disease without esophagitis: Secondary | ICD-10-CM

## 2022-02-28 NOTE — Assessment & Plan Note (Addendum)
Chronic Denies GERD-never really had reflux, but had cough and decreased lung function Continue famotidine 40 mg daily 

## 2022-02-28 NOTE — Assessment & Plan Note (Signed)
Chronic LDL well controlled Continue Crestor 5 mg daily Continue healthy diet, regular exercise 

## 2022-02-28 NOTE — Assessment & Plan Note (Signed)
Chronic  Lab Results  Component Value Date   HGBA1C 6.5 02/22/2022   Sugars well controlled Continue diet control Continue regular exercise, diabetic diet

## 2022-02-28 NOTE — Assessment & Plan Note (Signed)
Chronic Mild Slight improvement/stable Does not take any NSAIDs Drinking a good amount of fluids Blood pressure and sugar well controlled

## 2022-02-28 NOTE — Patient Instructions (Signed)
Walter Hess , Thank you for taking time to come for your Medicare Wellness Visit. I appreciate your ongoing commitment to your health goals. Please review the following plan we discussed and let me know if I can assist you in the future.   Screening recommendations/referrals: Colonoscopy: No longer recommended due to age Recommended yearly ophthalmology/optometry visit for glaucoma screening and checkup Recommended yearly dental visit for hygiene and checkup  Vaccinations: Influenza vaccine: 02/28/2022 Pneumococcal vaccine: 10/05/2009, 01/23/2015 Tdap vaccine: 07/01/2014; due every 10 years Shingles vaccine: never done   Covid-19: 07/07/2019, 07/27/2019, 9/29/201, 06/05/2021  Advanced directives: Yes  Conditions/risks identified: Yes  Next appointment: Follow up in one year for your annual wellness visit.   Preventive Care 83 Years and Older, Male  Preventive care refers to lifestyle choices and visits with your health care provider that can promote health and wellness. What does preventive care include? A yearly physical exam. This is also called an annual well check. Dental exams once or twice a year. Routine eye exams. Ask your health care provider how often you should have your eyes checked. Personal lifestyle choices, including: Daily care of your teeth and gums. Regular physical activity. Eating a healthy diet. Avoiding tobacco and drug use. Limiting alcohol use. Practicing safe sex. Taking low doses of aspirin every day. Taking vitamin and mineral supplements as recommended by your health care provider. What happens during an annual well check? The services and screenings done by your health care provider during your annual well check will depend on your age, overall health, lifestyle risk factors, and family history of disease. Counseling  Your health care provider may ask you questions about your: Alcohol use. Tobacco use. Drug use. Emotional well-being. Home and  relationship well-being. Sexual activity. Eating habits. History of falls. Memory and ability to understand (cognition). Work and work Statistician. Screening  You may have the following tests or measurements: Height, weight, and BMI. Blood pressure. Lipid and cholesterol levels. These may be checked every 5 years, or more frequently if you are over 83 years old. Skin check. Lung cancer screening. You may have this screening every year starting at age 83 if you have a 30-pack-year history of smoking and currently smoke or have quit within the past 15 years. Fecal occult blood test (FOBT) of the stool. You may have this test every year starting at age 83. Flexible sigmoidoscopy or colonoscopy. You may have a sigmoidoscopy every 5 years or a colonoscopy every 10 years starting at age 40. Prostate cancer screening. Recommendations will vary depending on your family history and other risks. Hepatitis C blood test. Hepatitis B blood test. Sexually transmitted disease (STD) testing. Diabetes screening. This is done by checking your blood sugar (glucose) after you have not eaten for a while (fasting). You may have this done every 1-3 years. Abdominal aortic aneurysm (AAA) screening. You may need this if you are a current or former smoker. Osteoporosis. You may be screened starting at age 47 if you are at high risk. Talk with your health care provider about your test results, treatment options, and if necessary, the need for more tests. Vaccines  Your health care provider may recommend certain vaccines, such as: Influenza vaccine. This is recommended every year. Tetanus, diphtheria, and acellular pertussis (Tdap, Td) vaccine. You may need a Td booster every 10 years. Zoster vaccine. You may need this after age 41. Pneumococcal 13-valent conjugate (PCV13) vaccine. One dose is recommended after age 83. Pneumococcal polysaccharide (PPSV23) vaccine. One dose is recommended  after age 83 Talk to your  health care provider about which screenings and vaccines you need and how often you need them. This information is not intended to replace advice given to you by your health care provider. Make sure you discuss any questions you have with your health care provider. Document Released: 07/07/2015 Document Revised: 02/28/2016 Document Reviewed: 04/11/2015 Elsevier Interactive Patient Education  2017 [place] Prevention in the Home Falls can cause injuries. They can happen to people of all ages. There are many things you can do to make your home safe and to help prevent falls. What can I do on the outside of my home? Regularly fix the edges of walkways and driveways and fix any cracks. Remove anything that might make you trip as you walk through a door, such as a raised step or threshold. Trim any bushes or trees on the path to your home. Use bright outdoor lighting. Clear any walking paths of anything that might make someone trip, such as rocks or tools. Regularly check to see if handrails are loose or broken. Make sure that both sides of any steps have handrails. Any raised decks and porches should have guardrails on the edges. Have any leaves, snow, or ice cleared regularly. Use sand or salt on walking paths during winter. Clean up any spills in your garage right away. This includes oil or grease spills. What can I do in the bathroom? Use night lights. Install grab bars by the toilet and in the tub and shower. Do not use towel bars as grab bars. Use non-skid mats or decals in the tub or shower. If you need to sit down in the shower, use a plastic, non-slip stool. Keep the floor dry. Clean up any water that spills on the floor as soon as it happens. Remove soap buildup in the tub or shower regularly. Attach bath mats securely with double-sided non-slip rug tape. Do not have throw rugs and other things on the floor that can make you trip. What can I do in the bedroom? Use night  lights. Make sure that you have a light by your bed that is easy to reach. Do not use any sheets or blankets that are too big for your bed. They should not hang down onto the floor. Have a firm chair that has side arms. You can use this for support while you get dressed. Do not have throw rugs and other things on the floor that can make you trip. What can I do in the kitchen? Clean up any spills right away. Avoid walking on wet floors. Keep items that you use a lot in easy-to-reach places. If you need to reach something above you, use a strong step stool that has a grab bar. Keep electrical cords out of the way. Do not use floor polish or wax that makes floors slippery. If you must use wax, use non-skid floor wax. Do not have throw rugs and other things on the floor that can make you trip. What can I do with my stairs? Do not leave any items on the stairs. Make sure that there are handrails on both sides of the stairs and use them. Fix handrails that are broken or loose. Make sure that handrails are as long as the stairways. Check any carpeting to make sure that it is firmly attached to the stairs. Fix any carpet that is loose or worn. Avoid having throw rugs at the top or bottom of the stairs. If you  do have throw rugs, attach them to the floor with carpet tape. Make sure that you have a light switch at the top of the stairs and the bottom of the stairs. If you do not have them, ask someone to add them for you. What else can I do to help prevent falls? Wear shoes that: Do not have high heels. Have rubber bottoms. Are comfortable and fit you well. Are closed at the toe. Do not wear sandals. If you use a stepladder: Make sure that it is fully opened. Do not climb a closed stepladder. Make sure that both sides of the stepladder are locked into place. Ask someone to hold it for you, if possible. Clearly mark and make sure that you can see: Any grab bars or handrails. First and last  steps. Where the edge of each step is. Use tools that help you move around (mobility aids) if they are needed. These include: Canes. Walkers. Scooters. Crutches. Turn on the lights when you go into a dark area. Replace any light bulbs as soon as they burn out. Set up your furniture so you have a clear path. Avoid moving your furniture around. If any of your floors are uneven, fix them. If there are any pets around you, be aware of where they are. Review your medicines with your doctor. Some medicines can make you feel dizzy. This can increase your chance of falling. Ask your doctor what other things that you can do to help prevent falls. This information is not intended to replace advice given to you by your health care provider. Make sure you discuss any questions you have with your health care provider. Document Released: 04/06/2009 Document Revised: 11/16/2015 Document Reviewed: 07/15/2014 Elsevier Interactive Patient Education  2017 [employer].

## 2022-02-28 NOTE — Assessment & Plan Note (Signed)
Chronic Mild Stable We will continue to monitor every 6 months

## 2022-02-28 NOTE — Progress Notes (Signed)
Subjective:   Walter Hess is a 81 y.o. male who presents for Medicare Annual/Subsequent preventive examination.  Review of Systems     Cardiac Risk Factors include: advanced age (>81mn, >>35women);diabetes mellitus;dyslipidemia;family history of premature cardiovascular disease;hypertension;male gender     Objective:    Today's Vitals   02/28/22 1024  BP: 122/62  Pulse: 68  Temp: 98 F (36.7 C)  SpO2: 96%  Weight: 159 lb 6.4 oz (72.3 kg)  Height: '5\' 11"'$  (1.803 m)  PainSc: 0-No pain   Body mass index is 22.23 kg/m.     02/27/2021   10:58 AM 02/24/2020   12:00 PM 02/23/2019   10:04 AM 04/14/2014    1:45 PM  Advanced Directives  Does Patient Have a Medical Advance Directive? Yes Yes Yes Yes  Type of Advance Directive  Living will;Healthcare Power of [place] will Living will;Healthcare Power of Attorney  Does patient want to make changes to medical advance directive? No - Patient declined No - Patient declined    Copy of [place]?  No - copy requested No - copy requested     Current Medications (verified) Outpatient Encounter Medications as of 02/28/2022  Medication Sig   aspirin 81 MG tablet Take 81 mg by mouth daily.   betamethasone dipropionate 0.05 % cream Apply topically 2 (two) times daily as needed.   Cinnamon 500 MG TABS Take by mouth.   Coenzyme Q10 (COQ10) 100 MG CAPS Take by mouth daily.   famotidine (PEPCID) 40 MG tablet TAKE 1 TABLET BY MOUTH EVERY DAY   Multiple Vitamin (MULTIVITAMIN) tablet Take 1 tablet by mouth daily.   rosuvastatin (CRESTOR) 5 MG tablet TAKE 1 TABLET BY MOUTH EVERY DAY   S-Adenosylmethionine (SAM-E) 200 MG TABS Take by mouth daily.   Saw Palmetto 450 MG CAPS Take by mouth daily.   Turmeric 500 MG CAPS Take by mouth daily.   No facility-administered encounter medications on file as of 02/28/2022.    Allergies (verified) Patient has no known allergies.    History: Past Medical History:  Diagnosis Date   Anemia    former blood donor for decades; stopped 2006   Diverticulosis 2005   Dr. BOlevia Perches  Elevated prostate specific antigen (PSA)    [facility] due to infection   Gilbert's syndrome    Hyperlipidemia    Past Surgical History:  Procedure Laterality Date   CATARACT EXTRACTION, BILATERAL  2017   COLONOSCOPY  2005   Diverticulosis, Dr.Brodie.   GANGLION CYST EXCISION  2005   5th L digit   LUMBAR LAMINECTOMY  1981   Family History  Problem Relation Age of Onset   Heart attack Father 535      former smoker   Alzheimer's disease Mother    Coronary artery disease Sister        open heart surgery   Diabetes Paternal Aunt    Heart attack Paternal Uncle 548  Coronary artery disease Paternal Grandfather    Prostate cancer Paternal Grandfather    Prostate cancer Maternal Uncle    Stroke Neg Hx    Colon cancer Neg Hx    Social History   Socioeconomic History   Marital status: Married    Spouse name: Not on file   Number of children: 2   Years of education: Not on file   Highest education level: Not on file  Occupational History   Occupation: retired  Tobacco Use   Smoking status: Former    Packs/day: 0.50    Years: 5.00    Total pack years: 2.50    Types: Cigarettes    Quit date: 06/25/1963    Years since quitting: 58.7   Smokeless tobacco: Never  Vaping Use   Vaping Use: Never used  Substance and Sexual Activity   Alcohol use: Yes    Alcohol/week: 0.0 standard drinks of alcohol    Comment: beer occasionally   Drug use: No   Sexual activity: Not Currently  Other Topics Concern   Not on file  Social History Narrative   Not on file   Social Determinants of Health   Financial Resource Strain: Low Risk  (02/28/2022)   Overall Financial Resource Strain (CARDIA)    Difficulty of Paying Living Expenses: Not hard at all  Food Insecurity: No Food Insecurity (02/28/2022)   Hunger Vital Sign     Worried About Running Out of Food in the Last Year: Never true    Ran Out of Food in the Last Year: Never true  Transportation Needs: No Transportation Needs (02/28/2022)   PRAPARE - Hydrologist (Medical): No    Lack of Transportation (Non-Medical): No  Physical Activity: Sufficiently Active (02/28/2022)   Exercise Vital Sign    Days of Exercise per Week: 6 days    Minutes of Exercise per Session: 30 min  Stress: No Stress Concern Present (02/28/2022)   [place]    Feeling of Stress : Not at all  Social Connections: [place] (02/28/2022)   Social Connection and Isolation Panel [NHANES]    Frequency of Communication with Friends and Family: More than three times a week    Frequency of Social Gatherings with Friends and Family: More than three times a week    Attends Religious Services: More than 4 times per year    Active Member of [employer] or Organizations: Yes    Attends Music therapist: More than 4 times per year    Marital Status: Married    Tobacco Counseling Counseling given: Not Answered   Clinical Intake:  Pre-visit preparation completed: Yes  Pain : No/denies pain Pain Score: 0-No pain     BMI - recorded: 22.23 Nutritional Status: BMI of 19-24  Normal Nutritional Risks: None Diabetes: No  How often do you need to have someone help you when you read instructions, pamphlets, or other written materials from your doctor or pharmacy?: 1 - Never  Diabetic? yes  Interpreter Needed?: No  Information entered by :: Lisette Abu, LPN.   Activities of Daily Living    02/28/2022   10:54 AM  In your present state of health, do you have any difficulty performing the following activities:  Hearing? 0  Vision? 0  Difficulty concentrating or making decisions? 0  Walking or climbing stairs? 0  Dressing or bathing? 0  Doing errands, shopping? 0  Preparing  Food and eating ? N  Using the Toilet? N  In the past six months, have you accidently leaked urine? N  Do you have problems with loss of bowel control? N  Managing your Medications? N  Managing your Finances? N  Housekeeping or managing your Housekeeping? N    Patient Care Team: Binnie Rail, MD as PCP - General (Internal Medicine) Tanda Rockers, MD as Consulting Physician (Pulmonary Disease) Domingo Pulse, MD (Urology) Marica Otter, [place] as Consulting Physician (Optometry)  Indicate any recent Medical Services you may have received from other than [facility] providers in the past year (date may be approximate).     Assessment:   This is a routine wellness examination for [place].  Hearing/Vision screen Hearing Screening - Comments:: Patient wears hearing aids. Vision Screening - Comments:: Wears rx glasses - up to date with routine eye exams with Marica Otter, OD.   Dietary issues and exercise activities discussed: Current Exercise Habits: Home exercise routine;Structured exercise class, Type of exercise: treadmill;stretching;strength training/weights;exercise ball;calisthenics;walking, Time (Minutes): 30, Frequency (Times/Week): 6, Weekly Exercise (Minutes/Week): 180, Intensity: Moderate, Exercise limited by: None identified   Goals Addressed             This Visit's Progress    My goal is to keep being active and independent.        Depression Screen    02/28/2022   10:53 AM 08/21/2021    9:59 AM 02/27/2021   11:33 AM 02/24/2020   11:57 AM 02/23/2019    9:59 AM 02/01/2019    8:22 AM 08/03/2018    8:17 AM  PHQ 2/9 Scores  PHQ - 2 Score 0 0 0 0 0 0 0    Fall Risk    02/28/2022   10:54 AM 08/21/2021    9:59 AM 02/27/2021   11:41 AM 02/24/2020   12:01 PM 08/04/2019    8:32 AM  Fall Risk   Falls in the past year? 0 0 0 0 0  Number falls in past yr: 0  0 0 0  Injury with Fall? 0  0 0   Risk for fall due to : No Fall Risks  No Fall Risks No Fall Risks   Follow up Falls  prevention discussed  Falls evaluation completed Falls evaluation completed     [place]:  Any stairs in or around the home? No  If so, are there any without handrails? No  Home free of loose throw rugs in walkways, pet beds, electrical cords, etc? Yes  Adequate lighting in your home to reduce risk of falls? Yes   ASSISTIVE DEVICES UTILIZED TO PREVENT FALLS:  Life alert? Yes  Use of a cane, walker or w/c? No  Grab bars in the bathroom? Yes  Shower chair or bench in shower? Yes  Elevated toilet seat or a handicapped toilet? Yes   TIMED UP AND GO:  Was the test performed? Yes .  Length of time to ambulate 10 feet: 6 sec.   Gait steady and fast without use of assistive device  Cognitive Function:        02/28/2022   10:55 AM 02/24/2020   12:03 PM  6CIT Screen  What Year? 0 points 0 points  What month? 0 points 0 points  What time? 0 points 0 points  Count back from 20 0 points 0 points  Months in reverse 0 points 0 points  Repeat phrase 0 points 0 points  Total Score 0 points 0 points    Immunizations Immunization History  Administered Date(s) Administered   Fluad Quad(high Dose 65+) 02/26/2019, 03/25/2019, 04/06/2021   Influenza Split 04/03/2011, 04/08/2012   Influenza Whole 04/07/2007, 03/31/2008, 03/22/2009, 03/27/2010   Influenza, High Dose Seasonal PF 03/25/2013, 03/23/2014, 03/14/2017, 03/20/2018, 03/14/2020   Influenza-Unspecified 03/14/2015, 02/08/2016, 03/14/2017   PFIZER(Purple Top)SARS-COV-2 Vaccination 07/07/2019, 07/27/2019, 03/22/2020   Pfizer Covid-19 Vaccine Bivalent Booster 22yr & up 06/05/2021   Pneumococcal Conjugate-13 01/23/2015   Pneumococcal Polysaccharide-23 10/05/2009   Tdap 04/26/2004  Tetanus 07/01/2014   Zoster, Live 06/25/2011    TDAP status: Up to date  Flu Vaccine status: Completed at today's visit  Pneumococcal vaccine status: Up to date  Covid-19 vaccine status: Completed  vaccines  Qualifies for Shingles Vaccine? Yes   Zostavax completed Yes   Shingrix Completed?: No.    Education has been provided regarding the importance of this vaccine. Patient has been advised to call insurance company to determine out of pocket expense if they have not yet received this vaccine. Advised may also receive vaccine at local pharmacy or Health Dept. Verbalized acceptance and understanding.  Screening Tests Health Maintenance  Topic Date Due   FOOT EXAM  Never done   OPHTHALMOLOGY EXAM  Never done   Zoster Vaccines- Shingrix (1 of 2) Never done   COVID-19 Vaccine (5 - Pfizer series) 10/04/2021   INFLUENZA VACCINE  01/22/2022   Diabetic kidney evaluation - Urine ACR  08/14/2022   HEMOGLOBIN A1C  08/23/2022   Diabetic kidney evaluation - GFR measurement  02/23/2023   TETANUS/TDAP  07/01/2024   Pneumonia Vaccine 33+ Years old  Completed   HPV VACCINES  Aged Out    Health Maintenance  Health Maintenance Due  Topic Date Due   FOOT EXAM  Never done   OPHTHALMOLOGY EXAM  Never done   Zoster Vaccines- Shingrix (1 of 2) Never done   COVID-19 Vaccine (5 - Pfizer series) 10/04/2021   INFLUENZA VACCINE  01/22/2022    Colorectal cancer screening: No longer required.   Lung Cancer Screening: (Low Dose CT Chest recommended if Age 41-80 years, 30 pack-year currently smoking OR have quit w/in 15years.) does not qualify.   Lung Cancer Screening Referral: no  Additional Screening:  Hepatitis C Screening: does not qualify; Completed no  Vision Screening: Recommended annual ophthalmology exams for early detection of glaucoma and other disorders of the eye. Is the patient up to date with their annual eye exam?  Yes  Who is the provider or what is the name of the office in which the patient attends annual eye exams? Marica Otter, OD. If pt is not established with a provider, would they like to be referred to a provider to establish care? No .   Dental Screening: Recommended  annual dental exams for proper oral hygiene  Community Resource Referral / Chronic Care Management: CRR required this visit?  No   CCM required this visit?  No      Plan:     I have personally reviewed and noted the following in the patient's chart:   Medical and social history Use of alcohol, tobacco or illicit drugs  Current medications and supplements including opioid prescriptions. Patient is not currently taking opioid prescriptions. Functional ability and status Nutritional status Physical activity Advanced directives List of other physicians Hospitalizations, surgeries, and ER visits in previous 12 months Vitals Screenings to include cognitive, depression, and falls Referrals and appointments  In addition, I have reviewed and discussed with patient certain preventive protocols, quality metrics, and best practice recommendations. A written personalized care plan for preventive services as well as general preventive health recommendations were provided to patient.     Sheral Flow, LPN   03/01/3381   Nurse Notes:  Normal cognitive status assessed by direct observation by this Nurse Health Advisor. No abnormalities found.

## 2022-02-28 NOTE — Assessment & Plan Note (Signed)
New Experiencing hoarseness ? Chronic sinusitis vs Reflux Denies PND and reflux Advised claritin daily or pantoprazole 40 mg daily -- will try claritin x at least 3 weeks to see if that helps -- if no improvement should re-try pantoprazole 40 mg for possible silent GERD

## 2022-03-11 ENCOUNTER — Other Ambulatory Visit (HOSPITAL_BASED_OUTPATIENT_CLINIC_OR_DEPARTMENT_OTHER): Payer: Self-pay

## 2022-03-11 MED ORDER — ZOSTER VAC RECOMB ADJUVANTED 50 MCG/0.5ML IM SUSR
INTRAMUSCULAR | 1 refills | Status: DC
  Filled 2022-03-11: qty 0.5, 1d supply, fill #0
  Filled 2022-08-09: qty 0.5, 1d supply, fill #1

## 2022-05-20 ENCOUNTER — Encounter: Payer: Self-pay | Admitting: Internal Medicine

## 2022-05-29 ENCOUNTER — Other Ambulatory Visit (HOSPITAL_BASED_OUTPATIENT_CLINIC_OR_DEPARTMENT_OTHER): Payer: Self-pay

## 2022-05-29 DIAGNOSIS — Z23 Encounter for immunization: Secondary | ICD-10-CM | POA: Diagnosis not present

## 2022-05-29 MED ORDER — COMIRNATY 30 MCG/0.3ML IM SUSY
PREFILLED_SYRINGE | INTRAMUSCULAR | 0 refills | Status: DC
  Filled 2022-05-29: qty 0.3, 1d supply, fill #0

## 2022-06-20 DIAGNOSIS — H31001 Unspecified chorioretinal scars, right eye: Secondary | ICD-10-CM | POA: Diagnosis not present

## 2022-06-20 DIAGNOSIS — H5203 Hypermetropia, bilateral: Secondary | ICD-10-CM | POA: Diagnosis not present

## 2022-06-20 DIAGNOSIS — H53143 Visual discomfort, bilateral: Secondary | ICD-10-CM | POA: Diagnosis not present

## 2022-06-20 DIAGNOSIS — H52223 Regular astigmatism, bilateral: Secondary | ICD-10-CM | POA: Diagnosis not present

## 2022-06-20 DIAGNOSIS — H50022 Monocular esotropia with A pattern, left eye: Secondary | ICD-10-CM | POA: Diagnosis not present

## 2022-06-20 DIAGNOSIS — H5053 Vertical heterophoria: Secondary | ICD-10-CM | POA: Diagnosis not present

## 2022-06-20 DIAGNOSIS — H50021 Monocular esotropia with A pattern, right eye: Secondary | ICD-10-CM | POA: Diagnosis not present

## 2022-06-20 DIAGNOSIS — H524 Presbyopia: Secondary | ICD-10-CM | POA: Diagnosis not present

## 2022-06-20 DIAGNOSIS — H50112 Monocular exotropia, left eye: Secondary | ICD-10-CM | POA: Diagnosis not present

## 2022-06-26 ENCOUNTER — Other Ambulatory Visit (HOSPITAL_BASED_OUTPATIENT_CLINIC_OR_DEPARTMENT_OTHER): Payer: Self-pay

## 2022-06-26 MED ORDER — AREXVY 120 MCG/0.5ML IM SUSR
INTRAMUSCULAR | 0 refills | Status: DC
  Filled 2022-06-26: qty 0.5, 1d supply, fill #0

## 2022-07-07 ENCOUNTER — Other Ambulatory Visit: Payer: Self-pay | Admitting: Internal Medicine

## 2022-07-08 ENCOUNTER — Other Ambulatory Visit: Payer: Self-pay

## 2022-07-08 DIAGNOSIS — L400 Psoriasis vulgaris: Secondary | ICD-10-CM | POA: Diagnosis not present

## 2022-07-08 DIAGNOSIS — D485 Neoplasm of uncertain behavior of skin: Secondary | ICD-10-CM | POA: Diagnosis not present

## 2022-07-08 DIAGNOSIS — L570 Actinic keratosis: Secondary | ICD-10-CM | POA: Diagnosis not present

## 2022-07-08 DIAGNOSIS — L821 Other seborrheic keratosis: Secondary | ICD-10-CM | POA: Diagnosis not present

## 2022-07-08 DIAGNOSIS — D1801 Hemangioma of skin and subcutaneous tissue: Secondary | ICD-10-CM | POA: Diagnosis not present

## 2022-07-29 ENCOUNTER — Other Ambulatory Visit: Payer: Self-pay | Admitting: Internal Medicine

## 2022-07-29 ENCOUNTER — Other Ambulatory Visit: Payer: Self-pay

## 2022-08-09 ENCOUNTER — Other Ambulatory Visit (HOSPITAL_BASED_OUTPATIENT_CLINIC_OR_DEPARTMENT_OTHER): Payer: Self-pay

## 2022-08-23 ENCOUNTER — Other Ambulatory Visit (INDEPENDENT_AMBULATORY_CARE_PROVIDER_SITE_OTHER): Payer: Medicare Other

## 2022-08-23 DIAGNOSIS — N1831 Chronic kidney disease, stage 3a: Secondary | ICD-10-CM | POA: Diagnosis not present

## 2022-08-23 DIAGNOSIS — E782 Mixed hyperlipidemia: Secondary | ICD-10-CM

## 2022-08-23 DIAGNOSIS — D649 Anemia, unspecified: Secondary | ICD-10-CM

## 2022-08-23 DIAGNOSIS — E119 Type 2 diabetes mellitus without complications: Secondary | ICD-10-CM | POA: Diagnosis not present

## 2022-08-23 LAB — COMPREHENSIVE METABOLIC PANEL
ALT: 15 U/L (ref 0–53)
AST: 23 U/L (ref 0–37)
Albumin: 3.8 g/dL (ref 3.5–5.2)
Alkaline Phosphatase: 35 U/L — ABNORMAL LOW (ref 39–117)
BUN: 18 mg/dL (ref 6–23)
CO2: 29 mEq/L (ref 19–32)
Calcium: 9.3 mg/dL (ref 8.4–10.5)
Chloride: 106 mEq/L (ref 96–112)
Creatinine, Ser: 1.29 mg/dL (ref 0.40–1.50)
GFR: 51.04 mL/min — ABNORMAL LOW (ref >60.00)
Glucose, Bld: 130 mg/dL — ABNORMAL HIGH (ref 70–99)
Potassium: 4.2 mEq/L (ref 3.5–5.1)
Sodium: 141 mEq/L (ref 135–145)
Total Bilirubin: 1.1 mg/dL (ref 0.2–1.2)
Total Protein: 6.8 g/dL (ref 6.0–8.3)

## 2022-08-23 LAB — MICROALBUMIN / CREATININE URINE RATIO
Creatinine,U: 180.9 mg/dL
Microalb Creat Ratio: 0.6 mg/g (ref 0.0–30.0)
Microalb, Ur: 1.1 mg/dL (ref 0.0–1.9)

## 2022-08-23 LAB — CBC WITH DIFFERENTIAL/PLATELET
Basophils Absolute: 0 10*3/uL (ref 0.0–0.1)
Basophils Relative: 0.6 % (ref 0.0–3.0)
Eosinophils Absolute: 0.4 10*3/uL (ref 0.0–0.7)
Eosinophils Relative: 5.7 % — ABNORMAL HIGH (ref 0.0–5.0)
HCT: 41.6 % (ref 39.0–52.0)
Hemoglobin: 14.5 g/dL (ref 13.0–17.0)
Lymphocytes Relative: 19 % (ref 12.0–46.0)
Lymphs Abs: 1.4 10*3/uL (ref 0.7–4.0)
MCHC: 35 g/dL (ref 30.0–36.0)
MCV: 100.7 fl — ABNORMAL HIGH (ref 78.0–100.0)
Monocytes Absolute: 0.8 10*3/uL (ref 0.1–1.0)
Monocytes Relative: 11.7 % (ref 3.0–12.0)
Neutro Abs: 4.5 10*3/uL (ref 1.4–7.7)
Neutrophils Relative %: 63 % (ref 43.0–77.0)
Platelets: 212 10*3/uL (ref 150.0–400.0)
RBC: 4.13 Mil/uL — ABNORMAL LOW (ref 4.22–5.81)
RDW: 13.1 % (ref 11.5–15.5)
WBC: 7.1 10*3/uL (ref 4.0–10.5)

## 2022-08-23 LAB — LIPID PANEL
Cholesterol: 88 mg/dL (ref 0–200)
HDL: 43.5 mg/dL (ref >39.00)
LDL Cholesterol: 33 mg/dL (ref 0–99)
NonHDL: 44.41
Total CHOL/HDL Ratio: 2
Triglycerides: 56 mg/dL (ref 0.0–149.0)
VLDL: 11.2 mg/dL (ref 0.0–40.0)

## 2022-08-23 LAB — HEMOGLOBIN A1C: Hgb A1c MFr Bld: 6.5 % (ref 4.6–6.5)

## 2022-08-29 ENCOUNTER — Telehealth: Payer: Self-pay | Admitting: Internal Medicine

## 2022-08-29 ENCOUNTER — Encounter: Payer: Self-pay | Admitting: Internal Medicine

## 2022-08-29 ENCOUNTER — Ambulatory Visit (INDEPENDENT_AMBULATORY_CARE_PROVIDER_SITE_OTHER): Payer: Medicare Other | Admitting: Internal Medicine

## 2022-08-29 VITALS — BP 130/74 | HR 90 | Temp 98.0°F | Ht 71.0 in | Wt 166.0 lb

## 2022-08-29 DIAGNOSIS — R490 Dysphonia: Secondary | ICD-10-CM | POA: Diagnosis not present

## 2022-08-29 DIAGNOSIS — E782 Mixed hyperlipidemia: Secondary | ICD-10-CM | POA: Diagnosis not present

## 2022-08-29 DIAGNOSIS — E119 Type 2 diabetes mellitus without complications: Secondary | ICD-10-CM

## 2022-08-29 DIAGNOSIS — I700 Atherosclerosis of aorta: Secondary | ICD-10-CM

## 2022-08-29 DIAGNOSIS — N1831 Chronic kidney disease, stage 3a: Secondary | ICD-10-CM | POA: Diagnosis not present

## 2022-08-29 DIAGNOSIS — D649 Anemia, unspecified: Secondary | ICD-10-CM | POA: Diagnosis not present

## 2022-08-29 DIAGNOSIS — K219 Gastro-esophageal reflux disease without esophagitis: Secondary | ICD-10-CM | POA: Diagnosis not present

## 2022-08-29 NOTE — Assessment & Plan Note (Signed)
Chronic Kidney function is stable and mild No change in medication.

## 2022-08-29 NOTE — Progress Notes (Signed)
Subjective:    Patient ID: Walter Hess, male    DOB: Nov 21, 1940, 82 y.o.   MRN: [id number]     HPI Walter Hess is here for follow up of his chronic medical problems.  He had blood work done last week  He is exercising regularly  He feels he is doing well.    Medications and allergies reviewed with patient and updated if appropriate.  Current Outpatient Medications on File Prior to Visit  Medication Sig Dispense Refill   aspirin 81 MG tablet Take 81 mg by mouth daily.     betamethasone dipropionate 0.05 % cream Apply topically 2 (two) times daily as needed.     Cinnamon 500 MG TABS Take by mouth.     Coenzyme Q10 (COQ10) 100 MG CAPS Take by mouth daily.     cyanocobalamin (VITAMIN B12) 500 MCG tablet Take 500 mcg by mouth daily.     famotidine (PEPCID) 40 MG tablet TAKE 1 TABLET BY MOUTH EVERY DAY 90 tablet 1   Multiple Vitamin (MULTIVITAMIN) tablet Take 1 tablet by mouth daily.     rosuvastatin (CRESTOR) 5 MG tablet TAKE 1 TABLET BY MOUTH EVERY DAY 90 tablet 1   S-Adenosylmethionine (SAM-E) 200 MG TABS Take by mouth daily.     Saw Palmetto 450 MG CAPS Take by mouth daily.     Turmeric 500 MG CAPS Take by mouth daily.     No current facility-administered medications on file prior to visit.     Review of Systems  Constitutional:  Negative for fever.  Respiratory:  Negative for cough, shortness of breath and wheezing.   Cardiovascular:  Negative for chest pain, palpitations and leg swelling.  Neurological:  Negative for light-headedness and headaches.       Objective:   Vitals:   08/29/22 0921  BP: 130/74  Pulse: 90  Temp: 98 F (36.7 C)  SpO2: 95%   BP Readings from Last 3 Encounters:  08/29/22 130/74  02/28/22 122/62  02/28/22 122/62   Wt Readings from Last 3 Encounters:  08/29/22 166 lb (75.3 kg)  02/28/22 159 lb 6.4 oz (72.3 kg)  02/28/22 159 lb 6.4 oz (72.3 kg)   Body mass index is 23.15 kg/m.    Physical Exam Constitutional:       General: He is not in acute distress.    Appearance: Normal appearance. He is not ill-appearing.  HENT:     Head: Normocephalic and atraumatic.  Eyes:     Conjunctiva/sclera: Conjunctivae normal.  Cardiovascular:     Rate and Rhythm: Normal rate and regular rhythm.     Heart sounds: Normal heart sounds.  Pulmonary:     Effort: Pulmonary effort is normal. No respiratory distress.     Breath sounds: Rales (bibasilar dry rales) present. No wheezing.  Musculoskeletal:     Right lower leg: No edema.     Left lower leg: No edema.  Skin:    General: Skin is warm and dry.     Findings: No rash.  Neurological:     Mental Status: He is alert. Mental status is at baseline.  Psychiatric:        Mood and Affect: Mood normal.        Lab Results  Component Value Date   WBC 7.1 08/23/2022   HGB 14.5 08/23/2022   HCT 41.6 08/23/2022   PLT 212.0 08/23/2022   GLUCOSE 130 (H) 08/23/2022   CHOL 88 08/23/2022   TRIG 56.0 08/23/2022  HDL 43.50 08/23/2022   LDLCALC 33 08/23/2022   ALT 15 08/23/2022   AST 23 08/23/2022   NA 141 08/23/2022   K 4.2 08/23/2022   CL 106 08/23/2022   CREATININE 1.29 08/23/2022   BUN 18 08/23/2022   CO2 29 08/23/2022   TSH 4.20 01/27/2017   HGBA1C 6.5 08/23/2022   MICROALBUR 1.1 08/23/2022     Assessment & Plan:    See Problem List for Assessment and Plan of chronic medical problems.

## 2022-08-29 NOTE — Telephone Encounter (Signed)
Notified pt w/ MD response. Made appt for 02/26/23.Marland KitchenJohny Hess

## 2022-08-29 NOTE — Patient Instructions (Addendum)
      Blood work was ordered.   Had this done approximately 1 week prior to next appointment    Medications changes include :   None      Return in about 6 months (around 03/01/2023) for follow up.

## 2022-08-29 NOTE — Telephone Encounter (Signed)
Blood work was ordered at her visit-just needs an appointment

## 2022-08-29 NOTE — Assessment & Plan Note (Signed)
Improved with claritin

## 2022-08-29 NOTE — Assessment & Plan Note (Signed)
Chronic Denies GERD-never really had reflux, but had cough and decreased lung function Continue famotidine 40 mg daily

## 2022-08-29 NOTE — Assessment & Plan Note (Addendum)
Chronic No anemia with most recent blood work MCV slightly elevated - he has been taking a B12 daily - continue Continue multivitamin Will continue to monitor

## 2022-08-29 NOTE — Assessment & Plan Note (Signed)
Chronic LDL well controlled Continue Crestor 5 mg daily Continue healthy diet, regular exercise

## 2022-08-29 NOTE — Telephone Encounter (Signed)
Patient would like to have lab work done prior to 03/03/23 appointment. Please call back at [phone] to schedule lab work.

## 2022-08-29 NOTE — Telephone Encounter (Signed)
Pt has medicare.Marland KitchenJohny Hess

## 2022-08-29 NOTE — Assessment & Plan Note (Signed)
Chronic Lab Results  Component Value Date   LDLCALC 33 08/23/2022   Cholesterol very well-controlled Continue Crestor 5 mg daily

## 2022-08-29 NOTE — Assessment & Plan Note (Addendum)
Chronic stable  Lab Results  Component Value Date   HGBA1C 6.5 08/23/2022   Sugars well controlled Testing sugars 1 times a day Continue lifestyle control Stressed regular exercise, diabetic diet

## 2022-11-10 ENCOUNTER — Other Ambulatory Visit: Payer: Self-pay | Admitting: Internal Medicine

## 2022-12-17 DIAGNOSIS — H903 Sensorineural hearing loss, bilateral: Secondary | ICD-10-CM | POA: Diagnosis not present

## 2022-12-17 DIAGNOSIS — H6123 Impacted cerumen, bilateral: Secondary | ICD-10-CM | POA: Diagnosis not present

## 2023-01-07 DIAGNOSIS — R21 Rash and other nonspecific skin eruption: Secondary | ICD-10-CM | POA: Diagnosis not present

## 2023-01-07 DIAGNOSIS — L570 Actinic keratosis: Secondary | ICD-10-CM | POA: Diagnosis not present

## 2023-01-07 DIAGNOSIS — D485 Neoplasm of uncertain behavior of skin: Secondary | ICD-10-CM | POA: Diagnosis not present

## 2023-01-07 DIAGNOSIS — L400 Psoriasis vulgaris: Secondary | ICD-10-CM | POA: Diagnosis not present

## 2023-01-07 DIAGNOSIS — L821 Other seborrheic keratosis: Secondary | ICD-10-CM | POA: Diagnosis not present

## 2023-02-05 ENCOUNTER — Ambulatory Visit: Payer: Medicare Other | Admitting: Internal Medicine

## 2023-02-05 ENCOUNTER — Encounter: Payer: Self-pay | Admitting: Internal Medicine

## 2023-02-05 VITALS — BP 132/84 | HR 98 | Temp 97.9°F | Ht 71.0 in | Wt 163.4 lb

## 2023-02-05 DIAGNOSIS — J8410 Pulmonary fibrosis, unspecified: Secondary | ICD-10-CM

## 2023-02-05 NOTE — Progress Notes (Signed)
Subjective:    Patient ID: Walter Hess, male   DOB: [birth year]     MRN: [id number]    Brief patient profile:  26 yowm never regular smoker stopped completely  in his 20's and no lower resp problems just sinus symptoms starting in 40s with recurrent infections  but no need for  specialty eval or need for rx then routine cxr suggested lung nodule in 01/2015 repeat in 01/2016 suggested PF so referred to pulmonary clinic 02/29/2016 by Dr  Lawerance Bach with dx of  PF not typical of UIP (non dx HRCT)     History of Present Illness  02/29/2016 1st [facility] office visit/    Chief Complaint  Patient presents with   Pulmonary Consult    Referred by Dr. Cheryll Cockayne for abnormal cxr. Pt denies any respiratory co's today.    6 days a week does 5 degrees of elevation at 3-4 mph x 25 min s sob x 40 year pattern s change rec Stay as active as you can and let me know if you are  losing ground with exercise or your oxygen saturation is dropping below your baseline   - above 90% is normal - pulse oximeter is the name of the instrument      02/07/2021  f/u ov/ re:  PF  UIP vs NSIP favor former  Chief Complaint  Patient presents with   Follow-up    Patient denies any concerns.   Dyspnea:  20 m s stopping with sats no lower than 90% Cough: none  Sleeping: fine flat SABA use: none  02: none  Covid status:   vax x 3  ENT Jenne Pane but has not seen re hoarseness  Rec Make sure you check your oxygen saturation at your highest level of activity to be sure it stays over 90%  Had covid 02/2021    08/20/2021  f/u ov/ re: UIP vs NSIP   maint on pepcid 40 mg one daily  per Dr Lawerance Bach  Chief Complaint  Patient presents with   Follow-up    Doing well  Dyspnea:  treadmill 6 days per week at 4 degrees elevation x 22-25 min at 3-3.5 p  Cough: one and done > mucus  Sleeping: fine 4 in bed blocks  SABA use: none  02: none  Rec No change in recommendations - let me know if losing ground with your  peak 02 saturation during exercise.  Please schedule a follow up visit in 6  months but call sooner if needed > moved and lost track of 02 sats with ex   02/05/2023  f/u ov/ re: UIP vs NSIP   maint on pepcid 40 mg pc q am  worried about kidney function so avoids PPI  Chief Complaint  Patient presents with   Follow-up    SaO2 in mid 80's when working out x 3 months.  Dyspnea: no longer doing same workout,  hills are a problem  on his walks and desats mid 80s but feels "better than ever" with new more intense ex program. Cough: none  Sleeping: 4 in bed blocks  SABA use: none  02: none     No obvious day to day or daytime variability or assoc excess/ purulent sputum or mucus plugs or hemoptysis or cp or chest tightness, subjective wheeze or overt sinus or hb symptoms.    Also denies any obvious fluctuation of symptoms with weather or environmental changes or other aggravating or alleviating factors except as outlined  above   No unusual exposure hx or h/o childhood pna/ asthma or knowledge of premature birth.  Current Allergies, Complete Past Medical History, Past Surgical History, Family History, and Social History were reviewed in [employer] record.  ROS  The following are not active complaints unless bolded Hoarseness, sore throat, dysphagia, dental problems, itching, sneezing,  nasal congestion or discharge of excess mucus or purulent secretions, ear ache,   fever, chills, sweats, unintended wt loss or wt gain, classically pleuritic or exertional cp,  orthopnea pnd or arm/hand swelling  or leg swelling, presyncope, palpitations, abdominal pain, anorexia, nausea, vomiting, diarrhea  or change in bowel habits or change in bladder habits, change in stools or change in urine, dysuria, hematuria,  rash, arthralgias, visual complaints, headache, numbness, weakness or ataxia or problems with walking or coordination,  change in mood or  memory.        Current Meds   Medication Sig   aspirin 81 MG tablet Take 81 mg by mouth daily.   betamethasone dipropionate 0.05 % cream Apply topically 2 (two) times daily as needed.   Cinnamon 500 MG TABS Take by mouth.   Coenzyme Q10 (COQ10) 100 MG CAPS Take by mouth daily.   cyanocobalamin (VITAMIN B12) 500 MCG tablet Take 500 mcg by mouth daily.   famotidine (PEPCID) 40 MG tablet TAKE 1 TABLET BY MOUTH EVERY DAY   loratadine (CLARITIN) 10 MG tablet Take 10 mg by mouth daily.   Multiple Vitamin (MULTIVITAMIN) tablet Take 1 tablet by mouth daily.   rosuvastatin (CRESTOR) 5 MG tablet TAKE 1 TABLET BY MOUTH EVERY DAY   S-Adenosylmethionine (SAM-E) 200 MG TABS Take by mouth daily.   Saw Palmetto 450 MG CAPS Take by mouth daily.   Turmeric 500 MG CAPS Take by mouth daily.         .                   Objective:   Physical Exam  wts  02/05/2023        163  08/20/2021        158   02/07/2021        160  08/07/2020        161   01/27/2020           162  12/11/2018        160 11/25/2017          165  05/27/2017        166  11/25/2016          167  06/27/2016          163 05/30/2016        168   02/29/16 169 lb 12.8 oz (77 kg)  01/25/16 160 lb 8 oz (72.8 kg)  01/23/15 170 lb (77.1 kg)    Vital signs reviewed  02/05/2023  - Note at rest 02 sats  97% on RA   General appearance:    robust amb wm nad     HEENT : Oropharynx  clear         NECK :  without  apparent JVD/ palpable Nodes/TM    LUNGS: no acc muscle use,  Nl contour chest with insp crackles both bases slt more prominent on R and no assoc cough   CV:  RRR  no s3 or murmur or increase in P2, and no edema   ABD:  soft and nontender     MS:  Nl gait/ ext  warm without deformities Or obvious joint restrictions  calf tenderness, cyanosis or clubbing    SKIN: warm and dry without lesions    NEURO:  alert, approp, nl sensorium with  no motor or cerebellar deficits apparent.       Assessment:

## 2023-02-05 NOTE — Assessment & Plan Note (Signed)
Incidental finding/ pt asymptomatic Ct scan 01/2016:  No suspicious pulmonary nodules. Changes of pulmonary fibrosis, most pronounced in the mid and lower lung zones. - PFT's  05/30/2016  FEV1 3.07 (101 % ) ratio 81  p 2 % improvement from saba p nothing  prior to study with DLCO  54/59 % corrects to 76 % for alv volume   - HRCT  01/24/2017 1. There is a spectrum of findings compatible with interstitial lung disease, an although the overall appearance is considered a "probable UIP CT pattern", the lack of progression compared to the prior study, lack of frank honeycombing and presence of air trapping raises the possibility of fibrotic phase nonspecific interstitial pneumonia (NSIP).  - PFT's  11/25/2017   FVC 3.80 (91%) with DLCO  57 % corrects to 78 % for alv volume  - PFT's  12/11/2018 FVC 3.75 (91%)   with DLCO  60 % corrects to 73  % for alv volume   -  01/27/2020   Walked RA  2 laps @ approx 279ft each @ fast pace  stopped due to end of study, no sob with sats 89% at end  - HRCT 02/14/2020   Minimal change UIP pattern  -  08/07/2020   Walked RA  2 laps @ approx 264ft each @ fast pace  stopped due to end of study,  no sob with sats  93% at end  - 02/07/2021   Walked on RA x  2  lap(s) =  approx 500 ft @ nl pace, stopped due to end of study  with lowest 02 sats 91% at end   - 08/20/2021 reports no change ex tol on treadmill at home x 40 y and no change in peak 02 sats never below 91% and declined PF clinic . Off ppi  And on H2 prophylactically  - 02/05/2023   Walked on RA  x  3  lap(s) =  approx 750  ft  @ brisk pace, stopped due to end of study with lowest 02 sats 90%   - HRCT chest 02/05/2023 >>>   I doubt he's really had much progression clinically as he is doing a new more intense regimen than prior treadmill walking but needs radiographic re-assess to complete the work up and in the meantime rec   Make sure you check your oxygen saturation at your highest level of activity (NOT after you stop)  to be  sure it stays over 90% and keep track of it at least once a week, more often if breathing getting worse, and let me know if losing ground. (Collect the dots to connect the dots approach)    F/u q 3 m with option to refer to PF clinic if progressing   Each maintenance medication was reviewed in detail including emphasizing most importantly the difference between maintenance and prns and under what circumstances the prns are to be triggered using an action plan format where appropriate.  Total time for H and P, chart review, counseling,  directly observing portions of ambulatory 02 saturation study/ and generating customized AVS unique to this office visit / same day charting > 30 min

## 2023-02-05 NOTE — Patient Instructions (Addendum)
Resume treadmill at previous pace,duration, tilt and let me know if 02 saturations trending down   My office will be contacting you by phone for referral to HRCT   - if you don't hear back from my office within one week please call us back or notify us thru MyChart and we'll address it right away.    Please schedule a follow up visit in 3 months but call sooner if needed

## 2023-02-06 ENCOUNTER — Encounter (INDEPENDENT_AMBULATORY_CARE_PROVIDER_SITE_OTHER): Payer: Self-pay

## 2023-02-06 ENCOUNTER — Ambulatory Visit: Payer: Medicare Other | Admitting: Internal Medicine

## 2023-02-08 ENCOUNTER — Encounter: Payer: Self-pay | Admitting: Internal Medicine

## 2023-02-13 ENCOUNTER — Encounter (HOSPITAL_BASED_OUTPATIENT_CLINIC_OR_DEPARTMENT_OTHER): Payer: Self-pay

## 2023-02-13 ENCOUNTER — Ambulatory Visit (HOSPITAL_BASED_OUTPATIENT_CLINIC_OR_DEPARTMENT_OTHER)
Admission: RE | Admit: 2023-02-13 | Discharge: 2023-02-13 | Disposition: A | Discharge status: Home / Self Care | Payer: Medicare Other | Source: Ambulatory Visit | Attending: Internal Medicine | Admitting: Internal Medicine

## 2023-02-13 DIAGNOSIS — J8410 Pulmonary fibrosis, unspecified: Secondary | ICD-10-CM | POA: Diagnosis not present

## 2023-02-17 DIAGNOSIS — H903 Sensorineural hearing loss, bilateral: Secondary | ICD-10-CM | POA: Diagnosis not present

## 2023-02-26 ENCOUNTER — Other Ambulatory Visit (INDEPENDENT_AMBULATORY_CARE_PROVIDER_SITE_OTHER): Payer: Medicare Other

## 2023-02-26 DIAGNOSIS — N1831 Chronic kidney disease, stage 3a: Secondary | ICD-10-CM

## 2023-02-26 DIAGNOSIS — E782 Mixed hyperlipidemia: Secondary | ICD-10-CM

## 2023-02-26 DIAGNOSIS — D649 Anemia, unspecified: Secondary | ICD-10-CM

## 2023-02-26 DIAGNOSIS — E119 Type 2 diabetes mellitus without complications: Secondary | ICD-10-CM | POA: Diagnosis not present

## 2023-02-26 LAB — COMPREHENSIVE METABOLIC PANEL
ALT: 13 U/L (ref 0–53)
AST: 18 U/L (ref 0–37)
Albumin: 3.7 g/dL (ref 3.5–5.2)
Alkaline Phosphatase: 35 U/L — ABNORMAL LOW (ref 39–117)
BUN: 14 mg/dL (ref 6–23)
CO2: 26 meq/L (ref 19–32)
Calcium: 8.9 mg/dL (ref 8.4–10.5)
Chloride: 107 meq/L (ref 96–112)
Creatinine, Ser: 1.22 mg/dL (ref 0.40–1.50)
GFR: 54.38 mL/min — ABNORMAL LOW (ref >60.00)
Glucose, Bld: 139 mg/dL — ABNORMAL HIGH (ref 70–99)
Potassium: 3.9 meq/L (ref 3.5–5.1)
Sodium: 139 meq/L (ref 135–145)
Total Bilirubin: 1.1 mg/dL (ref 0.2–1.2)
Total Protein: 6.6 g/dL (ref 6.0–8.3)

## 2023-02-26 LAB — CBC WITH DIFFERENTIAL/PLATELET
Basophils Absolute: 0 10*3/uL (ref 0.0–0.1)
Basophils Relative: 0.6 % (ref 0.0–3.0)
Eosinophils Absolute: 0.4 10*3/uL (ref 0.0–0.7)
Eosinophils Relative: 5.1 % — ABNORMAL HIGH (ref 0.0–5.0)
HCT: 39.3 % (ref 39.0–52.0)
Hemoglobin: 13.7 g/dL (ref 13.0–17.0)
Lymphocytes Relative: 15.7 % (ref 12.0–46.0)
Lymphs Abs: 1.1 10*3/uL (ref 0.7–4.0)
MCHC: 34.8 g/dL (ref 30.0–36.0)
MCV: 101 fl — ABNORMAL HIGH (ref 78.0–100.0)
Monocytes Absolute: 0.9 10*3/uL (ref 0.1–1.0)
Monocytes Relative: 12.1 % — ABNORMAL HIGH (ref 3.0–12.0)
Neutro Abs: 4.7 10*3/uL (ref 1.4–7.7)
Neutrophils Relative %: 66.5 % (ref 43.0–77.0)
Platelets: 198 10*3/uL (ref 150.0–400.0)
RBC: 3.9 Mil/uL — ABNORMAL LOW (ref 4.22–5.81)
RDW: 12.1 % (ref 11.5–15.5)
WBC: 7 10*3/uL (ref 4.0–10.5)

## 2023-02-26 LAB — LIPID PANEL
Cholesterol: 91 mg/dL (ref 0–200)
HDL: 39.1 mg/dL (ref >39.00)
LDL Cholesterol: 41 mg/dL (ref 0–99)
NonHDL: 51.87
Total CHOL/HDL Ratio: 2
Triglycerides: 56 mg/dL (ref 0.0–149.0)
VLDL: 11.2 mg/dL (ref 0.0–40.0)

## 2023-02-26 LAB — HEMOGLOBIN A1C: Hgb A1c MFr Bld: 6.5 % (ref 4.6–6.5)

## 2023-02-28 ENCOUNTER — Other Ambulatory Visit (HOSPITAL_BASED_OUTPATIENT_CLINIC_OR_DEPARTMENT_OTHER): Payer: Self-pay

## 2023-02-28 DIAGNOSIS — Z23 Encounter for immunization: Secondary | ICD-10-CM | POA: Diagnosis not present

## 2023-02-28 MED ORDER — FLUAD 0.5 ML IM SUSY
0.5000 mL | PREFILLED_SYRINGE | Freq: Once | INTRAMUSCULAR | 0 refills | Status: AC
  Filled 2023-02-28: qty 0.5, 1d supply, fill #0

## 2023-03-02 NOTE — Progress Notes (Unsigned)
Subjective:    Patient ID: Walter Hess, male    DOB: Dec 15, 1940, 82 y.o.   MRN: [id number]     HPI Walter Hess is here for follow up of his chronic medical problems.  Pulm fibrosis has gotten a little worse - oxygen dropping with strenuous exercise.    He is walking regularly.    Medications and allergies reviewed with patient and updated if appropriate.  Current Outpatient Medications on File Prior to Visit  Medication Sig Dispense Refill   aspirin 81 MG tablet Take 81 mg by mouth daily.     betamethasone dipropionate 0.05 % cream Apply topically 2 (two) times daily as needed.     Cinnamon 500 MG TABS Take by mouth.     Coenzyme Q10 (COQ10) 100 MG CAPS Take by mouth daily.     cyanocobalamin (VITAMIN B12) 500 MCG tablet Take 500 mcg by mouth daily.     famotidine (PEPCID) 40 MG tablet TAKE 1 TABLET BY MOUTH EVERY DAY 90 tablet 1   loratadine (CLARITIN) 10 MG tablet Take 10 mg by mouth daily.     Multiple Vitamin (MULTIVITAMIN) tablet Take 1 tablet by mouth daily.     rosuvastatin (CRESTOR) 5 MG tablet TAKE 1 TABLET BY MOUTH EVERY DAY 90 tablet 1   S-Adenosylmethionine (SAM-E) 200 MG TABS Take by mouth daily.     Saw Palmetto 450 MG CAPS Take by mouth daily.     Turmeric 500 MG CAPS Take by mouth daily.     No current facility-administered medications on file prior to visit.     Review of Systems  Constitutional:  Negative for fever.  Respiratory:  Negative for cough, shortness of breath and wheezing.   Cardiovascular:  Negative for chest pain, palpitations and leg swelling.  Neurological:  Negative for light-headedness and headaches.       Objective:   Vitals:   03/03/23 0853  Pulse: 79  Temp: 98.2 F (36.8 C)  SpO2: 96%   BP Readings from Last 3 Encounters:  02/05/23 132/84  08/29/22 130/74  02/28/22 122/62   Wt Readings from Last 3 Encounters:  03/03/23 164 lb (74.4 kg)  02/05/23 163 lb 6.4 oz (74.1 kg)  08/29/22 166 lb (75.3 kg)   Body mass  index is 22.87 kg/m.    Physical Exam Constitutional:      General: He is not in acute distress.    Appearance: Normal appearance. He is not ill-appearing.  HENT:     Head: Normocephalic and atraumatic.  Eyes:     Conjunctiva/sclera: Conjunctivae normal.  Cardiovascular:     Rate and Rhythm: Normal rate and regular rhythm.     Heart sounds: Normal heart sounds.  Pulmonary:     Effort: Pulmonary effort is normal. No respiratory distress.     Breath sounds: Rales (lower 1/3 of bibasilar dry crackles c/w fibrosis) present. No wheezing.  Musculoskeletal:     Right lower leg: No edema.     Left lower leg: No edema.  Skin:    General: Skin is warm and dry.     Findings: No rash.  Neurological:     Mental Status: He is alert. Mental status is at baseline.  Psychiatric:        Mood and Affect: Mood normal.        Lab Results  Component Value Date   WBC 7.0 02/26/2023   HGB 13.7 02/26/2023   HCT 39.3 02/26/2023   PLT 198.0 02/26/2023  GLUCOSE 139 (H) 02/26/2023   CHOL 91 02/26/2023   TRIG 56.0 02/26/2023   HDL 39.10 02/26/2023   LDLCALC 41 02/26/2023   ALT 13 02/26/2023   AST 18 02/26/2023   NA 139 02/26/2023   K 3.9 02/26/2023   CL 107 02/26/2023   CREATININE 1.22 02/26/2023   BUN 14 02/26/2023   CO2 26 02/26/2023   TSH 4.20 01/27/2017   HGBA1C 6.5 02/26/2023   MICROALBUR 1.1 08/23/2022     Assessment & Plan:    See Problem List for Assessment and Plan of chronic medical problems.

## 2023-03-02 NOTE — Patient Instructions (Addendum)
      Blood work was ordered.   The lab is on the first floor.    Medications changes include :   none     Return in about 6 months (around 08/31/2023) for follow up and wellness visit with nurse same day.  lab appt prior.

## 2023-03-03 ENCOUNTER — Encounter: Payer: Self-pay | Admitting: Internal Medicine

## 2023-03-03 ENCOUNTER — Ambulatory Visit (INDEPENDENT_AMBULATORY_CARE_PROVIDER_SITE_OTHER): Payer: Medicare Other | Admitting: Internal Medicine

## 2023-03-03 VITALS — BP 134/80 | HR 79 | Temp 98.2°F | Ht 71.0 in | Wt 164.0 lb

## 2023-03-03 DIAGNOSIS — D649 Anemia, unspecified: Secondary | ICD-10-CM | POA: Diagnosis not present

## 2023-03-03 DIAGNOSIS — I700 Atherosclerosis of aorta: Secondary | ICD-10-CM | POA: Diagnosis not present

## 2023-03-03 DIAGNOSIS — K219 Gastro-esophageal reflux disease without esophagitis: Secondary | ICD-10-CM

## 2023-03-03 DIAGNOSIS — E782 Mixed hyperlipidemia: Secondary | ICD-10-CM | POA: Diagnosis not present

## 2023-03-03 DIAGNOSIS — E1169 Type 2 diabetes mellitus with other specified complication: Secondary | ICD-10-CM | POA: Diagnosis not present

## 2023-03-03 DIAGNOSIS — N1831 Chronic kidney disease, stage 3a: Secondary | ICD-10-CM

## 2023-03-03 NOTE — Assessment & Plan Note (Signed)
Chronic With hyperlipidemia Stable  Lab Results  Component Value Date   HGBA1C 6.5 02/26/2023   Sugars well controlled Testing sugars 1 times a day Continue lifestyle control Stressed regular exercise, diabetic diet

## 2023-03-03 NOTE — Assessment & Plan Note (Signed)
Chronic Kidney function is stable and mild No change in medication.

## 2023-03-03 NOTE — Assessment & Plan Note (Signed)
Chronic Lab Results  Component Value Date   LDLCALC 41 02/26/2023   Cholesterol very well-controlled Continue Crestor 5 mg daily

## 2023-03-03 NOTE — Assessment & Plan Note (Signed)
Chronic LDL well controlled Lab Results  Component Value Date   LDLCALC 41 02/26/2023   Continue Crestor 5 mg daily Continue healthy diet, regular exercise

## 2023-03-03 NOTE — Assessment & Plan Note (Signed)
History of anemia No anemia with most recent blood work MCV slightly elevated - he has been taking a B12 daily - continue Continue multivitamin Will continue to monitor

## 2023-03-03 NOTE — Assessment & Plan Note (Signed)
Chronic Denies GERD-never really had reflux, but had cough and decreased lung function Continue famotidine 40 mg daily

## 2023-04-07 ENCOUNTER — Other Ambulatory Visit (HOSPITAL_BASED_OUTPATIENT_CLINIC_OR_DEPARTMENT_OTHER): Payer: Self-pay

## 2023-04-07 DIAGNOSIS — Z23 Encounter for immunization: Secondary | ICD-10-CM | POA: Diagnosis not present

## 2023-04-07 MED ORDER — COMIRNATY 30 MCG/0.3ML IM SUSY
0.3000 mL | PREFILLED_SYRINGE | Freq: Once | INTRAMUSCULAR | 0 refills | Status: AC
  Filled 2023-04-07: qty 0.3, 1d supply, fill #0

## 2023-04-25 ENCOUNTER — Ambulatory Visit: Payer: Medicare Other | Admitting: Internal Medicine

## 2023-05-18 NOTE — Progress Notes (Unsigned)
Subjective:    Patient ID: Walter Hess, male   DOB: Jun 17, 1941     MRN: [id number]    Brief patient profile:  61 yowm never regular smoker stopped completely  in his 20's and no lower resp problems just sinus symptoms starting in 40s with recurrent infections  but no need for  specialty eval or need for rx then routine cxr suggested lung nodule in 01/2015 repeat in 01/2016 suggested PF so referred to pulmonary clinic 02/29/2016 by Dr  Lawerance Bach with dx of  PF not typical of UIP (non dx HRCT)     History of Present Illness  02/29/2016 1st [facility] office visit/ Phynix Horton   Chief Complaint  Patient presents with   Pulmonary Consult    Referred by Dr. Cheryll Cockayne for abnormal cxr. Pt denies any respiratory co's today.    6 days a week does 5 degrees of elevation at 3-4 mph x 25 min s sob x 40 year pattern s change rec Stay as active as you can and let me know if you are  losing ground with exercise or your oxygen saturation is dropping below your baseline   - above 90% is normal - pulse oximeter is the name of the instrument    Had covid 02/2021    08/20/2021  f/u ov/Juandavid Dallman re: UIP vs NSIP   maint on pepcid 40 mg one daily  per Dr Lawerance Bach  Chief Complaint  Patient presents with   Follow-up    Doing well  Dyspnea:  treadmill 6 days per week at 4 degrees elevation x 22-25 min at 3-3.5 p  Cough: one and done > mucus  Sleeping: fine 4 in bed blocks  SABA use: none  02: none  Rec No change in recommendations - let me know if losing ground with your peak 02 saturation during exercise.  Please schedule a follow up visit in 6  months but call sooner if needed > moved and lost track of 02 sats with ex   02/05/2023  f/u ov/Quintina Hakeem re: UIP vs NSIP   maint on pepcid 40 mg pc q am  worried about kidney function so avoids PPI  Chief Complaint  Patient presents with   Follow-up    SaO2 in mid 80's when working out x 3 months.  Dyspnea: no longer doing same workout,  hills are a problem  on his  walks and desats mid 80s but feels "better than ever" with new more intense ex program. Cough: none  Sleeping: 4 in bed blocks  SABA use: none  02: none  Rec Resume treadmill at previous pace, duration, tilt and let me know if 02 saturations trending down My office will be contacting you by phone for referral to HRCT   -- HRCT chest  02/13/23 slt progression dx UIP since 2021 > refer to PF clinic if willing     05/19/2023  3 m f/u ov/Cornie Herrington re: PF   maint on ? Pepcid   Chief Complaint  Patient presents with   Follow-up    Some cough in mornings, then doing well during day   Dyspnea:  30 min s stopping flat surface high 80s  Cough: minimal am mucus > slt light green x good while and seems to clear the cough but voice waxes and wanes  Sleeping: 4 in bed block s resp cc  SABA use: none  02: none      No obvious day to day or daytime variability or assoc  excess/ purulent sputum or mucus plugs or hemoptysis or cp or chest tightness, subjective wheeze or overt sinus or hb symptoms.    Also denies any obvious fluctuation of symptoms with weather or environmental changes or other aggravating or alleviating factors except as outlined above   No unusual exposure hx or h/o childhood pna/ asthma or knowledge of premature birth.  Current Allergies, Complete Past Medical History, Past Surgical History, Family History, and Social History were reviewed in [employer] record.  ROS  The following are not active complaints unless bolded Hoarseness, sore throat, dysphagia, dental problems, itching, sneezing,  nasal congestion or discharge of excess mucus or purulent secretions, ear ache,   fever, chills, sweats, unintended wt loss or wt gain, classically pleuritic or exertional cp,  orthopnea pnd or arm/hand swelling  or leg swelling, presyncope, palpitations, abdominal pain, anorexia, nausea, vomiting, diarrhea  or change in bowel habits or change in bladder habits, change in stools  or change in urine, dysuria, hematuria,  rash, arthralgias, visual complaints, headache, numbness, weakness or ataxia or problems with walking or coordination,  change in mood or  memory.        Current Meds  Medication Sig   aspirin 81 MG tablet Take 81 mg by mouth daily.   betamethasone dipropionate 0.05 % cream Apply topically 2 (two) times daily as needed.   Cinnamon 500 MG TABS Take by mouth.   Coenzyme Q10 (COQ10) 100 MG CAPS Take by mouth daily.   cyanocobalamin (VITAMIN B12) 500 MCG tablet Take 500 mcg by mouth daily.   famotidine (PEPCID) 40 MG tablet TAKE 1 TABLET BY MOUTH EVERY DAY   loratadine (CLARITIN) 10 MG tablet Take 10 mg by mouth daily.   Multiple Vitamin (MULTIVITAMIN) tablet Take 1 tablet by mouth daily.   rosuvastatin (CRESTOR) 5 MG tablet TAKE 1 TABLET BY MOUTH EVERY DAY   S-Adenosylmethionine (SAM-E) 200 MG TABS Take by mouth daily.   Saw Palmetto 450 MG CAPS Take by mouth daily.   Turmeric 500 MG CAPS Take by mouth daily.        Objective:   Physical Exam  wts  05/19/2023       *** 02/05/2023        163  08/20/2021        158   02/07/2021        160  08/07/2020        161   01/27/2020           162  12/11/2018        160 11/25/2017          165  05/27/2017        166  11/25/2016          167  06/27/2016          163 05/30/2016        168   02/29/16 169 lb 12.8 oz (77 kg)  01/25/16 160 lb 8 oz (72.8 kg)  01/23/15 170 lb (77.1 kg)    Vital signs reviewed  05/19/2023  - Note at rest 02 sats  ***% on ***   General appearance:    ***       Assessment:

## 2023-05-19 ENCOUNTER — Encounter: Payer: Self-pay | Admitting: Internal Medicine

## 2023-05-19 ENCOUNTER — Ambulatory Visit (INDEPENDENT_AMBULATORY_CARE_PROVIDER_SITE_OTHER): Payer: Medicare Other | Admitting: Internal Medicine

## 2023-05-19 VITALS — BP 136/84 | HR 74 | Temp 98.4°F | Ht 71.0 in | Wt 164.4 lb

## 2023-05-19 DIAGNOSIS — J8410 Pulmonary fibrosis, unspecified: Secondary | ICD-10-CM | POA: Diagnosis not present

## 2023-05-19 MED ORDER — PANTOPRAZOLE SODIUM 40 MG PO TBEC: 40.0000 mg | DELAYED_RELEASE_TABLET | Freq: Every day | ORAL | 2 refills | Status: DC

## 2023-05-19 NOTE — Patient Instructions (Addendum)
Resume Pantoprazole (protonix) 40 mg   Take  30-60 min before first meal of the day and Pepcid (famotidine)  40 mg after supper until return to office - this is the best way to tell whether stomach acid is contributing to your problem.    Make sure you check your oxygen saturation at your highest level of activity(NOT after you stop)  to be sure it stays over 90% and keep track of it at least once a week, more often if breathing getting worse, and let me know if losing ground. (Collect the dots to connect the dots approach)    See Dr Jenne Pane about your sinus drainage and hoarseness  - take meds with you  I will be referring you to Pulmonary fibrosis clinic.

## 2023-05-20 ENCOUNTER — Encounter: Payer: Self-pay | Admitting: Internal Medicine

## 2023-05-20 NOTE — Assessment & Plan Note (Addendum)
Incidental finding/ pt asymptomatic Ct scan 01/2016:  No suspicious pulmonary nodules. Changes of pulmonary fibrosis, most pronounced in the mid and lower lung zones. - PFT's  05/30/2016  FEV1 3.07 (101 % ) ratio 81  p 2 % improvement from saba p nothing  prior to study with DLCO  54/59 % corrects to 76 % for alv volume   - HRCT  01/24/2017 1. There is a spectrum of findings compatible with interstitial lung disease, an although the overall appearance is considered a "probable UIP CT pattern", the lack of progression compared to the prior study, lack of frank honeycombing and presence of air trapping raises the possibility of fibrotic phase nonspecific interstitial pneumonia (NSIP).  - PFT's  11/25/2017   FVC 3.80 (91%) with DLCO  57 % corrects to 78 % for alv volume  - PFT's  12/11/2018 FVC 3.75 (91%)   with DLCO  60 % corrects to 73  % for alv volume   -  01/27/2020   Walked RA  2 laps @ approx 23ft each @ fast pace  stopped due to end of study, no sob with sats 89% at end  - HRCT 02/14/2020   Minimal change UIP pattern  -  08/07/2020   Walked RA  2 laps @ approx 214ft each @ fast pace  stopped due to end of study,  no sob with sats  93% at end  - 02/07/2021   Walked on RA x  2  lap(s) =  approx 500 ft @ nl pace, stopped due to end of study  with lowest 02 sats 91% at end   - 08/20/2021 reports no change ex tol on treadmill at home x 40 y and no change in peak 02 sats never below 91% and declined PF clinic . Off ppi  And on H2 prophylactically  - 02/05/2023   Walked on RA  x  3  lap(s) =  approx 750  ft  @ brisk pace, stopped due to end of study with lowest 02 sats 90%   - HRCT chest  02/13/23 slt prgression dx UIP since 2021 > refer to PF clinic if willing > declined  >>>   05/19/2023   Walked on RA  x  3  lap(s) =  approx 750  ft  @ mod fast pace, stopped due to end of study  with lowest 02 sats 92% but reporting high 80s on his regular walk so rec resume ppi /h2 in pm and referred to PF clinic    He  admits he's slowing down on his regular walk and still not maintaining now abov 90% so strongly rec he go ahead and pursue eval at PF clinic  The gerd rx is based on data suggesting that use  of PPI is associated with improved survival time and with decreased radiologic fibrosis per King's study published in [facility] vol 184 p1390.  Dec 2011 and also may have other beneficial effects as per the latest review in [place] vol 193 p1345 Jun 20016.  Discussed in detail all the  indications, usual  risks and alternatives  relative to the benefits with patient who agrees to proceed with Rx as outlined.       Each maintenance medication was reviewed in detail including emphasizing most importantly the difference between maintenance and prns and under what circumstances the prns are to be triggered using an action plan format where appropriate.  Total time for H and P, chart review, counseling,  directly observing  portions of ambulatory 02 saturation study/ and generating customized AVS unique to this office visit / same day charting = 

## 2023-06-26 DIAGNOSIS — H5053 Vertical heterophoria: Secondary | ICD-10-CM | POA: Diagnosis not present

## 2023-06-26 DIAGNOSIS — H31001 Unspecified chorioretinal scars, right eye: Secondary | ICD-10-CM | POA: Diagnosis not present

## 2023-06-26 DIAGNOSIS — H53143 Visual discomfort, bilateral: Secondary | ICD-10-CM | POA: Diagnosis not present

## 2023-07-06 ENCOUNTER — Other Ambulatory Visit: Payer: Self-pay | Admitting: Internal Medicine

## 2023-07-15 DIAGNOSIS — L570 Actinic keratosis: Secondary | ICD-10-CM | POA: Diagnosis not present

## 2023-07-15 DIAGNOSIS — L821 Other seborrheic keratosis: Secondary | ICD-10-CM | POA: Diagnosis not present

## 2023-07-15 DIAGNOSIS — L218 Other seborrheic dermatitis: Secondary | ICD-10-CM | POA: Diagnosis not present

## 2023-07-22 DIAGNOSIS — J320 Chronic maxillary sinusitis: Secondary | ICD-10-CM | POA: Diagnosis not present

## 2023-07-22 DIAGNOSIS — J342 Deviated nasal septum: Secondary | ICD-10-CM | POA: Diagnosis not present

## 2023-07-22 DIAGNOSIS — R42 Dizziness and giddiness: Secondary | ICD-10-CM | POA: Diagnosis not present

## 2023-07-23 ENCOUNTER — Other Ambulatory Visit: Payer: Self-pay | Admitting: Internal Medicine

## 2023-08-14 ENCOUNTER — Other Ambulatory Visit: Payer: Self-pay | Admitting: Internal Medicine

## 2023-08-25 ENCOUNTER — Other Ambulatory Visit (INDEPENDENT_AMBULATORY_CARE_PROVIDER_SITE_OTHER): Payer: Medicare Other

## 2023-08-25 DIAGNOSIS — D649 Anemia, unspecified: Secondary | ICD-10-CM | POA: Diagnosis not present

## 2023-08-25 DIAGNOSIS — N1831 Chronic kidney disease, stage 3a: Secondary | ICD-10-CM

## 2023-08-25 DIAGNOSIS — E1169 Type 2 diabetes mellitus with other specified complication: Secondary | ICD-10-CM

## 2023-08-25 DIAGNOSIS — E782 Mixed hyperlipidemia: Secondary | ICD-10-CM | POA: Diagnosis not present

## 2023-08-25 LAB — COMPREHENSIVE METABOLIC PANEL
ALT: 14 U/L (ref 0–53)
AST: 20 U/L (ref 0–37)
Albumin: 3.9 g/dL (ref 3.5–5.2)
Alkaline Phosphatase: 34 U/L — ABNORMAL LOW (ref 39–117)
BUN: 14 mg/dL (ref 6–23)
CO2: 27 meq/L (ref 19–32)
Calcium: 9.2 mg/dL (ref 8.4–10.5)
Chloride: 104 meq/L (ref 96–112)
Creatinine, Ser: 1.25 mg/dL (ref 0.40–1.50)
GFR: 52.64 mL/min — ABNORMAL LOW (ref >60.00)
Glucose, Bld: 164 mg/dL — ABNORMAL HIGH (ref 70–99)
Potassium: 4.1 meq/L (ref 3.5–5.1)
Sodium: 139 meq/L (ref 135–145)
Total Bilirubin: 1.3 mg/dL — ABNORMAL HIGH (ref 0.2–1.2)
Total Protein: 6.8 g/dL (ref 6.0–8.3)

## 2023-08-25 LAB — CBC WITH DIFFERENTIAL/PLATELET
Basophils Absolute: 0.1 10*3/uL (ref 0.0–0.1)
Basophils Relative: 0.9 % (ref 0.0–3.0)
Eosinophils Absolute: 0.3 10*3/uL (ref 0.0–0.7)
Eosinophils Relative: 5 % (ref 0.0–5.0)
HCT: 38.8 % — ABNORMAL LOW (ref 39.0–52.0)
Hemoglobin: 13.7 g/dL (ref 13.0–17.0)
Lymphocytes Relative: 15.3 % (ref 12.0–46.0)
Lymphs Abs: 1 10*3/uL (ref 0.7–4.0)
MCHC: 35.3 g/dL (ref 30.0–36.0)
MCV: 99.9 fl (ref 78.0–100.0)
Monocytes Absolute: 0.6 10*3/uL (ref 0.1–1.0)
Monocytes Relative: 9.8 % (ref 3.0–12.0)
Neutro Abs: 4.6 10*3/uL (ref 1.4–7.7)
Neutrophils Relative %: 69 % (ref 43.0–77.0)
Platelets: 212 10*3/uL (ref 150.0–400.0)
RBC: 3.88 Mil/uL — ABNORMAL LOW (ref 4.22–5.81)
RDW: 12.5 % (ref 11.5–15.5)
WBC: 6.6 10*3/uL (ref 4.0–10.5)

## 2023-08-25 LAB — LIPID PANEL
Cholesterol: 89 mg/dL (ref 0–200)
HDL: 40.7 mg/dL (ref >39.00)
LDL Cholesterol: 36 mg/dL (ref 0–99)
NonHDL: 48.33
Total CHOL/HDL Ratio: 2
Triglycerides: 61 mg/dL (ref 0.0–149.0)
VLDL: 12.2 mg/dL (ref 0.0–40.0)

## 2023-08-25 LAB — MICROALBUMIN / CREATININE URINE RATIO
Creatinine,U: 120.7 mg/dL
Microalb Creat Ratio: 8.7 mg/g (ref 0.0–30.0)
Microalb, Ur: 1 mg/dL (ref 0.0–1.9)

## 2023-08-25 LAB — HEMOGLOBIN A1C: Hgb A1c MFr Bld: 6.6 % — ABNORMAL HIGH (ref 4.6–6.5)

## 2023-08-31 ENCOUNTER — Encounter: Payer: Self-pay | Admitting: Internal Medicine

## 2023-08-31 NOTE — Patient Instructions (Addendum)
      Blood work was ordered for your next visit.       Medications changes include :   None    A referral was ordered and someone will call you to schedule an appointment.     Return in about 6 months (around 03/03/2024) for follow up, schedule lab appt.

## 2023-08-31 NOTE — Progress Notes (Unsigned)
 Subjective:    Patient ID: Walter Hess, male    DOB: 08/27/1940, 83 y.o.   MRN: [id number]     HPI Walter Hess is here for follow up of his chronic medical problems.  Exercises 6 days / week for an hour or so.    He has been eating a little more dessert.    If he walks up a steep hill he does feel a little SOB.   Medications and allergies reviewed with patient and updated if appropriate.  Current Outpatient Medications on File Prior to Visit  Medication Sig Dispense Refill   aspirin 81 MG tablet Take 81 mg by mouth daily.     betamethasone dipropionate 0.05 % cream Apply topically 2 (two) times daily as needed.     Cinnamon 500 MG TABS Take by mouth.     Coenzyme Q10 (COQ10) 100 MG CAPS Take by mouth daily.     cyanocobalamin (VITAMIN B12) 500 MCG tablet Take 500 mcg by mouth daily.     famotidine (PEPCID) 40 MG tablet TAKE 1 TABLET BY MOUTH EVERY DAY 90 tablet 1   loratadine (CLARITIN) 10 MG tablet Take 10 mg by mouth daily.     Multiple Vitamin (MULTIVITAMIN) tablet Take 1 tablet by mouth daily.     pantoprazole (PROTONIX) 40 MG tablet TAKE 1 TABLET (40 MG TOTAL) BY MOUTH DAILY. TAKE 30-60 MIN BEFORE FIRST MEAL OF THE DAY 90 tablet 1   rosuvastatin (CRESTOR) 5 MG tablet TAKE 1 TABLET BY MOUTH EVERY DAY 90 tablet 1   S-Adenosylmethionine (SAM-E) 200 MG TABS Take by mouth daily.     Saw Palmetto 450 MG CAPS Take by mouth daily.     Turmeric 500 MG CAPS Take by mouth daily.     No current facility-administered medications on file prior to visit.     Review of Systems  Constitutional:  Negative for fever.  HENT:  Negative for trouble swallowing.   Respiratory:  Positive for cough (in the morning - gets mucus up - then ok rest of day) and shortness of breath (only when walking up a steep hill). Negative for wheezing.   Cardiovascular:  Negative for chest pain, palpitations and leg swelling.  Gastrointestinal:  Negative for abdominal pain.  Neurological:  Negative for  light-headedness and headaches.       Objective:   Vitals:   09/01/23 0900  BP: 132/74  Pulse: 95  Temp: 97.8 F (36.6 C)  SpO2: 97%   BP Readings from Last 3 Encounters:  09/01/23 132/74  05/19/23 136/84  03/03/23 134/80   Wt Readings from Last 3 Encounters:  09/01/23 163 lb (73.9 kg)  05/19/23 164 lb 6.4 oz (74.6 kg)  03/03/23 164 lb (74.4 kg)   Body mass index is 22.73 kg/m.    Physical Exam Constitutional:      General: He is not in acute distress.    Appearance: Normal appearance. He is not ill-appearing.  HENT:     Head: Normocephalic and atraumatic.  Eyes:     Conjunctiva/sclera: Conjunctivae normal.  Cardiovascular:     Rate and Rhythm: Normal rate and regular rhythm.     Heart sounds: Normal heart sounds.  Pulmonary:     Effort: Pulmonary effort is normal. No respiratory distress.     Breath sounds: Rales (bibasilar crackles 1/2 way up) present. No wheezing.  Musculoskeletal:     Right lower leg: No edema.     Left lower leg: No edema.  Skin:    General: Skin is warm and dry.     Findings: No rash.  Neurological:     Mental Status: He is alert. Mental status is at baseline.  Psychiatric:        Mood and Affect: Mood normal.        Lab Results  Component Value Date   WBC 6.6 08/25/2023   HGB 13.7 08/25/2023   HCT 38.8 (L) 08/25/2023   PLT 212.0 08/25/2023   GLUCOSE 164 (H) 08/25/2023   CHOL 89 08/25/2023   TRIG 61.0 08/25/2023   HDL 40.70 08/25/2023   LDLCALC 36 08/25/2023   ALT 14 08/25/2023   AST 20 08/25/2023   NA 139 08/25/2023   K 4.1 08/25/2023   CL 104 08/25/2023   CREATININE 1.25 08/25/2023   BUN 14 08/25/2023   CO2 27 08/25/2023   TSH 4.20 01/27/2017   HGBA1C 6.6 (H) 08/25/2023   MICROALBUR 1.0 08/25/2023     Assessment & Plan:    See Problem List for Assessment and Plan of chronic medical problems.

## 2023-09-01 ENCOUNTER — Ambulatory Visit (INDEPENDENT_AMBULATORY_CARE_PROVIDER_SITE_OTHER): Payer: Medicare Other

## 2023-09-01 ENCOUNTER — Ambulatory Visit (INDEPENDENT_AMBULATORY_CARE_PROVIDER_SITE_OTHER): Payer: Medicare Other | Admitting: Internal Medicine

## 2023-09-01 ENCOUNTER — Encounter: Payer: Self-pay | Admitting: Internal Medicine

## 2023-09-01 VITALS — BP 132/74 | HR 95 | Temp 97.8°F | Ht 71.0 in | Wt 163.0 lb

## 2023-09-01 VITALS — BP 132/74 | HR 75 | Ht 71.0 in | Wt 163.0 lb

## 2023-09-01 DIAGNOSIS — K219 Gastro-esophageal reflux disease without esophagitis: Secondary | ICD-10-CM | POA: Diagnosis not present

## 2023-09-01 DIAGNOSIS — I700 Atherosclerosis of aorta: Secondary | ICD-10-CM

## 2023-09-01 DIAGNOSIS — N1831 Chronic kidney disease, stage 3a: Secondary | ICD-10-CM | POA: Diagnosis not present

## 2023-09-01 DIAGNOSIS — D649 Anemia, unspecified: Secondary | ICD-10-CM | POA: Diagnosis not present

## 2023-09-01 DIAGNOSIS — Z0000 Encounter for general adult medical examination without abnormal findings: Secondary | ICD-10-CM | POA: Diagnosis not present

## 2023-09-01 DIAGNOSIS — R350 Frequency of micturition: Secondary | ICD-10-CM

## 2023-09-01 DIAGNOSIS — E782 Mixed hyperlipidemia: Secondary | ICD-10-CM

## 2023-09-01 DIAGNOSIS — N401 Enlarged prostate with lower urinary tract symptoms: Secondary | ICD-10-CM | POA: Diagnosis not present

## 2023-09-01 DIAGNOSIS — E1169 Type 2 diabetes mellitus with other specified complication: Secondary | ICD-10-CM | POA: Diagnosis not present

## 2023-09-01 NOTE — Assessment & Plan Note (Addendum)
 Chronic Lab Results  Component Value Date   LDLCALC 36 08/25/2023   Cholesterol very well-controlled Continue Crestor 5 mg daily

## 2023-09-01 NOTE — Assessment & Plan Note (Addendum)
 Chronic LDL well controlled Lab Results  Component Value Date   LDLCALC 36 08/25/2023   Continue Crestor 5 mg daily Continue healthy diet, regular exercise  EKG today - NSR @ 92 bpm, possible LAE, Q waves II, III, aVF but unlikely inferior infarct - unchanged from prior EKG from 2022.  Echo w/o evidence of infarct.  No further evaluation needed

## 2023-09-01 NOTE — Patient Instructions (Addendum)
 Walter Hess , Thank you for taking time to come for your Medicare Wellness Visit. I appreciate your ongoing commitment to your health goals. Please review the following plan we discussed and let me know if I can assist you in the future.   Referrals/Orders/Follow-Ups/Clinician Recommendations: Aim for 30 minutes of exercise or brisk walking, 6-8 glasses of water, and 5 servings of fruits and vegetables each day.   This is a list of the screening recommended for you and due dates:  Health Maintenance  Topic Date Due   Complete foot exam   Never done   Hemoglobin A1C  02/25/2024   Eye exam for diabetics  06/25/2024   DTaP/Tdap/Td vaccine (3 - Td or Tdap) 07/01/2024   Yearly kidney function blood test for diabetes  08/24/2024   Yearly kidney health urinalysis for diabetes  08/24/2024   Medicare Annual Wellness Visit  08/31/2024   Pneumonia Vaccine  Completed   Flu Shot  Completed   COVID-19 Vaccine  Completed   Zoster (Shingles) Vaccine  Completed   HPV Vaccine  Aged Out    Advanced directives: (Copy Requested) Please bring a copy of your health care power of attorney and living will to the office to be added to your chart at your convenience. You can mail to [facility] [address] [place], [place] [address] or email to [email or web address]  Next Medicare Annual Wellness Visit scheduled for next year: Yes

## 2023-09-01 NOTE — Progress Notes (Signed)
 Subjective:   Walter Hess is a 83 y.o. who presents for a Medicare Wellness preventive visit.  Visit Complete: In person AWV Questionnaire: No: Patient Medicare AWV questionnaire was not completed prior to this visit.  Cardiac Risk Factors include: advanced age (>41men, >46 women);diabetes mellitus;dyslipidemia;male gender     Objective:    Today's Vitals   09/01/23 1020  BP: 132/74  Pulse: 75  SpO2: 98%  Weight: 163 lb (73.9 kg)  Height: 5\' 11"  (1.803 m)   Body mass index is 22.73 kg/m.     09/01/2023   10:22 AM 02/27/2021   10:58 AM 02/24/2020   12:00 PM 02/23/2019   10:04 AM 04/14/2014    1:45 PM  Advanced Directives  Does Patient Have a Medical Advance Directive? Yes Yes Yes Yes Yes  Type of Estate agent of [place];Living will  Living will;Healthcare Power of [employer] Power of [place];Living will Living will;Healthcare Power of Attorney  Does patient want to make changes to medical advance directive?  No - Patient declined No - Patient declined    Copy of Healthcare Power of Attorney in Chart? No - copy requested  No - copy requested No - copy requested     Current Medications (verified) Outpatient Encounter Medications as of 09/01/2023  Medication Sig   aspirin 81 MG tablet Take 81 mg by mouth daily.   betamethasone dipropionate 0.05 % cream Apply topically 2 (two) times daily as needed.   Cinnamon 500 MG TABS Take by mouth.   Coenzyme Q10 (COQ10) 100 MG CAPS Take by mouth daily.   cyanocobalamin (VITAMIN B12) 500 MCG tablet Take 500 mcg by mouth daily.   famotidine (PEPCID) 40 MG tablet TAKE 1 TABLET BY MOUTH EVERY DAY   loratadine (CLARITIN) 10 MG tablet Take 10 mg by mouth daily.   Multiple Vitamin (MULTIVITAMIN) tablet Take 1 tablet by mouth daily.   pantoprazole (PROTONIX) 40 MG tablet TAKE 1 TABLET (40 MG TOTAL) BY MOUTH DAILY. TAKE 30-60 MIN BEFORE FIRST MEAL OF THE DAY   rosuvastatin (CRESTOR) 5 MG tablet TAKE 1 TABLET  BY MOUTH EVERY DAY   S-Adenosylmethionine (SAM-E) 200 MG TABS Take by mouth daily.   Saw Palmetto 450 MG CAPS Take by mouth daily.   Turmeric 500 MG CAPS Take by mouth daily.   No facility-administered encounter medications on file as of 09/01/2023.    Allergies (verified) Patient has no known allergies.   History: Past Medical History:  Diagnosis Date   Anemia    former blood donor for decades; stopped 2006   Diverticulosis 2005   Dr. Juanda Chance   Elevated prostate specific antigen (PSA)    Dr. Logan Bores [facility] patient states due to infection   Gilbert's syndrome    Hyperlipidemia    Past Surgical History:  Procedure Laterality Date   CATARACT EXTRACTION, BILATERAL  2017   COLONOSCOPY  2005   Diverticulosis, Dr.Brodie.   GANGLION CYST EXCISION  2005   5th L digit   LUMBAR LAMINECTOMY  1981   Family History  Problem Relation Age of Onset   Heart attack Father 80       former smoker   Alzheimer's disease Mother    Coronary artery disease Sister        open heart surgery   Diabetes Paternal Aunt    Heart attack Paternal Uncle 30   Coronary artery disease Paternal Grandfather    Prostate cancer Paternal Grandfather    Prostate cancer Maternal Uncle  Stroke Neg Hx    Colon cancer Neg Hx    Social History   Socioeconomic History   Marital status: Married    Spouse name: Not on file   Number of children: 2   Years of education: Not on file   Highest education level: Not on file  Occupational History   Occupation: retired  Tobacco Use   Smoking status: Former    Current packs/day: 0.00    Average packs/day: 0.5 packs/day for 5.0 years (2.5 ttl pk-yrs)    Types: Cigarettes    Start date: 06/24/1958    Quit date: 06/25/1963    Years since quitting: 60.2    Passive exposure: Past   Smokeless tobacco: Never  Vaping Use   Vaping status: Never Used  Substance and Sexual Activity   Alcohol use: Not Currently   Drug use: No   Sexual activity: Not Currently  Other Topics  Concern   Not on file  Social History Narrative   Not on file   Social Drivers of Health   Financial Resource Strain: Low Risk  (09/01/2023)   Overall Financial Resource Strain (CARDIA)    Difficulty of Paying Living Expenses: Not hard at all  Food Insecurity: No Food Insecurity (09/01/2023)   Hunger Vital Sign    Worried About Running Out of Food in the Last Year: Never true    Ran Out of Food in the Last Year: Never true  Transportation Needs: No Transportation Needs (09/01/2023)   PRAPARE - Administrator, Civil Service (Medical): No    Lack of Transportation (Non-Medical): No  Physical Activity: Sufficiently Active (09/01/2023)   Exercise Vital Sign    Days of Exercise per Week: 6 days    Minutes of Exercise per Session: 120 min  Stress: No Stress Concern Present (09/01/2023)   [employer] of Occupational Health - Occupational Stress Questionnaire    Feeling of Stress : Not at all  Social Connections: Socially Integrated (09/01/2023)   Social Connection and Isolation Panel [NHANES]    Frequency of Communication with Friends and Family: More than three times a week    Frequency of Social Gatherings with Friends and Family: More than three times a week    Attends Religious Services: More than 4 times per year    Active Member of [employer] or Organizations: Yes    Attends Engineer, structural: More than 4 times per year    Marital Status: Married    Tobacco Counseling - Former Smoker Counseling given: Yes    Clinical Intake:  Pre-visit preparation completed: Yes  Pain : No/denies pain     BMI - recorded: 22.73 Nutritional Risks: None Diabetes: No  How often do you need to have someone help you when you read instructions, pamphlets, or other written materials from your doctor or pharmacy?: 1 - Never  Interpreter Needed?: No  Information entered by :: Hassell Halim, CMA   Activities of Daily Living     09/01/2023   10:23 AM  In your present  state of health, do you have any difficulty performing the following activities:  Hearing? 0  Vision? 0  Difficulty concentrating or making decisions? 0  Walking or climbing stairs? 0  Dressing or bathing? 0  Doing errands, shopping? 0  Preparing Food and eating ? N  Using the Toilet? N  In the past six months, have you accidently leaked urine? N  Do you have problems with loss of bowel control? N  Managing  your Medications? N  Managing your Finances? N  Housekeeping or managing your Housekeeping? N    Patient Care Team: Pincus Sanes, MD as PCP - General (Internal Medicine) Nyoka Cowden, MD as Consulting Physician (Pulmonary Disease) Jamison Neighbor, MD (Urology) Blima Ledger, OD as Consulting Physician (Optometry)  Indicate any recent Medical Services you may have received from other than [facility] providers in the past year (date may be approximate).     Assessment:   This is a routine wellness examination for Add.  Hearing/Vision screen Hearing Screening - Comments:: Wears hearing aids Vision Screening - Comments:: Wears rx glasses - up to date with routine eye exams with Blima Ledger   Goals Addressed               This Visit's Progress     Patient Stated (pt-stated)        Patient stated he plans staying active and exercising and reduce sweet intake.       Depression Screen     09/01/2023   10:26 AM 09/01/2023    9:12 AM 08/29/2022    9:30 AM 02/28/2022   10:53 AM 08/21/2021    9:59 AM 02/27/2021   11:33 AM 02/24/2020   11:57 AM  PHQ 2/9 Scores  PHQ - 2 Score 0 0 0 0 0 0 0  PHQ- 9 Score 0          Fall Risk     09/01/2023   10:27 AM 09/01/2023    9:12 AM 08/29/2022    9:30 AM 02/28/2022   11:17 AM 02/28/2022   10:54 AM  Fall Risk   Falls in the past year? 0 0 0 0 0  Number falls in past yr: 0 0 0 0 0  Injury with Fall? 0 0 0 0 0  Risk for fall due to : No Fall Risks No Fall Risks No Fall Risks No Fall Risks No Fall Risks  Follow up Falls evaluation  completed;Falls prevention discussed Falls evaluation completed Falls evaluation completed Falls evaluation completed Falls prevention discussed    MEDICARE RISK AT HOME:  Medicare Risk at Home Any stairs in or around the home?: No If so, are there any without handrails?: No Home free of loose throw rugs in walkways, pet beds, electrical cords, etc?: Yes Adequate lighting in your home to reduce risk of falls?: Yes Life alert?: No Use of a cane, walker or w/c?: No Grab bars in the bathroom?: Yes Shower chair or bench in shower?: Yes Elevated toilet seat or a handicapped toilet?: Yes  TIMED UP AND GO:  Was the test performed?  No  Cognitive Function: 6CIT completed        09/01/2023   10:28 AM 02/28/2022   10:55 AM 02/24/2020   12:03 PM  6CIT Screen  What Year? 0 points 0 points 0 points  What month? 0 points 0 points 0 points  What time? 0 points 0 points 0 points  Count back from 20 0 points 0 points 0 points  Months in reverse 0 points 0 points 0 points  Repeat phrase 2 points 0 points 0 points  Total Score 2 points 0 points 0 points    Immunizations Immunization History  Administered Date(s) Administered   Fluad Quad(high Dose 65+) 02/26/2019, 03/25/2019, 04/06/2021, 02/28/2022   Fluad Trivalent(High Dose 65+) 02/28/2023   Influenza Split 04/03/2011, 04/08/2012   Influenza Whole 04/07/2007, 03/31/2008, 03/22/2009, 03/27/2010   Influenza, High Dose Seasonal PF 03/25/2013, 03/23/2014, 03/14/2017,  03/20/2018, 03/14/2020   Influenza-Unspecified 03/14/2015, 02/08/2016, 03/14/2017   PFIZER(Purple Top)SARS-COV-2 Vaccination 07/07/2019, 07/27/2019, 03/22/2020   Pfizer Covid-19 Vaccine Bivalent Booster 75yrs & up 06/05/2021   Pfizer(Comirnaty)Fall Seasonal Vaccine 12 years and older 05/30/2022, 04/07/2023   Pneumococcal Conjugate-13 01/23/2015   Pneumococcal Polysaccharide-23 10/05/2009   Respiratory Syncytial Virus Vaccine,Recomb Aduvanted(Arexvy) 06/27/2022   Tdap 04/26/2004    Tetanus 07/01/2014   Zoster Recombinant(Shingrix) 03/11/2022, 08/09/2022   Zoster, Live 06/25/2011    Screening Tests Health Maintenance  Topic Date Due   FOOT EXAM  Never done   HEMOGLOBIN A1C  02/25/2024   OPHTHALMOLOGY EXAM  06/25/2024   DTaP/Tdap/Td (3 - Td or Tdap) 07/01/2024   Diabetic kidney evaluation - eGFR measurement  08/24/2024   Diabetic kidney evaluation - Urine ACR  08/24/2024   Medicare Annual Wellness (AWV)  08/31/2024   Pneumonia Vaccine 38+ Years old  Completed   INFLUENZA VACCINE  Completed   COVID-19 Vaccine  Completed   Zoster Vaccines- Shingrix  Completed   HPV VACCINES  Aged Out    Health Maintenance  Health Maintenance Due  Topic Date Due   FOOT EXAM  Never done   Health Maintenance Items Addressed:09/01/2023   Additional Screening:  Vision Screening: Recommended annual ophthalmology exams for early detection of glaucoma and other disorders of the eye. Pt stated had eye exam with Blima Ledger in 06/2023.  Dental Screening: Recommended annual dental exams for proper oral hygiene  Community Resource Referral / Chronic Care Management: CRR required this visit?  No   CCM required this visit?  No     Plan:     I have personally reviewed and noted the following in the patient's chart:   Medical and social history Use of alcohol, tobacco or illicit drugs  Current medications and supplements including opioid prescriptions. Patient is not currently taking opioid prescriptions. Functional ability and status Nutritional status Physical activity Advanced directives List of other physicians Hospitalizations, surgeries, and ER visits in previous 12 months Vitals Screenings to include cognitive, depression, and falls Referrals and appointments  In addition, I have reviewed and discussed with patient certain preventive protocols, quality metrics, and best practice recommendations. A written personalized care plan for preventive services as well  as general preventive health recommendations were provided to patient.     Darreld Mclean, CMA   09/01/2023   After Visit Summary: (MyChart) Due to this being a telephonic visit, the after visit summary with patients personalized plan was offered to patient via MyChart   Notes: Nothing significant to report at this time.

## 2023-09-01 NOTE — Assessment & Plan Note (Addendum)
History of anemia No anemia with most recent blood work MCV slightly elevated - he has been taking a B12 daily - continue Continue multivitamin Will continue to monitor

## 2023-09-01 NOTE — Assessment & Plan Note (Signed)
 Chronic Taking saw palmetto

## 2023-09-01 NOTE — Assessment & Plan Note (Signed)
 Chronic With hyperlipidemia Stable  Lab Results  Component Value Date   HGBA1C 6.6 (H) 08/25/2023   Sugars well controlled Testing sugars 1 times a day Continue lifestyle control Stressed regular exercise, diabetic diet

## 2023-09-01 NOTE — Assessment & Plan Note (Addendum)
 Chronic Denies GERD-never really had reflux, but had cough and decreased lung function Continue pantoprazole 40 mg daily, famotidine 40 mg daily

## 2023-09-01 NOTE — Assessment & Plan Note (Signed)
Chronic Kidney function is stable and mild No change in medication.

## 2023-12-30 ENCOUNTER — Other Ambulatory Visit: Payer: Self-pay | Admitting: Internal Medicine

## 2023-12-30 DIAGNOSIS — H6123 Impacted cerumen, bilateral: Secondary | ICD-10-CM | POA: Diagnosis not present

## 2024-01-14 DIAGNOSIS — L821 Other seborrheic keratosis: Secondary | ICD-10-CM | POA: Diagnosis not present

## 2024-01-14 DIAGNOSIS — D485 Neoplasm of uncertain behavior of skin: Secondary | ICD-10-CM | POA: Diagnosis not present

## 2024-01-14 DIAGNOSIS — L814 Other melanin hyperpigmentation: Secondary | ICD-10-CM | POA: Diagnosis not present

## 2024-01-14 DIAGNOSIS — L720 Epidermal cyst: Secondary | ICD-10-CM | POA: Diagnosis not present

## 2024-01-14 DIAGNOSIS — L570 Actinic keratosis: Secondary | ICD-10-CM | POA: Diagnosis not present

## 2024-01-14 DIAGNOSIS — D692 Other nonthrombocytopenic purpura: Secondary | ICD-10-CM | POA: Diagnosis not present

## 2024-01-23 ENCOUNTER — Other Ambulatory Visit: Payer: Self-pay | Admitting: Internal Medicine

## 2024-02-06 ENCOUNTER — Other Ambulatory Visit: Payer: Self-pay | Admitting: Internal Medicine

## 2024-02-25 ENCOUNTER — Other Ambulatory Visit (INDEPENDENT_AMBULATORY_CARE_PROVIDER_SITE_OTHER)

## 2024-02-25 DIAGNOSIS — N1831 Chronic kidney disease, stage 3a: Secondary | ICD-10-CM | POA: Diagnosis not present

## 2024-02-25 DIAGNOSIS — E1169 Type 2 diabetes mellitus with other specified complication: Secondary | ICD-10-CM

## 2024-02-25 DIAGNOSIS — E782 Mixed hyperlipidemia: Secondary | ICD-10-CM

## 2024-02-25 LAB — COMPREHENSIVE METABOLIC PANEL WITH GFR
ALT: 14 U/L (ref 0–53)
AST: 18 U/L (ref 0–37)
Albumin: 3.9 g/dL (ref 3.5–5.2)
Alkaline Phosphatase: 38 U/L — ABNORMAL LOW (ref 39–117)
BUN: 15 mg/dL (ref 6–23)
CO2: 30 meq/L (ref 19–32)
Calcium: 9 mg/dL (ref 8.4–10.5)
Chloride: 104 meq/L (ref 96–112)
Creatinine, Ser: 1.33 mg/dL (ref 0.40–1.50)
GFR: 48.69 mL/min — ABNORMAL LOW (ref >60.00)
Glucose, Bld: 148 mg/dL — ABNORMAL HIGH (ref 70–99)
Potassium: 4.9 meq/L (ref 3.5–5.1)
Sodium: 140 meq/L (ref 135–145)
Total Bilirubin: 0.9 mg/dL (ref 0.2–1.2)
Total Protein: 6.9 g/dL (ref 6.0–8.3)

## 2024-02-25 LAB — CBC WITH DIFFERENTIAL/PLATELET
Basophils Absolute: 0 K/uL (ref 0.0–0.1)
Basophils Relative: 0.7 % (ref 0.0–3.0)
Eosinophils Absolute: 0.3 K/uL (ref 0.0–0.7)
Eosinophils Relative: 4.7 % (ref 0.0–5.0)
HCT: 39.2 % (ref 39.0–52.0)
Hemoglobin: 13.8 g/dL (ref 13.0–17.0)
Lymphocytes Relative: 13.6 % (ref 12.0–46.0)
Lymphs Abs: 0.9 K/uL (ref 0.7–4.0)
MCHC: 35.1 g/dL (ref 30.0–36.0)
MCV: 100.1 fl — ABNORMAL HIGH (ref 78.0–100.0)
Monocytes Absolute: 0.7 K/uL (ref 0.1–1.0)
Monocytes Relative: 9.8 % (ref 3.0–12.0)
Neutro Abs: 4.8 K/uL (ref 1.4–7.7)
Neutrophils Relative %: 71.2 % (ref 43.0–77.0)
Platelets: 197 K/uL (ref 150.0–400.0)
RBC: 3.91 Mil/uL — ABNORMAL LOW (ref 4.22–5.81)
RDW: 12.8 % (ref 11.5–15.5)
WBC: 6.7 K/uL (ref 4.0–10.5)

## 2024-02-25 LAB — LIPID PANEL
Cholesterol: 99 mg/dL (ref 0–200)
HDL: 42.8 mg/dL (ref >39.00)
LDL Cholesterol: 45 mg/dL (ref 0–99)
NonHDL: 56.68
Total CHOL/HDL Ratio: 2
Triglycerides: 58 mg/dL (ref 0.0–149.0)
VLDL: 11.6 mg/dL (ref 0.0–40.0)

## 2024-02-25 LAB — HEMOGLOBIN A1C: Hgb A1c MFr Bld: 7 % — ABNORMAL HIGH (ref 4.6–6.5)

## 2024-02-29 NOTE — Progress Notes (Addendum)
      Subjective:    Patient ID: Walter Hess, male    DOB: 1941-03-30, 83 y.o.   MRN: [id number]     HPI Walter Hess is here for follow up of his chronic medical problems.  Had labs  Medications and allergies reviewed with patient and updated if appropriate.  Current Outpatient Medications on File Prior to Visit  Medication Sig Dispense Refill   aspirin 81 MG tablet Take 81 mg by mouth daily.     betamethasone  dipropionate 0.05 % cream Apply topically 2 (two) times daily as needed.     Cinnamon 500 MG TABS Take by mouth.     Coenzyme Q10 (COQ10) 100 MG CAPS Take by mouth daily.     cyanocobalamin (VITAMIN B12) 500 MCG tablet Take 500 mcg by mouth daily.     famotidine  (PEPCID ) 40 MG tablet TAKE 1 TABLET BY MOUTH EVERY DAY 90 tablet 1   loratadine (CLARITIN) 10 MG tablet Take 10 mg by mouth daily.     Multiple Vitamin (MULTIVITAMIN) tablet Take 1 tablet by mouth daily.     pantoprazole  (PROTONIX ) 40 MG tablet TAKE 1 TABLET BY MOUTH DAILY 30-60 MIN BEFORE FIRST MEAL OF THE DAY 90 tablet 0   rosuvastatin  (CRESTOR ) 5 MG tablet TAKE 1 TABLET BY MOUTH EVERY DAY 90 tablet 1   S-Adenosylmethionine (SAM-E) 200 MG TABS Take by mouth daily.     Saw Palmetto 450 MG CAPS Take by mouth daily.     Turmeric 500 MG CAPS Take by mouth daily.     No current facility-administered medications on file prior to visit.     Review of Systems     Objective:  There were no vitals filed for this visit. BP Readings from Last 3 Encounters:  09/01/23 132/74  09/01/23 132/74  05/19/23 136/84   Wt Readings from Last 3 Encounters:  09/01/23 163 lb (73.9 kg)  09/01/23 163 lb (73.9 kg)  05/19/23 164 lb 6.4 oz (74.6 kg)   There is no height or weight on file to calculate BMI.    Physical Exam     Lab Results  Component Value Date   WBC 6.7 02/25/2024   HGB 13.8 02/25/2024   HCT 39.2 02/25/2024   PLT 197.0 02/25/2024   GLUCOSE 148 (H) 02/25/2024   CHOL 99 02/25/2024   TRIG 58.0 02/25/2024    HDL 42.80 02/25/2024   LDLCALC 45 02/25/2024   ALT 14 02/25/2024   AST 18 02/25/2024   NA 140 02/25/2024   K 4.9 02/25/2024   CL 104 02/25/2024   CREATININE 1.33 02/25/2024   BUN 15 02/25/2024   CO2 30 02/25/2024   TSH 4.20 01/27/2017   HGBA1C 7.0 (H) 02/25/2024   MICROALBUR 1.0 08/25/2023     Assessment & Plan:    See Problem List for Assessment and Plan of chronic medical problems.

## 2024-02-29 NOTE — Patient Instructions (Addendum)
    Flu immunization administered today.      Blood work was ordered for your next visit.       Medications changes include :   None     Return in about 6 months (around 08/31/2024) for follow up, schedule lab appt 1 week prior.

## 2024-03-03 ENCOUNTER — Ambulatory Visit: Admitting: Internal Medicine

## 2024-03-03 ENCOUNTER — Encounter: Payer: Self-pay | Admitting: Internal Medicine

## 2024-03-03 VITALS — BP 130/74 | HR 80 | Temp 98.4°F | Ht 71.0 in | Wt 161.0 lb

## 2024-03-03 DIAGNOSIS — Z794 Long term (current) use of insulin: Secondary | ICD-10-CM

## 2024-03-03 DIAGNOSIS — I700 Atherosclerosis of aorta: Secondary | ICD-10-CM

## 2024-03-03 DIAGNOSIS — Z23 Encounter for immunization: Secondary | ICD-10-CM

## 2024-03-03 DIAGNOSIS — N1831 Chronic kidney disease, stage 3a: Secondary | ICD-10-CM | POA: Diagnosis not present

## 2024-03-03 DIAGNOSIS — J8410 Pulmonary fibrosis, unspecified: Secondary | ICD-10-CM

## 2024-03-03 DIAGNOSIS — K219 Gastro-esophageal reflux disease without esophagitis: Secondary | ICD-10-CM | POA: Diagnosis not present

## 2024-03-03 DIAGNOSIS — D649 Anemia, unspecified: Secondary | ICD-10-CM

## 2024-03-03 DIAGNOSIS — E1122 Type 2 diabetes mellitus with diabetic chronic kidney disease: Secondary | ICD-10-CM

## 2024-03-03 DIAGNOSIS — E785 Hyperlipidemia, unspecified: Secondary | ICD-10-CM | POA: Diagnosis not present

## 2024-03-03 DIAGNOSIS — E1169 Type 2 diabetes mellitus with other specified complication: Secondary | ICD-10-CM | POA: Diagnosis not present

## 2024-03-03 NOTE — Assessment & Plan Note (Signed)
 Chronic Lab Results  Component Value Date   LDLCALC 45 02/25/2024   Cholesterol very well-controlled Continue Crestor  5 mg daily

## 2024-03-03 NOTE — Assessment & Plan Note (Addendum)
 Chronic With hyperlipidemia and chronic kidney disease stage III AA  Lab Results  Component Value Date   HGBA1C 7.0 (H) 02/25/2024   Sugars adequately controlled for his age Testing sugars 1 times a day Continue lifestyle control Encouraged cutting back on sweets Continue regular exercise

## 2024-03-03 NOTE — Assessment & Plan Note (Signed)
 Chronic LDL well controlled Lab Results  Component Value Date   LDLCALC 45 02/25/2024   Continue Crestor  5 mg daily Continue healthy diet, regular exercise

## 2024-03-03 NOTE — Assessment & Plan Note (Signed)
 Chronic Denies GERD-never really had reflux, but had cough and decreased lung function Continue pantoprazole 40 mg daily, famotidine 40 mg daily

## 2024-03-03 NOTE — Assessment & Plan Note (Signed)
 Chronic Stage IIIa Kidney function is stable and mild

## 2024-03-03 NOTE — Assessment & Plan Note (Signed)
History of anemia No anemia with most recent blood work MCV slightly elevated - he has been taking a B12 daily - continue Continue multivitamin Will continue to monitor

## 2024-04-21 ENCOUNTER — Other Ambulatory Visit (HOSPITAL_BASED_OUTPATIENT_CLINIC_OR_DEPARTMENT_OTHER): Payer: Self-pay

## 2024-04-21 DIAGNOSIS — Z23 Encounter for immunization: Secondary | ICD-10-CM | POA: Diagnosis not present

## 2024-04-21 MED ORDER — COMIRNATY 30 MCG/0.3ML IM SUSY
0.3000 mL | PREFILLED_SYRINGE | Freq: Once | INTRAMUSCULAR | 0 refills | Status: AC
  Filled 2024-04-21: qty 0.3, 1d supply, fill #0

## 2024-04-22 ENCOUNTER — Other Ambulatory Visit (HOSPITAL_BASED_OUTPATIENT_CLINIC_OR_DEPARTMENT_OTHER): Payer: Self-pay

## 2024-05-03 ENCOUNTER — Other Ambulatory Visit: Payer: Self-pay | Admitting: Internal Medicine

## 2024-05-11 ENCOUNTER — Other Ambulatory Visit: Payer: Self-pay | Admitting: Internal Medicine

## 2024-05-11 NOTE — Telephone Encounter (Signed)
Pt will need appt for further refills.  

## 2024-06-06 ENCOUNTER — Other Ambulatory Visit: Payer: Self-pay | Admitting: Internal Medicine

## 2024-06-29 ENCOUNTER — Other Ambulatory Visit: Payer: Self-pay | Admitting: Internal Medicine

## 2024-07-23 ENCOUNTER — Other Ambulatory Visit: Payer: Self-pay | Admitting: Internal Medicine

## 2024-08-26 ENCOUNTER — Other Ambulatory Visit

## 2024-08-31 ENCOUNTER — Ambulatory Visit: Admitting: Internal Medicine

## 2024-09-07 ENCOUNTER — Ambulatory Visit

## 2024-09-07 ENCOUNTER — Encounter: Admitting: Internal Medicine
